# Patient Record
Sex: Female | Born: 1940
Health system: Southern US, Community
[De-identification: ages and names within clinical notes are randomized; demographics above are authoritative.]

## PROBLEM LIST (undated history)

## (undated) DIAGNOSIS — E559 Vitamin D deficiency, unspecified: Secondary | ICD-10-CM

## (undated) DIAGNOSIS — J302 Other seasonal allergic rhinitis: Secondary | ICD-10-CM

## (undated) DIAGNOSIS — G629 Polyneuropathy, unspecified: Secondary | ICD-10-CM

## (undated) DIAGNOSIS — M1990 Unspecified osteoarthritis, unspecified site: Secondary | ICD-10-CM

## (undated) DIAGNOSIS — E039 Hypothyroidism, unspecified: Secondary | ICD-10-CM

## (undated) DIAGNOSIS — H353190 Nonexudative age-related macular degeneration, unspecified eye, stage unspecified: Secondary | ICD-10-CM

## (undated) HISTORY — DX: Nonexudative age-related macular degeneration, unspecified eye, stage unspecified: H35.3190

## (undated) HISTORY — DX: Polyneuropathy, unspecified: G62.9

## (undated) HISTORY — DX: Other seasonal allergic rhinitis: J30.2

## (undated) HISTORY — PX: OTHER SURGICAL HISTORY: SHX169

## (undated) HISTORY — DX: Unspecified osteoarthritis, unspecified site: M19.90

## (undated) HISTORY — DX: Vitamin D deficiency, unspecified: E55.9

## (undated) HISTORY — DX: Hypothyroidism, unspecified: E03.9

---

## 2000-05-11 ENCOUNTER — Other Ambulatory Visit: Admission: RE | Admit: 2000-05-11 | Discharge: 2000-05-11 | Payer: Self-pay | Admitting: Family Medicine

## 2000-08-23 ENCOUNTER — Encounter: Payer: Self-pay | Admitting: Family Medicine

## 2000-08-23 ENCOUNTER — Ambulatory Visit (HOSPITAL_COMMUNITY): Admission: RE | Admit: 2000-08-23 | Discharge: 2000-08-23 | Payer: Self-pay | Admitting: Family Medicine

## 2001-06-20 ENCOUNTER — Ambulatory Visit (HOSPITAL_COMMUNITY): Admission: RE | Admit: 2001-06-20 | Discharge: 2001-06-20 | Payer: Self-pay | Admitting: Family Medicine

## 2001-06-20 ENCOUNTER — Encounter: Payer: Self-pay | Admitting: Family Medicine

## 2001-09-06 ENCOUNTER — Ambulatory Visit (HOSPITAL_COMMUNITY): Admission: RE | Admit: 2001-09-06 | Discharge: 2001-09-06 | Payer: Self-pay | Admitting: Family Medicine

## 2001-09-06 ENCOUNTER — Encounter: Payer: Self-pay | Admitting: Family Medicine

## 2002-07-09 ENCOUNTER — Encounter: Payer: Self-pay | Admitting: Internal Medicine

## 2002-07-09 ENCOUNTER — Ambulatory Visit (HOSPITAL_COMMUNITY): Admission: RE | Admit: 2002-07-09 | Discharge: 2002-07-09 | Payer: Self-pay | Admitting: Internal Medicine

## 2002-09-09 ENCOUNTER — Encounter: Payer: Self-pay | Admitting: Family Medicine

## 2002-09-09 ENCOUNTER — Ambulatory Visit (HOSPITAL_COMMUNITY): Admission: RE | Admit: 2002-09-09 | Discharge: 2002-09-09 | Payer: Self-pay | Admitting: Family Medicine

## 2003-07-16 ENCOUNTER — Ambulatory Visit (HOSPITAL_COMMUNITY): Admission: RE | Admit: 2003-07-16 | Discharge: 2003-07-16 | Payer: Self-pay | Admitting: Family Medicine

## 2003-09-30 ENCOUNTER — Ambulatory Visit (HOSPITAL_COMMUNITY): Admission: RE | Admit: 2003-09-30 | Discharge: 2003-09-30 | Payer: Self-pay | Admitting: Family Medicine

## 2004-08-25 ENCOUNTER — Ambulatory Visit (HOSPITAL_COMMUNITY): Admission: RE | Admit: 2004-08-25 | Discharge: 2004-08-25 | Payer: Self-pay | Admitting: Family Medicine

## 2004-10-25 ENCOUNTER — Ambulatory Visit (HOSPITAL_COMMUNITY): Admission: RE | Admit: 2004-10-25 | Discharge: 2004-10-25 | Payer: Self-pay | Admitting: Family Medicine

## 2005-09-27 ENCOUNTER — Ambulatory Visit (HOSPITAL_COMMUNITY): Admission: RE | Admit: 2005-09-27 | Discharge: 2005-09-27 | Payer: Self-pay | Admitting: General Surgery

## 2005-11-10 ENCOUNTER — Ambulatory Visit (HOSPITAL_COMMUNITY): Admission: RE | Admit: 2005-11-10 | Discharge: 2005-11-10 | Payer: Self-pay | Admitting: Family Medicine

## 2005-11-14 ENCOUNTER — Ambulatory Visit (HOSPITAL_COMMUNITY): Admission: RE | Admit: 2005-11-14 | Discharge: 2005-11-14 | Payer: Self-pay | Admitting: Urology

## 2006-05-08 ENCOUNTER — Ambulatory Visit: Payer: Self-pay | Admitting: Orthopedic Surgery

## 2006-12-04 ENCOUNTER — Ambulatory Visit (HOSPITAL_COMMUNITY): Admission: RE | Admit: 2006-12-04 | Discharge: 2006-12-04 | Payer: Self-pay | Admitting: Family Medicine

## 2007-12-05 ENCOUNTER — Ambulatory Visit (HOSPITAL_COMMUNITY): Admission: RE | Admit: 2007-12-05 | Discharge: 2007-12-05 | Payer: Self-pay | Admitting: Family Medicine

## 2009-01-09 ENCOUNTER — Ambulatory Visit (HOSPITAL_COMMUNITY): Admission: RE | Admit: 2009-01-09 | Discharge: 2009-01-09 | Payer: Self-pay | Admitting: Family Medicine

## 2010-02-04 ENCOUNTER — Ambulatory Visit (HOSPITAL_COMMUNITY)
Admission: RE | Admit: 2010-02-04 | Discharge: 2010-02-04 | Payer: Self-pay | Source: Home / Self Care | Attending: Family Medicine | Admitting: Family Medicine

## 2010-06-18 NOTE — H&P (Signed)
Nancy Williams, Nancy Williams                 ACCOUNT NO.:  [id number]   MEDICAL RECORD NO.:  [id number]          PATIENT TYPE:  AMB   LOCATION:                                FACILITY:  [facility]   PHYSICIAN:  Dalia Heading, M.D.  DATE OF BIRTH:  [birth year]   DATE OF ADMISSION:  DATE OF DISCHARGE:  LH                                HISTORY & PHYSICAL   CHIEF COMPLAINT:  Need for screening colonoscopy.   HISTORY OF PRESENT ILLNESS:  Patient is a 70 year old white female who is  referred for endoscopic evaluation.  She needs a colonoscopy for screening  purposes.  No abdominal pain, weight loss, nausea, vomiting, diarrhea,  constipation, melena, or hematochezia have been noted.  She has never had a  colonoscopy.  There is no family history of colon carcinoma.   Past medical history includes hypothyroidism.   PAST SURGICAL HISTORY:  Unremarkable.   CURRENT MEDICATIONS:  Synthroid, vitamin supplements, calcium supplements,  baby aspirin, Naprosyn p.r.n.   ALLERGIES:  No known drug allergies.   REVIEW OF SYSTEMS:  Noncontributory.   PHYSICAL EXAMINATION:  GENERAL:  Patient is a well-developed and well-  nourished white female in no acute distress.  LUNGS:  Clear to auscultation with equal breath sounds bilaterally.  HEART:  Regular rate and rhythm without S3, S4, or murmurs.  ABDOMEN:  Soft, nontender, nondistended.  No hepatosplenomegaly or masses  are noted.  RECTAL:  Deferred to the procedure.   IMPRESSION:  Need for screening colonoscopy.   PLAN:  Patient is scheduled for a colonoscopy on September 27, 2005.  The risks  and benefits of the procedure, including bleeding and perforation, were  fully explained to the patient, who gave informed consent.      Dalia Heading, M.D.  Electronically Signed     MAJ/MEDQ  D:  09/22/2005  T:  09/22/2005  Job:  [id number]   cc:   [facility]  Fax: [phone]   Kirk Ruths, M.D.  Fax: [phone]

## 2010-10-20 ENCOUNTER — Other Ambulatory Visit (HOSPITAL_COMMUNITY): Payer: Self-pay | Admitting: Family Medicine

## 2010-10-20 DIAGNOSIS — Z01419 Encounter for gynecological examination (general) (routine) without abnormal findings: Secondary | ICD-10-CM

## 2010-10-20 DIAGNOSIS — Z139 Encounter for screening, unspecified: Secondary | ICD-10-CM

## 2010-10-25 ENCOUNTER — Ambulatory Visit (HOSPITAL_COMMUNITY)
Admission: RE | Admit: 2010-10-25 | Discharge: 2010-10-25 | Disposition: A | Discharge status: Home / Self Care | Payer: Medicare Other | Source: Ambulatory Visit | Attending: Family Medicine | Admitting: Family Medicine

## 2010-10-25 DIAGNOSIS — Z780 Asymptomatic menopausal state: Secondary | ICD-10-CM | POA: Insufficient documentation

## 2010-10-25 DIAGNOSIS — Z13820 Encounter for screening for osteoporosis: Secondary | ICD-10-CM | POA: Insufficient documentation

## 2010-10-25 DIAGNOSIS — Z01419 Encounter for gynecological examination (general) (routine) without abnormal findings: Secondary | ICD-10-CM

## 2010-10-25 DIAGNOSIS — Z139 Encounter for screening, unspecified: Secondary | ICD-10-CM

## 2011-10-04 ENCOUNTER — Other Ambulatory Visit (HOSPITAL_COMMUNITY): Payer: Self-pay | Admitting: Internal Medicine

## 2011-10-04 DIAGNOSIS — R51 Headache: Secondary | ICD-10-CM

## 2011-10-05 ENCOUNTER — Encounter (HOSPITAL_COMMUNITY): Payer: Self-pay

## 2011-10-05 ENCOUNTER — Ambulatory Visit (HOSPITAL_COMMUNITY)
Admission: RE | Admit: 2011-10-05 | Discharge: 2011-10-05 | Disposition: A | Discharge status: Home / Self Care | Payer: Medicare Other | Source: Ambulatory Visit | Attending: Internal Medicine | Admitting: Internal Medicine

## 2011-10-05 DIAGNOSIS — R51 Headache: Secondary | ICD-10-CM | POA: Insufficient documentation

## 2011-10-05 DIAGNOSIS — S0993XA Unspecified injury of face, initial encounter: Secondary | ICD-10-CM | POA: Insufficient documentation

## 2011-10-05 DIAGNOSIS — W1809XA Striking against other object with subsequent fall, initial encounter: Secondary | ICD-10-CM | POA: Insufficient documentation

## 2011-10-06 ENCOUNTER — Other Ambulatory Visit (HOSPITAL_COMMUNITY): Payer: Medicare Other

## 2011-10-31 ENCOUNTER — Other Ambulatory Visit (HOSPITAL_COMMUNITY): Payer: Self-pay | Admitting: Family Medicine

## 2011-10-31 DIAGNOSIS — Z139 Encounter for screening, unspecified: Secondary | ICD-10-CM

## 2011-11-08 ENCOUNTER — Ambulatory Visit (HOSPITAL_COMMUNITY)
Admission: RE | Admit: 2011-11-08 | Discharge: 2011-11-08 | Disposition: A | Discharge status: Home / Self Care | Payer: Medicare Other | Source: Ambulatory Visit | Attending: Family Medicine | Admitting: Family Medicine

## 2011-11-08 DIAGNOSIS — Z139 Encounter for screening, unspecified: Secondary | ICD-10-CM

## 2011-11-08 DIAGNOSIS — Z1231 Encounter for screening mammogram for malignant neoplasm of breast: Secondary | ICD-10-CM | POA: Insufficient documentation

## 2012-02-16 ENCOUNTER — Ambulatory Visit (HOSPITAL_COMMUNITY)
Admission: RE | Admit: 2012-02-16 | Discharge: 2012-02-16 | Disposition: A | Discharge status: Home / Self Care | Payer: Medicare Other | Source: Ambulatory Visit | Attending: Orthopedic Surgery | Admitting: Orthopedic Surgery

## 2012-02-16 DIAGNOSIS — M6281 Muscle weakness (generalized): Secondary | ICD-10-CM | POA: Insufficient documentation

## 2012-02-16 DIAGNOSIS — M25519 Pain in unspecified shoulder: Secondary | ICD-10-CM | POA: Insufficient documentation

## 2012-02-16 DIAGNOSIS — IMO0001 Reserved for inherently not codable concepts without codable children: Secondary | ICD-10-CM | POA: Insufficient documentation

## 2012-02-16 NOTE — Evaluation (Signed)
Occupational Therapy Evaluation  Patient Details  Name: Nancy Williams MRN: [id number] Date of Birth: [birth year]  Today's Date: 02/16/2012 Time: 1018-1100 OT Time Calculation (min): 42 min OT eval 1018-1100 42'  Visit#: 1  of 18   Re-eval: 03/15/12  Assessment Diagnosis: Right shoulder pain, arthritis, bone spur  Authorization: [employer]  Authorization Time Period: before 10th visit  Authorization Visit#: 1  of 10    Past Medical History: No past medical history on file. Past Surgical History: No past surgical history on file.  Subjective Symptoms/Limitations Symptoms: S: I've been having pain in my shoulder for a few weeks now. Pertinent History: Nancy Williams began to experience right shoulder pain since the first of September. She has since then had difficulty and increase pain with daily tasks. Dr. Chaney Malling has referred her to  occupational therapy to  eval and treat. Limitations: reaching out to the side, increased pain, difficulty getting shirt on, reaching up in cupboards in bothersome. Special Tests: UEFI = 67/80 (84%) Patient Stated Goals: to decrease pain/discomfort in right shoulder Pain Assessment Currently in Pain?: Yes Pain Score:   4 Pain Location: Shoulder Pain Orientation: Right Pain Type: Acute pain Pain Frequency: Intermittent  Precautions/Restrictions  Precautions Precautions: None  Prior Functioning  Home Living Lives With: Alone Enjoys line dancing, knitting, and painting.  Assessment ADL/Vision/Perception ADL ADL Comments: Experiencing discomfort when getting dressed and reaching into cupboards   Additional Assessments RUE Assessment RUE Assessment:  (standing) RUE AROM (degrees) Right Shoulder Flexion: 110 Degrees Right Shoulder ABduction: 91 Degrees Right Shoulder Internal Rotation: 33 Degrees (adducted) Right Shoulder External Rotation: 46 Degrees (adducted) RUE Strength Right Shoulder Flexion: 4/5 Right Shoulder ABduction: 4/5 Right  Shoulder Internal Rotation: 4/5 Right Shoulder External Rotation: 4/5 Grip (lbs): 50 LUE Assessment LUE Assessment:  (standing) LUE AROM (degrees) Left Shoulder Flexion: 154 Degrees Left Shoulder ABduction: 145 Degrees Left Shoulder Internal Rotation: 33 Degrees (adducted) Left Shoulder External Rotation: 80 Degrees (adducted) LUE Strength Left Shoulder Flexion: 3/5 Left Shoulder ABduction: 3-/5 Left Shoulder Internal Rotation: 3+/5 Left Shoulder External Rotation: 3+/5 Grip (lbs): 50 Palpation Palpation: Min fascial restrictions in elbow and shoulder region        Occupational Therapy Assessment and Plan OT Assessment and Plan Clinical Impression Statement: Patient is a 72 year old female with decreased mobility and strength in her right shoulder and increased pain and fascial restrictions which are causing decreased functional use of LUE with daily activities. Pt will benefit from skilled therapeutic intervention in order to improve on the following deficits: Decreased activity tolerance;Decreased range of motion;Decreased strength;Increased fascial restricitons;Impaired UE functional use Rehab Potential: Excellent OT Frequency: Min 3X/week OT Duration: 6 weeks OT Treatment/Interventions: Self-care/ADL training;Therapeutic exercise;Therapeutic activities;Manual therapy;Modalities;Patient/family education OT Plan: Skilled OT intervention to decrease pain and restrictions and increase pain free mobility in right shoulder for increased I with all B/IADLs, work, and leisure activities.  Treatment Plan:  MFR and manual stretching to right shoulder region.  PROM and AAROM in supine, progress to AROM. serratus anterior punch and proximal shoulder strengthening  seated ext, ret, row, ball stretches, pulleys, scapular stability exercises, progress as tolerated.     Goals Short Term Goals Time to Complete Short Term Goals: 3 weeks Short Term Goal 1: Patient will be educated on HEP. Short  Term Goal 2: Patient will increase right shoulder AROM by 10 for increased ability to comb hair. Short Term Goal 3: Patient will increase right shoulder strength to 4-/5 for increased ability to lift bags of  groceries. Short Term Goal 4: Patient will decrease pain in her right shoulder region to 2/10 when attempting to donn shirt. Short Term Goal 5: Patient will decrease fascial restrictions to trace in her right shoulder region. Long Term Goals Time to Complete Long Term Goals: 6 weeks Long Term Goal 1: Patient will return to prior level of I with all B/IADL, work, and leisure activities. Long Term Goal 2: Patient will increase right shoulder AROM to WNL for increased ability to comb her hair. Long Term Goal 3: Patient will increase left shoulder strength to 5/5 for increased ability to lift bags of groceries. Long Term Goal 4: Patient will decrease pain in her right shoulder region to 1/10 when attempting to donn shirt. Long Term Goal 5: Patient will decrease fascial restrictions to zero in her right shoulder region.  Problem List Patient Active Problem List  Diagnosis  . Pain in joint, shoulder region    End of Session Activity Tolerance: Patient tolerated treatment well General Behavior During Session: Nancy Williams for tasks performed Cognition: Nancy Williams for tasks performed OT Plan of Care OT Home Exercise Plan: cervical and shoulder stretches OT Patient Instructions: handout Consulted and Agree with Plan of Care: Patient  GO Functional Assessment Tool Used: observation Functional Limitation: Self care Self Care Current Status (Z6109): At least 20 percent but less than 40 percent impaired, limited or restricted Self Care Goal Status (U0454): At least 1 percent but less than 20 percent impaired, limited or restricted  Limmie Patricia, OTR/L 02/16/2012, 3:34 PM  Physician Documentation Your signature is required to indicate approval of the treatment plan as stated above.  Please sign and  either send electronically or make a copy of this report for your files and return this physician signed original.  Please mark one 1.__approve of plan  2. ___approve of plan with the following conditions.   ______________________________                                                          _____________________ Physician Signature                                                                                                             Date

## 2012-02-21 ENCOUNTER — Ambulatory Visit (HOSPITAL_COMMUNITY)
Admission: RE | Admit: 2012-02-21 | Discharge: 2012-02-21 | Disposition: A | Discharge status: Home / Self Care | Payer: Medicare Other | Source: Ambulatory Visit | Attending: Family Medicine | Admitting: Family Medicine

## 2012-02-21 DIAGNOSIS — M6281 Muscle weakness (generalized): Secondary | ICD-10-CM | POA: Insufficient documentation

## 2012-02-21 NOTE — Progress Notes (Signed)
Occupational Therapy Treatment Patient Details  Name: BERTRICE LEDER MRN: [id number] Date of Birth: [birth year]  Today's Date: 02/21/2012 Time: 7829-5621 OT Time Calculation (min): 36 min Manual Therapy 1310-1326 16' Therapeutic Exercises [phone] 20'  Visit#: 2  of 18   Re-eval: 03/15/12    Authorization: [employer]   Authorization Time Period: before 10th visit   Authorization Visit#: 2  of 10   Subjective S:  I tried the exercises and they are going ok.  It feels about the same. Pain Assessment Currently in Pain?: Yes Pain Score:   5 Pain Location: Shoulder Pain Orientation: Right Pain Type: Acute pain  Precautions/Restrictions   progress as tolerated  Exercise/Treatments Supine Protraction: PROM;AAROM;10 reps Horizontal ABduction: PROM;AAROM;10 reps External Rotation: PROM;AAROM;10 reps Internal Rotation: PROM;AAROM;10 reps Flexion: PROM;AAROM;10 reps ABduction: PROM;AAROM;10 reps Other Supine Exercises: serratus anterior punch x 10 with vg to slow rate of exercise Therapy Ball Flexion: 20 reps ABduction: 20 reps Right/Left: 5 reps ROM / Strengthening / Isometric Strengthening Thumb Tacks: 1' Prot/Ret//Elev/Dep: 1'      Manual Therapy Manual Therapy: Myofascial release Myofascial Release: MFR and manual stretching to right upper arm, scapular, trapezius, SCM, and shoulder region to decrease pain and fascial restrictions and increase pain free mobility in her right arm.  [id number]  Occupational Therapy Assessment and Plan OT Assessment and Plan Clinical Impression Statement: A:  Patient limited to 60% flexion, abduction and 75% ext rotation PROM in supine.  Requries vg with exercises for slowing rate of completion. OT Plan: P:  Add AAROM in seated, active scapular exercises, such as ext, ret, row, ER/IR for increased positioning in scapula for less pain in shoulder.    Goals Short Term Goals Time to Complete Short Term Goals: 3 weeks Short Term Goal 1:  Patient will be educated on HEP. Short Term Goal 1 Progress: Progressing toward goal Short Term Goal 2: Patient will increase right shoulder AROM by 10 for increased ability to comb hair. Short Term Goal 2 Progress: Progressing toward goal Short Term Goal 3: Patient will increase right shoulder strength to 4-/5 for increased ability to lift bags of groceries. Short Term Goal 3 Progress: Progressing toward goal Short Term Goal 4: Patient will decrease pain in her right shoulder region to 2/10 when attempting to donn shirt. Short Term Goal 4 Progress: Progressing toward goal Short Term Goal 5: Patient will decrease fascial restrictions to moderate in her right shoulder region. Short Term Goal 5 Progress: Progressing toward goal Long Term Goals Time to Complete Long Term Goals: 6 weeks Long Term Goal 1: Patient will return to prior level of I with all B/IADL, work, and leisure activities. Long Term Goal 1 Progress: Progressing toward goal Long Term Goal 2: Patient will increase right shoulder AROM to WNL for increased ability to comb her hair. Long Term Goal 2 Progress: Progressing toward goal Long Term Goal 3: Patient will increase left shoulder strength to 5/5 for increased ability to lift bags of groceries. Long Term Goal 3 Progress: Progressing toward goal Long Term Goal 4: Patient will decrease pain in her right shoulder region to 1/10 when attempting to donn shirt. Long Term Goal 4 Progress: Progressing toward goal Long Term Goal 5: Patient will decrease fascial restrictions to zero in her right shoulder region. Long Term Goal 5 Progress: Progressing toward goal  Problem List Patient Active Problem List  Diagnosis  . Pain in joint, shoulder region  . Muscle weakness (generalized)    End of Session Activity Tolerance:  Patient tolerated treatment well General Behavior During Session: [facility] for tasks performed Cognition: [facility] for tasks performed  GO    Shirlean Mylar,  OTR/L  02/21/2012, 1:52 PM

## 2012-02-23 ENCOUNTER — Ambulatory Visit (HOSPITAL_COMMUNITY)
Admission: RE | Admit: 2012-02-23 | Discharge: 2012-02-23 | Disposition: A | Discharge status: Home / Self Care | Payer: Medicare Other | Source: Ambulatory Visit | Attending: Orthopedic Surgery | Admitting: Orthopedic Surgery

## 2012-02-23 DIAGNOSIS — M6281 Muscle weakness (generalized): Secondary | ICD-10-CM

## 2012-02-23 NOTE — Progress Notes (Signed)
Occupational Therapy Treatment Patient Details  Name: Nancy Williams MRN: [id number] Date of Birth: 05/06/1940  Today's Date: 02/23/2012 Time: 4098-1191 OT Time Calculation (min): 48 min MFR 4782-9562 10' Therex 1308-6578 38'  Visit#: 3  of 18   Re-eval: 03/15/12    Authorization: [employer]  Authorization Time Period: before 10th visit   Authorization Visit#: 3  of 10   Subjective Symptoms/Limitations Symptoms: S: My shoulder is feeling better. Pain Assessment Currently in Pain?: Yes Pain Score:   3 Pain Location: Shoulder Pain Orientation: Right Pain Type: Acute pain  Precautions/Restrictions  Precautions Precautions: None  Exercise/Treatments Supine Protraction: PROM;AAROM;10 reps Horizontal ABduction: PROM;AAROM;10 reps External Rotation: PROM;AAROM;10 reps Internal Rotation: PROM;AAROM;10 reps Flexion: PROM;AAROM;10 reps ABduction: PROM;AAROM;10 reps Other Supine Exercises: serratus anterior punch x 10 with vg to slow rate of exercise Seated Elevation: AROM;10 reps Extension: AROM;10 reps Row: AROM;10 reps Protraction: AAROM;10 reps Horizontal ABduction: AAROM;10 reps External Rotation: AAROM;10 reps Internal Rotation: AAROM;10 reps Flexion: AAROM;10 reps Abduction: AAROM;10 reps Therapy Ball Flexion: 20 reps ABduction: 20 reps    Manual Therapy Manual Therapy: Myofascial release Myofascial Release: MFR and manual stretching to right upper arm, scapular, trapezius, SCM, and shoulder region to decrease pain and fascial restrictions and increase pain free mobility in her right arm.   Occupational Therapy Assessment and Plan OT Assessment and Plan Clinical Impression Statement: A: AROM in supine has increased. Required min physical assist for technique during exercises. OT Plan: P: Add elev/pro/retra/dep at wall.   Goals Short Term Goals Time to Complete Short Term Goals: 3 weeks Short Term Goal 1: Patient will be educated on HEP. Short Term Goal  2: Patient will increase right shoulder AROM by 10 for increased ability to comb hair. Short Term Goal 3: Patient will increase right shoulder strength to 4-/5 for increased ability to lift bags of groceries. Short Term Goal 4: Patient will decrease pain in her right shoulder region to 2/10 when attempting to donn shirt. Short Term Goal 5: Patient will decrease fascial restrictions to moderate in her right shoulder region. Long Term Goals Time to Complete Long Term Goals: 6 weeks Long Term Goal 1: Patient will return to prior level of I with all B/IADL, work, and leisure activities. Long Term Goal 2: Patient will increase right shoulder AROM to WNL for increased ability to comb her hair. Long Term Goal 3: Patient will increase left shoulder strength to 5/5 for increased ability to lift bags of groceries. Long Term Goal 4: Patient will decrease pain in her right shoulder region to 1/10 when attempting to donn shirt. Long Term Goal 5: Patient will decrease fascial restrictions to zero in her right shoulder region.  Problem List Patient Active Problem List  Diagnosis  . Pain in joint, shoulder region  . Muscle weakness (generalized)    End of Session Activity Tolerance: Patient tolerated treatment well General Behavior During Session: [facility] for tasks performed Cognition: [facility] for tasks performed   Limmie Patricia, OTR/L 02/23/2012, 11:53 AM

## 2012-02-27 ENCOUNTER — Ambulatory Visit (HOSPITAL_COMMUNITY)
Admission: RE | Admit: 2012-02-27 | Discharge: 2012-02-27 | Disposition: A | Discharge status: Home / Self Care | Payer: Medicare Other | Source: Ambulatory Visit | Attending: Orthopedic Surgery | Admitting: Orthopedic Surgery

## 2012-02-27 NOTE — Progress Notes (Signed)
Occupational Therapy Treatment Patient Details  Name: Nancy Williams MRN: [id number] Date of Birth: 01-25-1941  Today's Date: 02/27/2012 Time: 1020-1110 OT Time Calculation (min): 50 min Manual Therapy 1020-1050 30' Therapeutic Exercises 1050-1110 20' Visit#: 4  of 18   Re-eval: 03/15/12    Authorization: [employer]   Authorization Time Period: before 10th visit   Authorization Visit#: 4  of 10   Subjective  S:  It feels ok, what can I do to work this at home.  Educated on dowel rod exercises.    Precautions/Restrictions   progress as tolerated  Exercise/Treatments Supine Protraction: PROM;10 reps;AAROM;15 reps Horizontal ABduction: PROM;10 reps;AAROM;15 reps External Rotation: PROM;10 reps;AAROM;15 reps Internal Rotation: PROM;10 reps;AAROM;15 reps Flexion: PROM;10 reps;AAROM;15 reps ABduction: PROM;10 reps;AAROM;15 reps Seated Protraction: AROM;10 reps Horizontal ABduction: AROM;10 reps External Rotation: AAROM;15 reps Internal Rotation: AAROM;15 reps Flexion: AAROM;15 reps Abduction: AAROM;15 reps Therapy Ball Flexion: 25 reps ABduction: 25 reps ROM / Strengthening / Isometric Strengthening Thumb Tacks: 1' Prot/Ret//Elev/Dep: 1'      Manual Therapy Manual Therapy: Myofascial release Myofascial Release: MFR and manual stretching to right upper arm, scapular, trapezius, SCM, and shoulder region to decrease pain and fascial restrictions and increase pain free mobility in her right arm  Occupational Therapy Assessment and Plan OT Assessment and Plan Clinical Impression Statement: A:  AAROM in supine is improving. Pain and tightness at endrange (80% and above) of all supine shoulder movements.  OT Plan: P: Add AROM of protraction, horizontal abduction, Improve to 90% PROM.   Goals Short Term Goals Time to Complete Short Term Goals: 3 weeks Short Term Goal 1: Patient will be educated on HEP. Short Term Goal 1 Progress: Progressing toward goal Short Term Goal  2: Patient will increase right shoulder AROM by 10 for increased ability to comb hair. Short Term Goal 2 Progress: Progressing toward goal Short Term Goal 3: Patient will increase right shoulder strength to 4-/5 for increased ability to lift bags of groceries. Short Term Goal 3 Progress: Progressing toward goal Short Term Goal 4: Patient will decrease pain in her right shoulder region to 2/10 when attempting to donn shirt. Short Term Goal 4 Progress: Progressing toward goal Short Term Goal 5: Patient will decrease fascial restrictions to moderate in her right shoulder region. Short Term Goal 5 Progress: Progressing toward goal Long Term Goals Time to Complete Long Term Goals: 6 weeks Long Term Goal 1: Patient will return to prior level of I with all B/IADL, work, and leisure activities. Long Term Goal 1 Progress: Progressing toward goal Long Term Goal 2: Patient will increase right shoulder AROM to WNL for increased ability to comb her hair. Long Term Goal 2 Progress: Progressing toward goal Long Term Goal 3: Patient will increase left shoulder strength to 5/5 for increased ability to lift bags of groceries. Long Term Goal 3 Progress: Progressing toward goal Long Term Goal 4: Patient will decrease pain in her right shoulder region to 1/10 when attempting to donn shirt. Long Term Goal 4 Progress: Progressing toward goal Long Term Goal 5: Patient will decrease fascial restrictions to zero in her right shoulder region. Long Term Goal 5 Progress: Progressing toward goal  Problem List Patient Active Problem List  Diagnosis  . Pain in joint, shoulder region  . Muscle weakness (generalized)    End of Session Activity Tolerance: Patient tolerated treatment well General Behavior During Session: [facility] for tasks performed Cognition: [facility] for tasks performed  Shirlean Mylar, OTR/L  02/27/2012, 11:36 AM

## 2012-02-29 ENCOUNTER — Ambulatory Visit (HOSPITAL_COMMUNITY): Payer: Medicare Other | Admitting: Specialist

## 2012-03-02 ENCOUNTER — Ambulatory Visit (HOSPITAL_COMMUNITY)
Admission: RE | Admit: 2012-03-02 | Discharge: 2012-03-02 | Disposition: A | Discharge status: Home / Self Care | Payer: Medicare Other | Source: Ambulatory Visit | Attending: Orthopedic Surgery | Admitting: Orthopedic Surgery

## 2012-03-02 DIAGNOSIS — M6281 Muscle weakness (generalized): Secondary | ICD-10-CM

## 2012-03-02 NOTE — Progress Notes (Signed)
Occupational Therapy Treatment Patient Details  Name: Nancy Williams MRN: [id number] Date of Birth: March 25, 1940  Today's Date: 03/02/2012 Time: 1021-1059 OT Time Calculation (min): 38 min Manual Therapy 1021-1040 19' Therapeutic Exercises 1040-1059 19' Visit#: 5  of 18   Re-eval: 03/15/12    Authorization: [employer]  Authorization Time Period: before 10th visit  Authorization Visit#: 5  of 10   Subjective S:  Im using a yard stick at home, that helps.  Pain Assessment Currently in Pain?: No/denies Pain Score: 0-No pain  Precautions/Restrictions   progress as tolerated  Exercise/Treatments Supine Protraction: PROM;AROM;10 reps Horizontal ABduction: PROM;AROM;10 reps External Rotation: PROM;AROM;10 reps Internal Rotation: PROM;AROM;10 reps Flexion: PROM;AROM;10 reps ABduction: PROM;AROM;10 reps Seated Extension: Theraband;15 reps Theraband Level (Shoulder Extension): Level 2 (Red) Retraction: Theraband;15 reps Theraband Level (Shoulder Retraction): Level 2 (Red) Row: Theraband;15 reps Theraband Level (Shoulder Row): Level 2 (Red) Protraction: AROM;10 reps Horizontal ABduction: AROM;10 reps;Limitations Horizontal ABduction Limitations: vg and tactile cuing to maintain depressed shoulder blade External Rotation: AROM;10 reps;Theraband;15 reps Theraband Level (Shoulder External Rotation): Level 2 (Red) Internal Rotation: AROM;10 reps;Theraband;15 reps Theraband Level (Shoulder Internal Rotation): Level 2 (Red) Flexion: AAROM;15 reps Abduction: AAROM;15 reps Therapy Ball ABduction: 15 reps Right/Left: 5 reps ROM / Strengthening / Isometric Strengthening Wall Wash: 2' Thumb Tacks: dc Prot/Ret//Elev/Dep: dc      Manual Therapy Manual Therapy: Myofascial release Myofascial Release: MFR and manual stretching to right upper arm, scapular, trapezius, SCM, and shoulder region to decrease pain and fascial restrictions and increase pain free mobility in her right arm    Occupational Therapy Assessment and Plan OT Assessment and Plan Clinical Impression Statement: A:  Added AROM in supine all ranges, required assistance with holding weight of arm through abduction.  Also able to complete AROM in seated for protraction, horizontal abduction and ER/IR.   OT Plan: P:  Add flexion and abduction AROM in seated as AAROM improved to [facility].     Goals Short Term Goals Time to Complete Short Term Goals: 3 weeks Short Term Goal 1: Patient will be educated on HEP. Short Term Goal 2: Patient will increase right shoulder AROM by 10 for increased ability to comb hair. Short Term Goal 3: Patient will increase right shoulder strength to 4-/5 for increased ability to lift bags of groceries. Short Term Goal 4: Patient will decrease pain in her right shoulder region to 2/10 when attempting to donn shirt. Short Term Goal 5: Patient will decrease fascial restrictions to moderate in her right shoulder region. Long Term Goals Time to Complete Long Term Goals: 6 weeks Long Term Goal 1: Patient will return to prior level of I with all B/IADL, work, and leisure activities. Long Term Goal 2: Patient will increase right shoulder AROM to WNL for increased ability to comb her hair. Long Term Goal 3: Patient will increase left shoulder strength to 5/5 for increased ability to lift bags of groceries. Long Term Goal 4: Patient will decrease pain in her right shoulder region to 1/10 when attempting to donn shirt. Long Term Goal 5: Patient will decrease fascial restrictions to zero in her right shoulder region.  Problem List Patient Active Problem List  Diagnosis  . Pain in joint, shoulder region  . Muscle weakness (generalized)    End of Session Activity Tolerance: Patient tolerated treatment well General Behavior During Session: [facility] for tasks performed Cognition: [facility] for tasks performed   Shirlean Mylar, OTR/L  03/02/2012, 11:41 AM

## 2012-03-05 ENCOUNTER — Ambulatory Visit (HOSPITAL_COMMUNITY)
Admission: RE | Admit: 2012-03-05 | Discharge: 2012-03-05 | Disposition: A | Discharge status: Home / Self Care | Payer: Medicare Other | Source: Ambulatory Visit | Attending: Orthopedic Surgery | Admitting: Orthopedic Surgery

## 2012-03-05 DIAGNOSIS — IMO0001 Reserved for inherently not codable concepts without codable children: Secondary | ICD-10-CM | POA: Insufficient documentation

## 2012-03-05 DIAGNOSIS — M25519 Pain in unspecified shoulder: Secondary | ICD-10-CM | POA: Insufficient documentation

## 2012-03-05 DIAGNOSIS — M6281 Muscle weakness (generalized): Secondary | ICD-10-CM

## 2012-03-05 NOTE — Progress Notes (Signed)
Occupational Therapy Treatment Patient Details  Name: Nancy Williams MRN: [id number] Date of Birth: 1940-04-13  Today's Date: 03/05/2012 Time: 4098-1191 OT Time Calculation (min): 48 min Manual Therapy 4782-9562 14' Therapeutic Exercises [phone] 21' Visit#: 6  of 18   Re-eval: 03/15/12    Authorization: [employer]   Authorization Time Period: before 10th visit   Authorization Visit#: 6  of 10   Subjective Symptoms/Limitations Symptoms: S:  How do you think I am progressing.  Pain Assessment Currently in Pain?: No/denies Pain Score: 0-No pain  Precautions/Restrictions     Exercise/Treatments Supine Protraction: PROM;10 reps;AROM;15 reps Horizontal ABduction: PROM;10 reps;AROM;15 reps External Rotation: PROM;10 reps;AROM;15 reps Internal Rotation: 10 reps;PROM;AROM;15 reps Flexion: PROM;10 reps;AROM;15 reps ABduction: PROM;10 reps;AROM;15 reps Seated Extension: Theraband;10 reps Theraband Level (Shoulder Extension): Level 3 (Green) Retraction: Theraband;10 reps Theraband Level (Shoulder Retraction): Level 3 (Green) Row: Theraband;10 reps Theraband Level (Shoulder Row): Level 3 (Green) Protraction: AROM;15 reps Horizontal ABduction: AROM;15 reps External Rotation: AROM;15 reps;Theraband;10 reps Theraband Level (Shoulder External Rotation): Level 3 (Green) Internal Rotation: Theraband;10 reps;AROM;15 reps Theraband Level (Shoulder Internal Rotation): Level 3 (Green) Flexion: AROM;15 reps Abduction: AROM;15 reps;Limitations ABduction Limitations: patient able to complete, but difficult Therapy Ball ABduction: 15 reps Right/Left: 5 reps ROM / Strengthening / Isometric Strengthening Wall Wash: 2' Thumb Tacks: 3'      Manual Therapy Manual Therapy: Myofascial release Myofascial Release: MFR and manual stretching to right upper arm, scapular, trapezius, SCM, and shoulder region to decrease pain and fascial restrictions and increase pain free mobility in her right  arm  Occupational Therapy Assessment and Plan OT Assessment and Plan Clinical Impression Statement: A:  Increased to green tband this date, as red tband was not challenging.  Able to complete AROM exercises in supine through full range, except for ER and abd. OT Plan: P:  Attempt strengthening in supine, add x t o v and w arms for scapular stabilization   Goals Short Term Goals Time to Complete Short Term Goals: 3 weeks Short Term Goal 1: Patient will be educated on HEP. Short Term Goal 1 Progress: Progressing toward goal Short Term Goal 2: Patient will increase right shoulder AROM by 10 for increased ability to comb hair. Short Term Goal 2 Progress: Progressing toward goal Short Term Goal 3: Patient will increase right shoulder strength to 4-/5 for increased ability to lift bags of groceries. Short Term Goal 3 Progress: Progressing toward goal Short Term Goal 4: Patient will decrease pain in her right shoulder region to 2/10 when attempting to donn shirt. Short Term Goal 4 Progress: Progressing toward goal Short Term Goal 5: Patient will decrease fascial restrictions to moderate in her right shoulder region. Short Term Goal 5 Progress: Progressing toward goal Long Term Goals Time to Complete Long Term Goals: 6 weeks Long Term Goal 1: Patient will return to prior level of I with all B/IADL, work, and leisure activities. Long Term Goal 1 Progress: Progressing toward goal Long Term Goal 2: Patient will increase right shoulder AROM to WNL for increased ability to comb her hair. Long Term Goal 2 Progress: Progressing toward goal Long Term Goal 3: Patient will increase left shoulder strength to 5/5 for increased ability to lift bags of groceries. Long Term Goal 3 Progress: Progressing toward goal Long Term Goal 4: Patient will decrease pain in her right shoulder region to 1/10 when attempting to donn shirt. Long Term Goal 4 Progress: Progressing toward goal Long Term Goal 5: Patient will  decrease fascial restrictions to  zero in her right shoulder region. Long Term Goal 5 Progress: Progressing toward goal  Problem List Patient Active Problem List  Diagnosis  . Pain in joint, shoulder region  . Muscle weakness (generalized)    End of Session Activity Tolerance: Patient tolerated treatment well General Behavior During Session: [facility] for tasks performed Cognition: [facility] for tasks performed  GO    Shirlean Mylar, OTR/L  03/05/2012, 11:25 AM

## 2012-03-07 ENCOUNTER — Ambulatory Visit (HOSPITAL_COMMUNITY): Payer: Medicare Other | Admitting: Specialist

## 2012-03-09 ENCOUNTER — Ambulatory Visit (HOSPITAL_COMMUNITY)
Admission: RE | Admit: 2012-03-09 | Discharge: 2012-03-09 | Disposition: A | Discharge status: Home / Self Care | Payer: Medicare Other | Source: Ambulatory Visit | Attending: Orthopedic Surgery | Admitting: Orthopedic Surgery

## 2012-03-09 DIAGNOSIS — M6281 Muscle weakness (generalized): Secondary | ICD-10-CM

## 2012-03-09 NOTE — Progress Notes (Signed)
Occupational Therapy Treatment Patient Details  Name: Nancy Williams MRN: [id number] Date of Birth: [birth year]  Today's Date: 03/09/2012 Time: 4098-1191 OT Time Calculation (min): 50 min Manual Therapy 4782-9562 25' Therapeutic Exercises 1308-6578 [phone] no charge secondary to supervised by OTT) 11' Visit#: 7  of 18   Re-eval: 03/15/12    Authorization: [employer]   Authorization Time Period: before 10th visit   Authorization Visit#: 7  of 10   Subjective S:  I really just have pain at night, it doesnt wake me up, just makes it difficult to get to sleep. (I recommended heating and positioning with a pillow prior to going to sleep) Pain Assessment Currently in Pain?: No/denies Pain Score: 0-No pain  Precautions/Restrictions   progress as tolerated  Exercise/Treatments Supine Protraction: PROM;10 reps Horizontal ABduction: PROM;10 reps External Rotation: PROM;10 reps Internal Rotation: PROM;10 reps Flexion: PROM;10 reps ABduction: PROM;10 reps Seated Extension: Theraband;10 reps Theraband Level (Shoulder Extension): Level 3 (Green) Retraction: Theraband;10 reps Theraband Level (Shoulder Retraction): Level 3 (Green) Row: Theraband;10 reps Theraband Level (Shoulder Row): Level 3 (Green) Protraction: AROM;15 reps Horizontal ABduction: AROM;15 reps External Rotation: AROM;15 reps;Theraband;10 reps Theraband Level (Shoulder External Rotation): Level 3 (Green) Internal Rotation: Theraband;10 reps;AROM;15 reps Theraband Level (Shoulder Internal Rotation): Level 3 (Green) Flexion: AROM;15 reps Abduction: AROM;15 reps;Limitations ABduction Limitations: patient able to complete, but difficult Therapy Ball ABduction: 15 reps Right/Left: 5 reps ROM / Strengthening / Isometric Strengthening UBE (Upper Arm Bike): 2' and 2' 1.0 Wall Wash: 2' Thumb Tacks: dc Prot/Ret//Elev/Dep: dc      Manual Therapy Manual Therapy: Myofascial release Myofascial Release: MFR and manual  stretching to right upper arm, scapular, trapezius, SCM, and shoulder region to decrease pain and fascial restrictions and increase pain free mobility in her right arm   Occupational Therapy Assessment and Plan OT Assessment and Plan Clinical Impression Statement: A:  Continues to require facilitation for full retraction in scapula with exercises.  Added UBE for increased mobility in her right shoulder region.  OT Plan: P:  Attempt strengthening in supine.  follow up on use of heat and positioning at night for pain relief.   Goals Short Term Goals Time to Complete Short Term Goals: 3 weeks Short Term Goal 1: Patient will be educated on HEP. Short Term Goal 2: Patient will increase right shoulder AROM by 10 for increased ability to comb hair. Short Term Goal 3: Patient will increase right shoulder strength to 4-/5 for increased ability to lift bags of groceries. Short Term Goal 4: Patient will decrease pain in her right shoulder region to 2/10 when attempting to donn shirt. Short Term Goal 5: Patient will decrease fascial restrictions to moderate in her right shoulder region. Long Term Goals Time to Complete Long Term Goals: 6 weeks Long Term Goal 1: Patient will return to prior level of I with all B/IADL, work, and leisure activities. Long Term Goal 2: Patient will increase right shoulder AROM to WNL for increased ability to comb her hair. Long Term Goal 3: Patient will increase left shoulder strength to 5/5 for increased ability to lift bags of groceries. Long Term Goal 4: Patient will decrease pain in her right shoulder region to 1/10 when attempting to donn shirt. Long Term Goal 5: Patient will decrease fascial restrictions to zero in her right shoulder region.  Problem List Patient Active Problem List  Diagnosis  . Pain in joint, shoulder region  . Muscle weakness (generalized)    End of Session Activity Tolerance: Patient tolerated  treatment well General Behavior During Session:  [facility] for tasks performed Cognition: [facility] for tasks performed  GO    Shirlean Mylar, OTR/L  03/09/2012, 12:01 PM

## 2012-03-12 ENCOUNTER — Ambulatory Visit (HOSPITAL_COMMUNITY)
Admission: RE | Admit: 2012-03-12 | Discharge: 2012-03-12 | Disposition: A | Discharge status: Home / Self Care | Payer: Medicare Other | Source: Ambulatory Visit | Attending: Orthopedic Surgery | Admitting: Orthopedic Surgery

## 2012-03-12 DIAGNOSIS — M6281 Muscle weakness (generalized): Secondary | ICD-10-CM

## 2012-03-12 NOTE — Progress Notes (Signed)
Occupational Therapy Treatment Patient Details  Name: Nancy Williams MRN: [id number] Date of Birth: [birth year]  Today's Date: 03/12/2012 Time: 1012-1100 OT Time Calculation (min): 48 min Manual Therapy 4098-1191 19' Therapeutic Exercises 1031-1100 29' Visit#: 8 of 18  Re-eval: 03/15/12    Authorization: [employer]   Authorization Time Period: before 10th visit   Authorization Visit#: 8 of 10  Subjective  S:  I painted this weekend, it didnt bother my shoulder too much.  I think the heating pad is working.   Precautions/Restrictions   progress as tolerated  Exercise/Treatments Supine Protraction: PROM;10 reps Horizontal ABduction: PROM;10 reps;AAROM;5 reps External Rotation: PROM;10 reps;AAROM;5 reps Internal Rotation: PROM;10 reps;AAROM;5 reps Flexion: PROM;10 reps;AAROM;5 reps ABduction: PROM;10 reps;AAROM;5 reps Therapy Ball Flexion: 20 reps ABduction: 20 reps Right/Left: 5 reps ROM / Strengthening / Isometric Strengthening UBE (Upper Arm Bike): 2' and 2' 1.0 Wall Wash: 2' focusing on stretching into end range of shoulder  "W" Arms: 10 with vg for technique X to V Arms: 10 with vg for techique      Manual Therapy Manual Therapy: Myofascial release Myofascial Release: MFR and manual stretching to right upper arm, shoulder region, scapular region, trapezius region to decrease pain and fascial restriction and increase pain free mobility in her right upper extremity.  [id number]  Occupational Therapy Assessment and Plan OT Assessment and Plan Clinical Impression Statement: A:  Patient with increased pain and stiffness in lateral upper arm this date, shfited focus to stretching, muscle lengthening rather than strengthening as we had been focusing on in attempts to regain lost mobility. OT Plan: P:  Reassess, begin strengthening in supine and shift focus back to scapular stability and strengthening if tightness resolves.   Goals Short Term Goals Time to Complete Short  Term Goals: 3 weeks Short Term Goal 1: Patient will be educated on HEP. Short Term Goal 1 Progress: Progressing toward goal Short Term Goal 2: Patient will increase right shoulder AROM by 10 for increased ability to comb hair. Short Term Goal 2 Progress: Progressing toward goal Short Term Goal 3: Patient will increase right shoulder strength to 4-/5 for increased ability to lift bags of groceries. Short Term Goal 3 Progress: Progressing toward goal Short Term Goal 4: Patient will decrease pain in her right shoulder region to 2/10 when attempting to donn shirt. Short Term Goal 4 Progress: Progressing toward goal Short Term Goal 5: Patient will decrease fascial restrictions to moderate in her right shoulder region. Long Term Goals Time to Complete Long Term Goals: 6 weeks Long Term Goal 1: Patient will return to prior level of I with all B/IADL, work, and leisure activities. Long Term Goal 1 Progress: Progressing toward goal Long Term Goal 2: Patient will increase right shoulder AROM to WNL for increased ability to comb her hair. Long Term Goal 2 Progress: Progressing toward goal Long Term Goal 3: Patient will increase left shoulder strength to 5/5 for increased ability to lift bags of groceries. Long Term Goal 3 Progress: Progressing toward goal Long Term Goal 4: Patient will decrease pain in her right shoulder region to 1/10 when attempting to donn shirt. Long Term Goal 4 Progress: Progressing toward goal Long Term Goal 5: Patient will decrease fascial restrictions to zero in her right shoulder region. Long Term Goal 5 Progress: Progressing toward goal  Problem List Patient Active Problem List  Diagnosis  . Pain in joint, shoulder region  . Muscle weakness (generalized)   Shirlean Mylar, OTR/L   03/12/2012, 11:01 AM

## 2012-03-14 ENCOUNTER — Ambulatory Visit (HOSPITAL_COMMUNITY): Payer: Medicare Other

## 2012-03-16 ENCOUNTER — Ambulatory Visit (HOSPITAL_COMMUNITY): Payer: Medicare Other

## 2012-03-19 ENCOUNTER — Ambulatory Visit (HOSPITAL_COMMUNITY)
Admission: RE | Admit: 2012-03-19 | Discharge: 2012-03-19 | Disposition: A | Discharge status: Home / Self Care | Payer: Medicare Other | Source: Ambulatory Visit | Attending: Orthopedic Surgery | Admitting: Orthopedic Surgery

## 2012-03-19 NOTE — Progress Notes (Addendum)
Occupational Therapy Treatment Patient Details  Name: Nancy Williams MRN: [id number] Date of Birth: [birth year]  Today's Date: 03/19/2012 Time: 8413-2440 OT Time Calculation (min): 33 min Manual Therapy [phone] 16' Reassessment [phone] Visit#:   of    Re-eval: 04/16/12    Authorization: [employer]   Authorization Time Period: before 19th visit  Authorization Visit#:   of    Subjective Symptoms/Limitations Symptoms: S:  I feel like I can move my arm better, but the pain is still there. Special Tests: UEFI was 84% and is currently 96%.   Pain Assessment Currently in Pain?: Yes Pain Score:   4 Pain Location: Shoulder Pain Orientation: Right Pain Type: Acute pain  Precautions/Restrictions   progress as tolerated  Exercise/Treatments    Manual Therapy Manual Therapy: Myofascial release Myofascial Release: MFR and manual stretching to right upper arm, shoulder region, trapezius region to decrease pain and fascial restrictions and increase pain fee mobility in her right upper extremity.   Occupational Therapy Assessment and Plan OT Assessment and Plan Clinical Impression Statement: please see progress note.  OT Plan: P:  Begin strengthening in supine and focus on scapular stability.    Update - after patient went to MD, he recommended DC to HEP.   Goals Short Term Goals Time to Complete Short Term Goals: 3 weeks Short Term Goal 1: Patient will be educated on HEP. Short Term Goal 2: Patient will increase right shoulder AROM by 10 for increased ability to comb hair. Short Term Goal 3: Patient will increase right shoulder strength to 4-/5 for increased ability to lift bags of groceries. Short Term Goal 4: Patient will decrease pain in her right shoulder region to 2/10 when attempting to donn shirt. Short Term Goal 5: Patient will decrease fascial restrictions to moderate in her right shoulder region. Long Term Goals Time to Complete Long Term Goals: 6 weeks Long Term Goal 1:  Patient will return to prior level of I with all B/IADL, work, and leisure activities. Long Term Goal 2: Patient will increase right shoulder AROM to WNL for increased ability to comb her hair. Long Term Goal 3: Patient will increase left shoulder strength to 5/5 for increased ability to lift bags of groceries. Long Term Goal 4: Patient will decrease pain in her right shoulder region to 1/10 when attempting to donn shirt. Long Term Goal 5: Patient will decrease fascial restrictions to zero in her right shoulder region.  Problem List Patient Active Problem List  Diagnosis  . Pain in joint, shoulder region  . Muscle weakness (generalized)       GO Functional Limitation: Self care Self Care Goal Status ([id number]): 0 percent impaired, limited or restricted Self Care Discharge Status [phone]): At least 1 percent but less than 20 percent impaired, limited or restricted  Shirlean Mylar, OTR/L  03/19/2012, 2:57 PM

## 2013-09-10 ENCOUNTER — Other Ambulatory Visit (HOSPITAL_COMMUNITY): Payer: Self-pay | Admitting: Family Medicine

## 2013-09-10 DIAGNOSIS — Z0000 Encounter for general adult medical examination without abnormal findings: Secondary | ICD-10-CM

## 2013-09-17 ENCOUNTER — Ambulatory Visit (HOSPITAL_COMMUNITY)
Admission: RE | Admit: 2013-09-17 | Discharge: 2013-09-17 | Disposition: A | Discharge status: Home / Self Care | Payer: Medicare (Managed Care) | Source: Ambulatory Visit | Attending: Family Medicine | Admitting: Family Medicine

## 2013-09-17 DIAGNOSIS — Z0000 Encounter for general adult medical examination without abnormal findings: Secondary | ICD-10-CM | POA: Diagnosis not present

## 2014-04-15 ENCOUNTER — Other Ambulatory Visit (HOSPITAL_COMMUNITY): Payer: Self-pay | Admitting: Family Medicine

## 2014-04-15 DIAGNOSIS — M5136 Other intervertebral disc degeneration, lumbar region: Secondary | ICD-10-CM

## 2014-04-15 DIAGNOSIS — M545 Low back pain: Secondary | ICD-10-CM

## 2014-04-15 DIAGNOSIS — Z1231 Encounter for screening mammogram for malignant neoplasm of breast: Secondary | ICD-10-CM | POA: Diagnosis not present

## 2014-04-15 DIAGNOSIS — M479 Spondylosis, unspecified: Secondary | ICD-10-CM | POA: Insufficient documentation

## 2014-04-15 DIAGNOSIS — Z139 Encounter for screening, unspecified: Secondary | ICD-10-CM

## 2014-04-16 ENCOUNTER — Ambulatory Visit (HOSPITAL_COMMUNITY)
Admission: RE | Admit: 2014-04-16 | Discharge: 2014-04-16 | Disposition: A | Discharge status: Home / Self Care | Payer: Medicare HMO | Source: Ambulatory Visit | Attending: Family Medicine | Admitting: Family Medicine

## 2014-04-16 DIAGNOSIS — M545 Low back pain: Secondary | ICD-10-CM

## 2014-04-16 DIAGNOSIS — M479 Spondylosis, unspecified: Secondary | ICD-10-CM | POA: Insufficient documentation

## 2014-04-16 DIAGNOSIS — Z1231 Encounter for screening mammogram for malignant neoplasm of breast: Secondary | ICD-10-CM | POA: Insufficient documentation

## 2014-04-16 DIAGNOSIS — M5136 Other intervertebral disc degeneration, lumbar region: Secondary | ICD-10-CM | POA: Insufficient documentation

## 2014-04-16 DIAGNOSIS — Z139 Encounter for screening, unspecified: Secondary | ICD-10-CM

## 2015-03-04 ENCOUNTER — Encounter (INDEPENDENT_AMBULATORY_CARE_PROVIDER_SITE_OTHER): Payer: Self-pay | Admitting: *Deleted

## 2015-03-24 DIAGNOSIS — R69 Illness, unspecified: Secondary | ICD-10-CM | POA: Diagnosis not present

## 2015-03-26 DIAGNOSIS — M79672 Pain in left foot: Secondary | ICD-10-CM | POA: Diagnosis not present

## 2015-03-26 DIAGNOSIS — B351 Tinea unguium: Secondary | ICD-10-CM | POA: Diagnosis not present

## 2015-03-26 DIAGNOSIS — M79671 Pain in right foot: Secondary | ICD-10-CM | POA: Diagnosis not present

## 2015-04-15 DIAGNOSIS — Z1389 Encounter for screening for other disorder: Secondary | ICD-10-CM | POA: Diagnosis not present

## 2015-04-15 DIAGNOSIS — J069 Acute upper respiratory infection, unspecified: Secondary | ICD-10-CM | POA: Diagnosis not present

## 2015-04-15 DIAGNOSIS — Z681 Body mass index (BMI) 19 or less, adult: Secondary | ICD-10-CM | POA: Diagnosis not present

## 2015-04-15 DIAGNOSIS — J209 Acute bronchitis, unspecified: Secondary | ICD-10-CM | POA: Diagnosis not present

## 2015-06-15 DIAGNOSIS — L570 Actinic keratosis: Secondary | ICD-10-CM | POA: Diagnosis not present

## 2015-06-15 DIAGNOSIS — Z85828 Personal history of other malignant neoplasm of skin: Secondary | ICD-10-CM | POA: Diagnosis not present

## 2015-07-17 DIAGNOSIS — J019 Acute sinusitis, unspecified: Secondary | ICD-10-CM | POA: Diagnosis not present

## 2015-07-17 DIAGNOSIS — J301 Allergic rhinitis due to pollen: Secondary | ICD-10-CM | POA: Diagnosis not present

## 2015-07-17 DIAGNOSIS — H6991 Unspecified Eustachian tube disorder, right ear: Secondary | ICD-10-CM | POA: Diagnosis not present

## 2015-07-17 DIAGNOSIS — Z681 Body mass index (BMI) 19 or less, adult: Secondary | ICD-10-CM | POA: Diagnosis not present

## 2015-08-13 IMAGING — CR DG LUMBAR SPINE 2-3V
3 series · 3 of 3 positions shown · non-contrast
Comparison: No priors.

CLINICAL DATA: 34-year-old female with intermittent low back pain
for the past several years, worsening over the past 3 months.

EXAM:
LUMBAR SPINE - 2-3 VIEW

[view not recorded (1 of 3)]
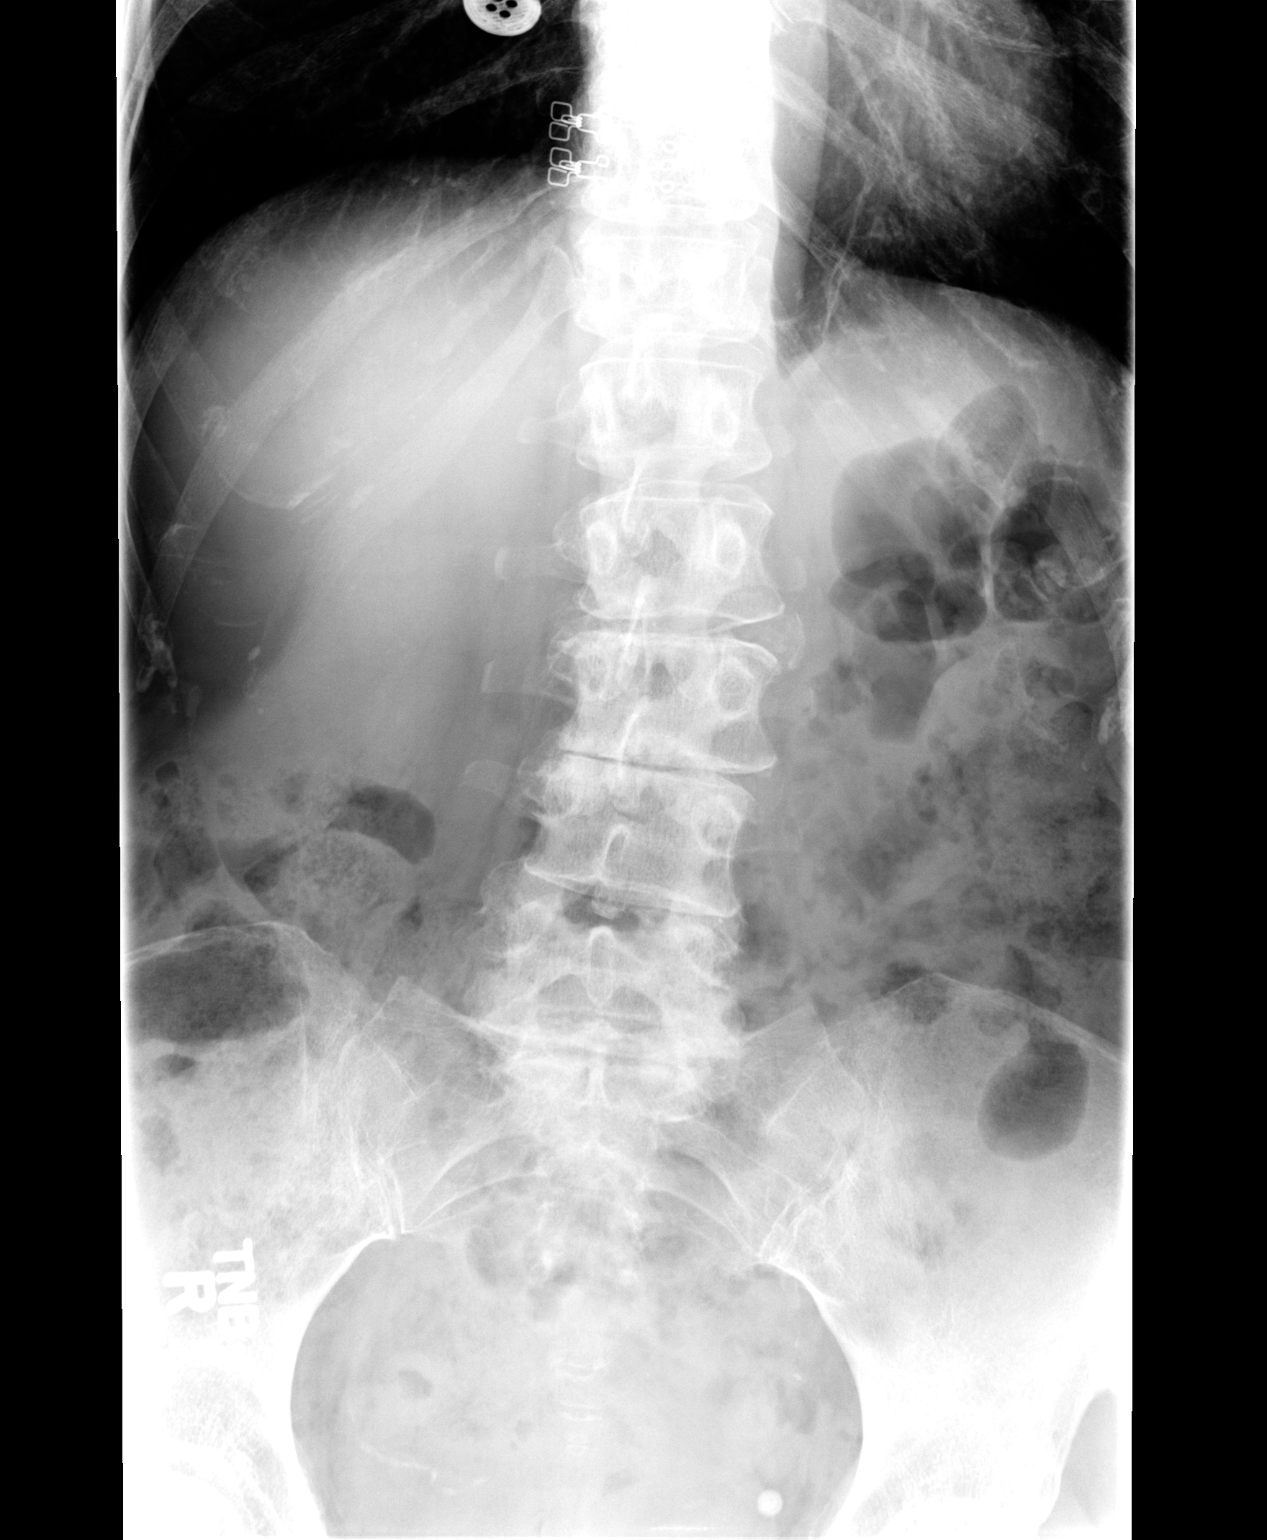

[view not recorded (2 of 3)]
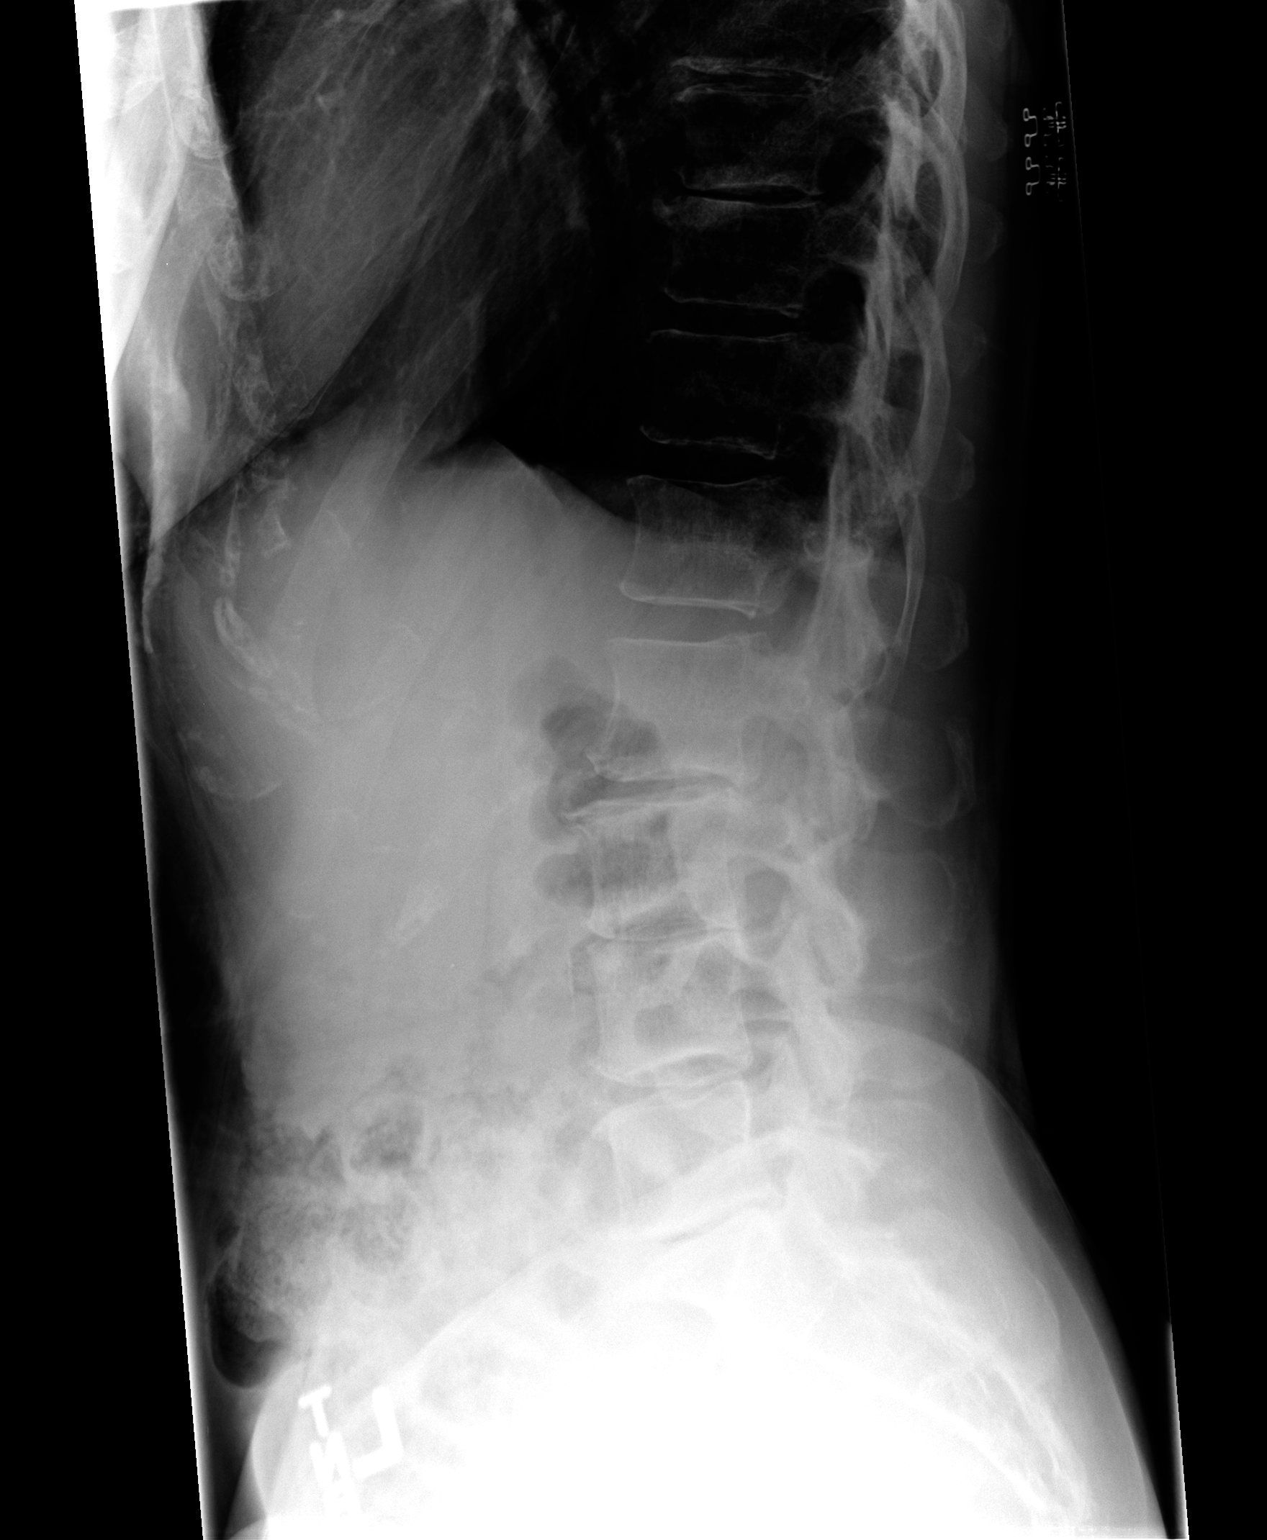

[view not recorded (3 of 3)]
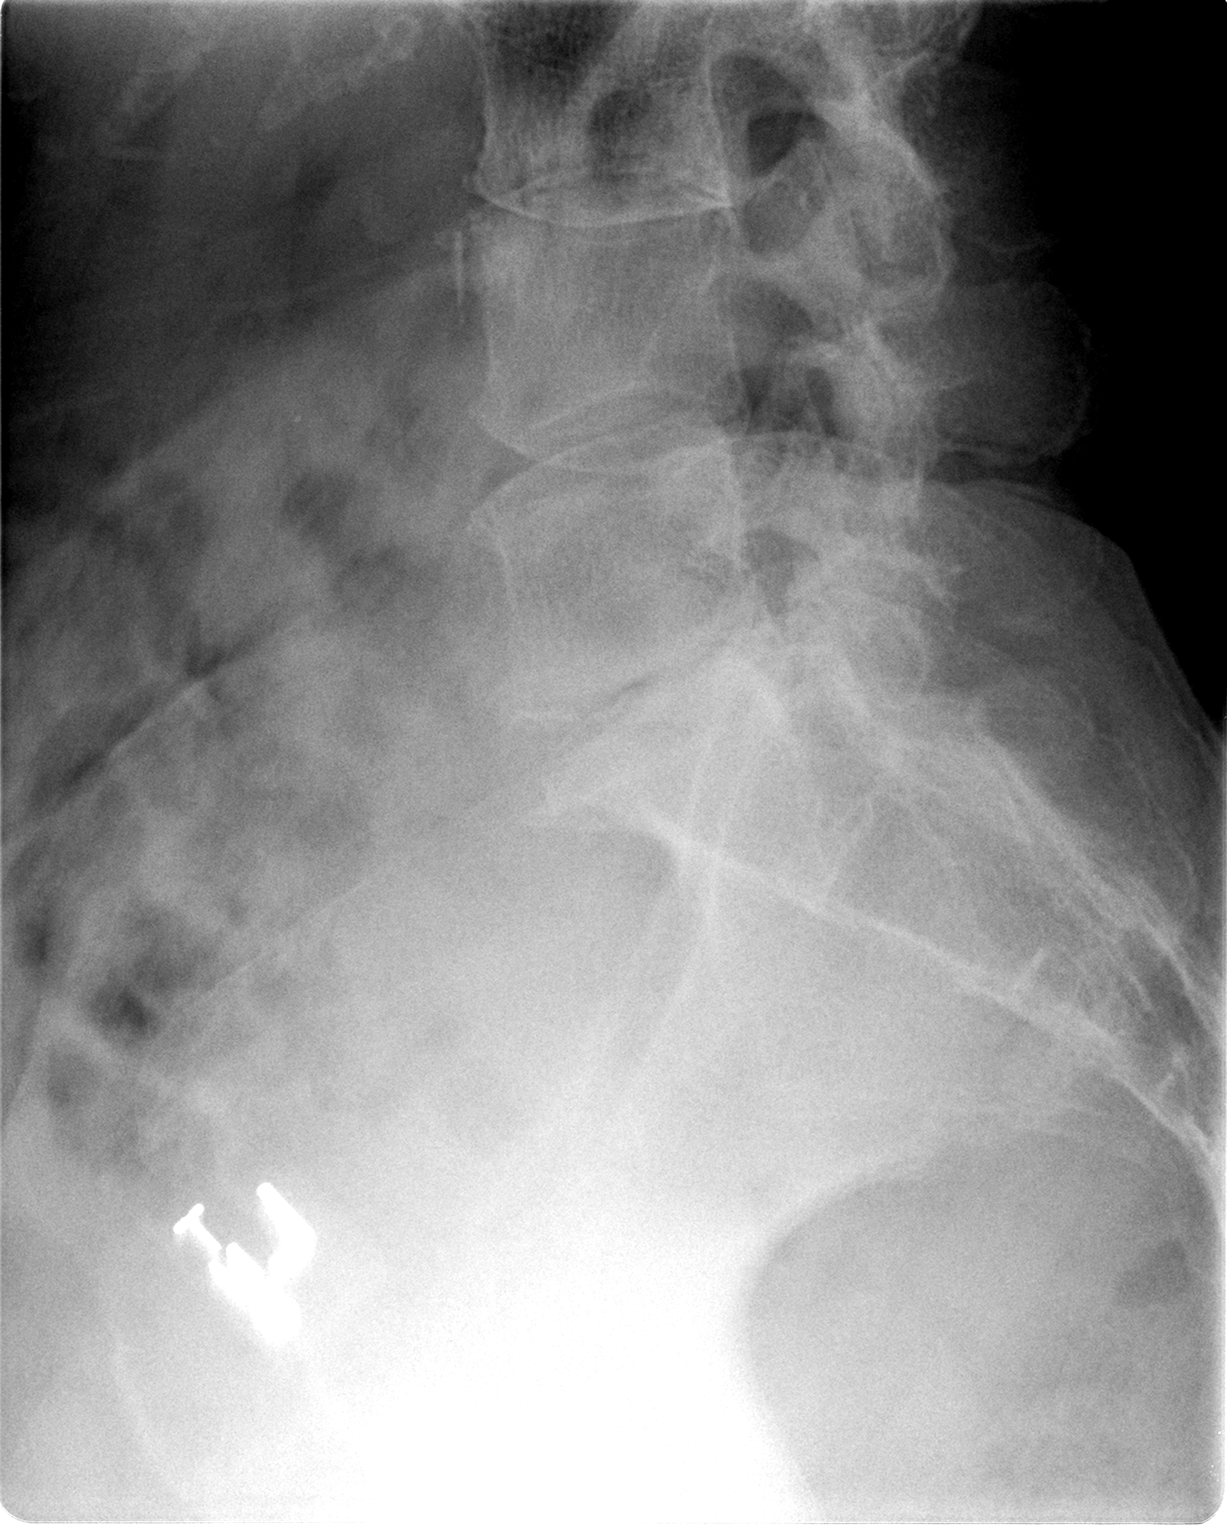

[3 of 3 positions shown; findings below may reference images not displayed]

FINDINGS: Three views of the lumbar spine demonstrate no acute displaced
fractures or compression type fractures. Mild straightening of
normal cervical lordosis, presumably chronic related to degenerative
disease. Alignment is otherwise anatomic. Multilevel degenerative
disc disease, most severe at L2-L3 and L3-L4. Multilevel facet
arthropathy, most severe at L5-S1.
IMPRESSION: 1. No acute radiographic abnormality of the lumbar spine.
2. Multilevel degenerative disc disease and lumbar spondylosis, as
above.

## 2015-10-22 DIAGNOSIS — H524 Presbyopia: Secondary | ICD-10-CM | POA: Diagnosis not present

## 2015-10-22 DIAGNOSIS — H5203 Hypermetropia, bilateral: Secondary | ICD-10-CM | POA: Diagnosis not present

## 2015-10-22 DIAGNOSIS — H3530 Unspecified macular degeneration: Secondary | ICD-10-CM | POA: Diagnosis not present

## 2015-10-22 DIAGNOSIS — H52223 Regular astigmatism, bilateral: Secondary | ICD-10-CM | POA: Diagnosis not present

## 2015-11-03 DIAGNOSIS — Z0000 Encounter for general adult medical examination without abnormal findings: Secondary | ICD-10-CM | POA: Diagnosis not present

## 2015-11-03 DIAGNOSIS — Z6820 Body mass index (BMI) 20.0-20.9, adult: Secondary | ICD-10-CM | POA: Diagnosis not present

## 2015-11-03 DIAGNOSIS — E039 Hypothyroidism, unspecified: Secondary | ICD-10-CM | POA: Diagnosis not present

## 2015-11-03 DIAGNOSIS — Z1389 Encounter for screening for other disorder: Secondary | ICD-10-CM | POA: Diagnosis not present

## 2015-11-03 DIAGNOSIS — M1991 Primary osteoarthritis, unspecified site: Secondary | ICD-10-CM | POA: Diagnosis not present

## 2015-11-06 DIAGNOSIS — Z0000 Encounter for general adult medical examination without abnormal findings: Secondary | ICD-10-CM | POA: Diagnosis not present

## 2015-11-06 DIAGNOSIS — Z1389 Encounter for screening for other disorder: Secondary | ICD-10-CM | POA: Diagnosis not present

## 2015-11-06 DIAGNOSIS — M1991 Primary osteoarthritis, unspecified site: Secondary | ICD-10-CM | POA: Diagnosis not present

## 2015-11-06 DIAGNOSIS — E039 Hypothyroidism, unspecified: Secondary | ICD-10-CM | POA: Diagnosis not present

## 2015-11-12 DIAGNOSIS — Z1211 Encounter for screening for malignant neoplasm of colon: Secondary | ICD-10-CM | POA: Diagnosis not present

## 2016-02-15 DIAGNOSIS — Z1389 Encounter for screening for other disorder: Secondary | ICD-10-CM | POA: Diagnosis not present

## 2016-02-15 DIAGNOSIS — Z6820 Body mass index (BMI) 20.0-20.9, adult: Secondary | ICD-10-CM | POA: Diagnosis not present

## 2016-02-15 DIAGNOSIS — M5410 Radiculopathy, site unspecified: Secondary | ICD-10-CM | POA: Diagnosis not present

## 2016-02-15 DIAGNOSIS — M5136 Other intervertebral disc degeneration, lumbar region: Secondary | ICD-10-CM | POA: Diagnosis not present

## 2016-02-19 ENCOUNTER — Other Ambulatory Visit (HOSPITAL_COMMUNITY): Payer: Self-pay | Admitting: Family Medicine

## 2016-02-22 ENCOUNTER — Other Ambulatory Visit (HOSPITAL_COMMUNITY): Payer: Self-pay | Admitting: Family Medicine

## 2016-02-22 DIAGNOSIS — M5136 Other intervertebral disc degeneration, lumbar region: Secondary | ICD-10-CM

## 2016-02-22 DIAGNOSIS — M5410 Radiculopathy, site unspecified: Secondary | ICD-10-CM

## 2016-02-26 DIAGNOSIS — M25511 Pain in right shoulder: Secondary | ICD-10-CM | POA: Diagnosis not present

## 2016-03-04 ENCOUNTER — Ambulatory Visit (HOSPITAL_COMMUNITY)
Admission: RE | Admit: 2016-03-04 | Discharge: 2016-03-04 | Disposition: A | Discharge status: Home / Self Care | Payer: Medicare HMO | Source: Ambulatory Visit | Attending: Family Medicine | Admitting: Family Medicine

## 2016-03-04 DIAGNOSIS — M5136 Other intervertebral disc degeneration, lumbar region: Secondary | ICD-10-CM | POA: Diagnosis not present

## 2016-03-04 DIAGNOSIS — M5116 Intervertebral disc disorders with radiculopathy, lumbar region: Secondary | ICD-10-CM | POA: Insufficient documentation

## 2016-03-04 DIAGNOSIS — M5410 Radiculopathy, site unspecified: Secondary | ICD-10-CM

## 2016-03-04 DIAGNOSIS — M5137 Other intervertebral disc degeneration, lumbosacral region: Secondary | ICD-10-CM | POA: Insufficient documentation

## 2016-03-04 DIAGNOSIS — M48061 Spinal stenosis, lumbar region without neurogenic claudication: Secondary | ICD-10-CM | POA: Insufficient documentation

## 2016-04-07 DIAGNOSIS — R030 Elevated blood-pressure reading, without diagnosis of hypertension: Secondary | ICD-10-CM | POA: Diagnosis not present

## 2016-04-07 DIAGNOSIS — G8929 Other chronic pain: Secondary | ICD-10-CM | POA: Diagnosis not present

## 2016-04-07 DIAGNOSIS — M545 Low back pain: Secondary | ICD-10-CM | POA: Diagnosis not present

## 2016-06-13 DIAGNOSIS — L570 Actinic keratosis: Secondary | ICD-10-CM | POA: Diagnosis not present

## 2016-06-13 DIAGNOSIS — Z85828 Personal history of other malignant neoplasm of skin: Secondary | ICD-10-CM | POA: Diagnosis not present

## 2016-06-13 DIAGNOSIS — L710 Perioral dermatitis: Secondary | ICD-10-CM | POA: Diagnosis not present

## 2016-06-14 DIAGNOSIS — Z0000 Encounter for general adult medical examination without abnormal findings: Secondary | ICD-10-CM | POA: Diagnosis not present

## 2016-06-14 DIAGNOSIS — Z791 Long term (current) use of non-steroidal anti-inflammatories (NSAID): Secondary | ICD-10-CM | POA: Diagnosis not present

## 2016-06-14 DIAGNOSIS — Z6820 Body mass index (BMI) 20.0-20.9, adult: Secondary | ICD-10-CM | POA: Diagnosis not present

## 2016-06-14 DIAGNOSIS — M13112 Monoarthritis, not elsewhere classified, left shoulder: Secondary | ICD-10-CM | POA: Diagnosis not present

## 2016-06-14 DIAGNOSIS — Z7982 Long term (current) use of aspirin: Secondary | ICD-10-CM | POA: Diagnosis not present

## 2016-06-14 DIAGNOSIS — M47819 Spondylosis without myelopathy or radiculopathy, site unspecified: Secondary | ICD-10-CM | POA: Diagnosis not present

## 2016-06-14 DIAGNOSIS — M13111 Monoarthritis, not elsewhere classified, right shoulder: Secondary | ICD-10-CM | POA: Diagnosis not present

## 2016-06-14 DIAGNOSIS — E039 Hypothyroidism, unspecified: Secondary | ICD-10-CM | POA: Diagnosis not present

## 2016-06-14 DIAGNOSIS — Z79899 Other long term (current) drug therapy: Secondary | ICD-10-CM | POA: Diagnosis not present

## 2016-07-20 DIAGNOSIS — R69 Illness, unspecified: Secondary | ICD-10-CM | POA: Diagnosis not present

## 2016-10-24 DIAGNOSIS — H35363 Drusen (degenerative) of macula, bilateral: Secondary | ICD-10-CM | POA: Diagnosis not present

## 2016-10-24 DIAGNOSIS — H5200 Hypermetropia, unspecified eye: Secondary | ICD-10-CM | POA: Diagnosis not present

## 2016-10-26 DIAGNOSIS — Z0100 Encounter for examination of eyes and vision without abnormal findings: Secondary | ICD-10-CM | POA: Diagnosis not present

## 2016-11-30 DIAGNOSIS — M25511 Pain in right shoulder: Secondary | ICD-10-CM | POA: Diagnosis not present

## 2016-12-14 DIAGNOSIS — Z23 Encounter for immunization: Secondary | ICD-10-CM | POA: Diagnosis not present

## 2016-12-14 DIAGNOSIS — E039 Hypothyroidism, unspecified: Secondary | ICD-10-CM | POA: Diagnosis not present

## 2016-12-14 DIAGNOSIS — M1991 Primary osteoarthritis, unspecified site: Secondary | ICD-10-CM | POA: Diagnosis not present

## 2016-12-14 DIAGNOSIS — Z0000 Encounter for general adult medical examination without abnormal findings: Secondary | ICD-10-CM | POA: Diagnosis not present

## 2016-12-14 DIAGNOSIS — E782 Mixed hyperlipidemia: Secondary | ICD-10-CM | POA: Diagnosis not present

## 2016-12-14 DIAGNOSIS — M1990 Unspecified osteoarthritis, unspecified site: Secondary | ICD-10-CM | POA: Diagnosis not present

## 2016-12-14 DIAGNOSIS — Z6820 Body mass index (BMI) 20.0-20.9, adult: Secondary | ICD-10-CM | POA: Diagnosis not present

## 2016-12-14 DIAGNOSIS — Z1389 Encounter for screening for other disorder: Secondary | ICD-10-CM | POA: Diagnosis not present

## 2016-12-15 LAB — LIPID PANEL
Cholesterol: 254 — AB (ref 0–200)
HDL: 104 — AB (ref 35–70)
LDL Cholesterol: 124
Triglycerides: 128 (ref 40–160)

## 2016-12-15 LAB — COMPREHENSIVE METABOLIC PANEL
Albumin: 4.6 (ref 3.5–5.0)
Calcium: 9.8 (ref 8.7–10.7)
GFR calc Af Amer: 95
GFR calc non Af Amer: 82
Globulin: 2.4

## 2016-12-15 LAB — CBC: RBC: 4.58 (ref 3.87–5.11)

## 2016-12-15 LAB — TSH: TSH: 0.96 (ref 0.41–5.90)

## 2016-12-15 LAB — BASIC METABOLIC PANEL
BUN: 12 (ref 4–21)
CO2: 25 — AB (ref 13–22)
Chloride: 103 (ref 99–108)
Creatinine: 0.7 (ref 0.5–1.1)
Glucose: 84
Potassium: 5.4 — AB (ref 3.4–5.3)
Sodium: 140 (ref 137–147)

## 2016-12-15 LAB — CBC AND DIFFERENTIAL
HCT: 40 (ref 36–46)
Hemoglobin: 13.5 (ref 12.0–16.0)
Platelets: 221 (ref 150–399)
WBC: 9.1

## 2016-12-15 LAB — HEPATIC FUNCTION PANEL
ALT: 14 (ref 7–35)
AST: 19 (ref 13–35)
Alkaline Phosphatase: 82 (ref 25–125)
Bilirubin, Total: 0.3

## 2016-12-15 LAB — VITAMIN D 25 HYDROXY (VIT D DEFICIENCY, FRACTURES): Vit D, 25-Hydroxy: 54.4

## 2016-12-19 ENCOUNTER — Other Ambulatory Visit (HOSPITAL_COMMUNITY): Payer: Self-pay | Admitting: Family Medicine

## 2016-12-19 DIAGNOSIS — Z1231 Encounter for screening mammogram for malignant neoplasm of breast: Secondary | ICD-10-CM

## 2016-12-26 ENCOUNTER — Encounter (HOSPITAL_COMMUNITY): Payer: Self-pay

## 2016-12-26 ENCOUNTER — Ambulatory Visit (HOSPITAL_COMMUNITY)
Admission: RE | Admit: 2016-12-26 | Discharge: 2016-12-26 | Disposition: A | Discharge status: Home / Self Care | Payer: Medicare HMO | Source: Ambulatory Visit | Attending: Family Medicine | Admitting: Family Medicine

## 2016-12-26 DIAGNOSIS — Z1231 Encounter for screening mammogram for malignant neoplasm of breast: Secondary | ICD-10-CM | POA: Diagnosis not present

## 2017-01-10 DIAGNOSIS — Z6821 Body mass index (BMI) 21.0-21.9, adult: Secondary | ICD-10-CM | POA: Diagnosis not present

## 2017-01-10 DIAGNOSIS — J301 Allergic rhinitis due to pollen: Secondary | ICD-10-CM | POA: Diagnosis not present

## 2017-01-10 DIAGNOSIS — J019 Acute sinusitis, unspecified: Secondary | ICD-10-CM | POA: Diagnosis not present

## 2017-01-14 DIAGNOSIS — Z1211 Encounter for screening for malignant neoplasm of colon: Secondary | ICD-10-CM | POA: Diagnosis not present

## 2017-02-12 LAB — FECAL OCCULT BLOOD, GUAIAC: Fecal Occult Blood: NEGATIVE

## 2017-02-27 DIAGNOSIS — Z1211 Encounter for screening for malignant neoplasm of colon: Secondary | ICD-10-CM | POA: Diagnosis not present

## 2017-05-02 DIAGNOSIS — H6593 Unspecified nonsuppurative otitis media, bilateral: Secondary | ICD-10-CM | POA: Diagnosis not present

## 2017-05-02 DIAGNOSIS — J302 Other seasonal allergic rhinitis: Secondary | ICD-10-CM | POA: Diagnosis not present

## 2017-05-02 DIAGNOSIS — H6122 Impacted cerumen, left ear: Secondary | ICD-10-CM | POA: Diagnosis not present

## 2017-05-02 DIAGNOSIS — Z1389 Encounter for screening for other disorder: Secondary | ICD-10-CM | POA: Diagnosis not present

## 2017-05-02 DIAGNOSIS — Z6820 Body mass index (BMI) 20.0-20.9, adult: Secondary | ICD-10-CM | POA: Diagnosis not present

## 2017-06-06 DIAGNOSIS — R69 Illness, unspecified: Secondary | ICD-10-CM | POA: Diagnosis not present

## 2017-06-12 DIAGNOSIS — L719 Rosacea, unspecified: Secondary | ICD-10-CM | POA: Diagnosis not present

## 2017-06-12 DIAGNOSIS — Z85828 Personal history of other malignant neoplasm of skin: Secondary | ICD-10-CM | POA: Diagnosis not present

## 2017-07-06 ENCOUNTER — Ambulatory Visit (INDEPENDENT_AMBULATORY_CARE_PROVIDER_SITE_OTHER): Payer: Medicare HMO | Admitting: Otolaryngology

## 2017-07-06 DIAGNOSIS — H6983 Other specified disorders of Eustachian tube, bilateral: Secondary | ICD-10-CM

## 2017-07-06 DIAGNOSIS — H903 Sensorineural hearing loss, bilateral: Secondary | ICD-10-CM | POA: Diagnosis not present

## 2017-07-06 DIAGNOSIS — H9201 Otalgia, right ear: Secondary | ICD-10-CM | POA: Diagnosis not present

## 2017-09-07 ENCOUNTER — Ambulatory Visit (INDEPENDENT_AMBULATORY_CARE_PROVIDER_SITE_OTHER): Payer: Medicare HMO | Admitting: Otolaryngology

## 2017-09-07 DIAGNOSIS — H9209 Otalgia, unspecified ear: Secondary | ICD-10-CM

## 2017-09-07 DIAGNOSIS — H6123 Impacted cerumen, bilateral: Secondary | ICD-10-CM | POA: Diagnosis not present

## 2017-12-06 DIAGNOSIS — R69 Illness, unspecified: Secondary | ICD-10-CM | POA: Diagnosis not present

## 2018-01-05 DIAGNOSIS — H53022 Refractive amblyopia, left eye: Secondary | ICD-10-CM | POA: Diagnosis not present

## 2018-01-05 DIAGNOSIS — H2513 Age-related nuclear cataract, bilateral: Secondary | ICD-10-CM | POA: Diagnosis not present

## 2018-01-05 DIAGNOSIS — H35363 Drusen (degenerative) of macula, bilateral: Secondary | ICD-10-CM | POA: Diagnosis not present

## 2018-01-05 DIAGNOSIS — H5203 Hypermetropia, bilateral: Secondary | ICD-10-CM | POA: Diagnosis not present

## 2018-01-12 DIAGNOSIS — E559 Vitamin D deficiency, unspecified: Secondary | ICD-10-CM | POA: Diagnosis not present

## 2018-01-12 DIAGNOSIS — M1991 Primary osteoarthritis, unspecified site: Secondary | ICD-10-CM | POA: Diagnosis not present

## 2018-01-12 DIAGNOSIS — Z1389 Encounter for screening for other disorder: Secondary | ICD-10-CM | POA: Diagnosis not present

## 2018-01-12 DIAGNOSIS — Z6820 Body mass index (BMI) 20.0-20.9, adult: Secondary | ICD-10-CM | POA: Diagnosis not present

## 2018-01-12 DIAGNOSIS — J302 Other seasonal allergic rhinitis: Secondary | ICD-10-CM | POA: Diagnosis not present

## 2018-01-12 DIAGNOSIS — E039 Hypothyroidism, unspecified: Secondary | ICD-10-CM | POA: Diagnosis not present

## 2018-01-12 DIAGNOSIS — E785 Hyperlipidemia, unspecified: Secondary | ICD-10-CM | POA: Diagnosis not present

## 2018-01-13 LAB — CBC AND DIFFERENTIAL
HCT: 37 (ref 36–46)
Hemoglobin: 12.7 (ref 12.0–16.0)
Platelets: 256 (ref 150–399)
WBC: 8.9

## 2018-01-13 LAB — HEPATIC FUNCTION PANEL
ALT: 13 (ref 7–35)
AST: 22 (ref 13–35)
Alkaline Phosphatase: 82 (ref 25–125)
Bilirubin, Total: 0.4

## 2018-01-13 LAB — BASIC METABOLIC PANEL
BUN: 13 (ref 4–21)
CO2: 26 — AB (ref 13–22)
Chloride: 103 (ref 99–108)
Creatinine: 0.8 (ref 0.5–1.1)
Glucose: 84
Potassium: 4.9 (ref 3.4–5.3)
Sodium: 141 (ref 137–147)

## 2018-01-13 LAB — LIPID PANEL
Cholesterol: 248 — AB (ref 0–200)
HDL: 95 — AB (ref 35–70)
LDL Cholesterol: 130
Triglycerides: 117 (ref 40–160)

## 2018-01-13 LAB — VITAMIN D 25 HYDROXY (VIT D DEFICIENCY, FRACTURES): Vit D, 25-Hydroxy: 70.6

## 2018-01-13 LAB — COMPREHENSIVE METABOLIC PANEL
Albumin: 4.4 (ref 3.5–5.0)
Calcium: 9.6 (ref 8.7–10.7)
GFR calc Af Amer: 80
GFR calc non Af Amer: 70
Globulin: 2.2

## 2018-01-13 LAB — TSH: TSH: 0.31 — AB (ref 0.41–5.90)

## 2018-01-13 LAB — CBC: RBC: 4.31 (ref 3.87–5.11)

## 2018-02-16 DIAGNOSIS — Z23 Encounter for immunization: Secondary | ICD-10-CM | POA: Diagnosis not present

## 2018-02-27 DIAGNOSIS — H353123 Nonexudative age-related macular degeneration, left eye, advanced atrophic without subfoveal involvement: Secondary | ICD-10-CM | POA: Diagnosis not present

## 2018-02-27 DIAGNOSIS — H35422 Microcystoid degeneration of retina, left eye: Secondary | ICD-10-CM | POA: Diagnosis not present

## 2018-02-27 DIAGNOSIS — H43813 Vitreous degeneration, bilateral: Secondary | ICD-10-CM | POA: Diagnosis not present

## 2018-02-27 DIAGNOSIS — H353112 Nonexudative age-related macular degeneration, right eye, intermediate dry stage: Secondary | ICD-10-CM | POA: Diagnosis not present

## 2018-06-13 DIAGNOSIS — L659 Nonscarring hair loss, unspecified: Secondary | ICD-10-CM | POA: Diagnosis not present

## 2018-06-13 DIAGNOSIS — L570 Actinic keratosis: Secondary | ICD-10-CM | POA: Diagnosis not present

## 2018-06-13 DIAGNOSIS — Z85828 Personal history of other malignant neoplasm of skin: Secondary | ICD-10-CM | POA: Diagnosis not present

## 2018-07-02 DIAGNOSIS — R69 Illness, unspecified: Secondary | ICD-10-CM | POA: Diagnosis not present

## 2018-08-30 ENCOUNTER — Other Ambulatory Visit: Payer: Self-pay

## 2018-08-30 ENCOUNTER — Other Ambulatory Visit (HOSPITAL_COMMUNITY): Payer: Self-pay | Admitting: Family Medicine

## 2018-08-30 ENCOUNTER — Ambulatory Visit (HOSPITAL_COMMUNITY)
Admission: RE | Admit: 2018-08-30 | Discharge: 2018-08-30 | Disposition: A | Discharge status: Home / Self Care | Payer: Medicare HMO | Source: Ambulatory Visit | Attending: Family Medicine | Admitting: Family Medicine

## 2018-08-30 DIAGNOSIS — Z0000 Encounter for general adult medical examination without abnormal findings: Secondary | ICD-10-CM | POA: Diagnosis not present

## 2018-08-30 DIAGNOSIS — K219 Gastro-esophageal reflux disease without esophagitis: Secondary | ICD-10-CM | POA: Diagnosis not present

## 2018-08-30 DIAGNOSIS — M542 Cervicalgia: Secondary | ICD-10-CM

## 2018-08-30 DIAGNOSIS — R413 Other amnesia: Secondary | ICD-10-CM | POA: Diagnosis not present

## 2018-08-30 DIAGNOSIS — E785 Hyperlipidemia, unspecified: Secondary | ICD-10-CM | POA: Diagnosis not present

## 2018-08-30 DIAGNOSIS — Z6820 Body mass index (BMI) 20.0-20.9, adult: Secondary | ICD-10-CM | POA: Diagnosis not present

## 2018-08-30 DIAGNOSIS — Z1389 Encounter for screening for other disorder: Secondary | ICD-10-CM | POA: Diagnosis not present

## 2018-08-30 LAB — BASIC METABOLIC PANEL
BUN: 15 (ref 4–21)
CO2: 25 — AB (ref 13–22)
Chloride: 100 (ref 99–108)
Creatinine: 0.8 (ref 0.5–1.1)
Glucose: 85
Potassium: 4.7 (ref 3.4–5.3)
Sodium: 139 (ref 137–147)

## 2018-08-30 LAB — COMPREHENSIVE METABOLIC PANEL
Albumin: 4.6 (ref 3.5–5.0)
Calcium: 9.9 (ref 8.7–10.7)
GFR calc Af Amer: 86
GFR calc non Af Amer: 75
Globulin: 2.7

## 2018-08-30 LAB — LIPID PANEL
Cholesterol: 233 — AB (ref 0–200)
Cholesterol: 233 — AB (ref 0–200)
HDL: 95 — AB (ref 35–70)
HDL: 95 — AB (ref 35–70)
LDL Cholesterol: 119
LDL Cholesterol: 19
Triglycerides: 93 (ref 40–160)
Triglycerides: 93 (ref 40–160)

## 2018-08-30 LAB — VITAMIN D 25 HYDROXY (VIT D DEFICIENCY, FRACTURES): Vit D, 25-Hydroxy: 74.8

## 2018-08-30 LAB — CBC AND DIFFERENTIAL
HCT: 41 (ref 36–46)
Hemoglobin: 13.8 (ref 12.0–16.0)
Platelets: 267 (ref 150–399)
WBC: 8.8

## 2018-08-30 LAB — HEPATIC FUNCTION PANEL
ALT: 16 (ref 7–35)
AST: 25 (ref 13–35)
Alkaline Phosphatase: 86 (ref 25–125)
Bilirubin, Total: 0.2

## 2018-08-30 LAB — VITAMIN B12: Vitamin B-12: 2000

## 2018-08-30 LAB — CBC: RBC: 4.68 (ref 3.87–5.11)

## 2018-08-30 LAB — TSH: TSH: 0.31 — AB (ref 0.41–5.90)

## 2018-10-01 ENCOUNTER — Ambulatory Visit (INDEPENDENT_AMBULATORY_CARE_PROVIDER_SITE_OTHER): Payer: Medicare HMO | Admitting: Otolaryngology

## 2018-10-01 DIAGNOSIS — H9209 Otalgia, unspecified ear: Secondary | ICD-10-CM | POA: Diagnosis not present

## 2018-10-01 DIAGNOSIS — H6123 Impacted cerumen, bilateral: Secondary | ICD-10-CM

## 2018-10-10 DIAGNOSIS — M545 Low back pain: Secondary | ICD-10-CM | POA: Diagnosis not present

## 2018-10-10 DIAGNOSIS — M542 Cervicalgia: Secondary | ICD-10-CM | POA: Diagnosis not present

## 2018-10-10 DIAGNOSIS — M9901 Segmental and somatic dysfunction of cervical region: Secondary | ICD-10-CM | POA: Diagnosis not present

## 2018-10-10 DIAGNOSIS — M546 Pain in thoracic spine: Secondary | ICD-10-CM | POA: Diagnosis not present

## 2018-10-10 DIAGNOSIS — M9903 Segmental and somatic dysfunction of lumbar region: Secondary | ICD-10-CM | POA: Diagnosis not present

## 2018-10-10 DIAGNOSIS — M9905 Segmental and somatic dysfunction of pelvic region: Secondary | ICD-10-CM | POA: Diagnosis not present

## 2018-10-10 DIAGNOSIS — M9902 Segmental and somatic dysfunction of thoracic region: Secondary | ICD-10-CM | POA: Diagnosis not present

## 2018-10-12 DIAGNOSIS — M542 Cervicalgia: Secondary | ICD-10-CM | POA: Diagnosis not present

## 2018-10-12 DIAGNOSIS — M9903 Segmental and somatic dysfunction of lumbar region: Secondary | ICD-10-CM | POA: Diagnosis not present

## 2018-10-12 DIAGNOSIS — M546 Pain in thoracic spine: Secondary | ICD-10-CM | POA: Diagnosis not present

## 2018-10-12 DIAGNOSIS — M545 Low back pain: Secondary | ICD-10-CM | POA: Diagnosis not present

## 2018-10-12 DIAGNOSIS — M9902 Segmental and somatic dysfunction of thoracic region: Secondary | ICD-10-CM | POA: Diagnosis not present

## 2018-10-12 DIAGNOSIS — M9901 Segmental and somatic dysfunction of cervical region: Secondary | ICD-10-CM | POA: Diagnosis not present

## 2018-10-12 DIAGNOSIS — M9905 Segmental and somatic dysfunction of pelvic region: Secondary | ICD-10-CM | POA: Diagnosis not present

## 2018-10-15 DIAGNOSIS — M545 Low back pain: Secondary | ICD-10-CM | POA: Diagnosis not present

## 2018-10-15 DIAGNOSIS — M9905 Segmental and somatic dysfunction of pelvic region: Secondary | ICD-10-CM | POA: Diagnosis not present

## 2018-10-15 DIAGNOSIS — M546 Pain in thoracic spine: Secondary | ICD-10-CM | POA: Diagnosis not present

## 2018-10-15 DIAGNOSIS — M542 Cervicalgia: Secondary | ICD-10-CM | POA: Diagnosis not present

## 2018-10-15 DIAGNOSIS — M9902 Segmental and somatic dysfunction of thoracic region: Secondary | ICD-10-CM | POA: Diagnosis not present

## 2018-10-15 DIAGNOSIS — M9903 Segmental and somatic dysfunction of lumbar region: Secondary | ICD-10-CM | POA: Diagnosis not present

## 2018-10-15 DIAGNOSIS — M9901 Segmental and somatic dysfunction of cervical region: Secondary | ICD-10-CM | POA: Diagnosis not present

## 2018-10-17 DIAGNOSIS — M545 Low back pain: Secondary | ICD-10-CM | POA: Diagnosis not present

## 2018-10-17 DIAGNOSIS — M9902 Segmental and somatic dysfunction of thoracic region: Secondary | ICD-10-CM | POA: Diagnosis not present

## 2018-10-17 DIAGNOSIS — M9901 Segmental and somatic dysfunction of cervical region: Secondary | ICD-10-CM | POA: Diagnosis not present

## 2018-10-17 DIAGNOSIS — M9903 Segmental and somatic dysfunction of lumbar region: Secondary | ICD-10-CM | POA: Diagnosis not present

## 2018-10-17 DIAGNOSIS — M546 Pain in thoracic spine: Secondary | ICD-10-CM | POA: Diagnosis not present

## 2018-10-17 DIAGNOSIS — M542 Cervicalgia: Secondary | ICD-10-CM | POA: Diagnosis not present

## 2018-10-17 DIAGNOSIS — M9905 Segmental and somatic dysfunction of pelvic region: Secondary | ICD-10-CM | POA: Diagnosis not present

## 2018-10-24 DIAGNOSIS — M9901 Segmental and somatic dysfunction of cervical region: Secondary | ICD-10-CM | POA: Diagnosis not present

## 2018-10-24 DIAGNOSIS — M545 Low back pain: Secondary | ICD-10-CM | POA: Diagnosis not present

## 2018-10-24 DIAGNOSIS — M9903 Segmental and somatic dysfunction of lumbar region: Secondary | ICD-10-CM | POA: Diagnosis not present

## 2018-10-24 DIAGNOSIS — M9902 Segmental and somatic dysfunction of thoracic region: Secondary | ICD-10-CM | POA: Diagnosis not present

## 2018-10-24 DIAGNOSIS — M546 Pain in thoracic spine: Secondary | ICD-10-CM | POA: Diagnosis not present

## 2018-10-24 DIAGNOSIS — M9905 Segmental and somatic dysfunction of pelvic region: Secondary | ICD-10-CM | POA: Diagnosis not present

## 2018-10-24 DIAGNOSIS — M542 Cervicalgia: Secondary | ICD-10-CM | POA: Diagnosis not present

## 2018-11-23 DIAGNOSIS — R42 Dizziness and giddiness: Secondary | ICD-10-CM | POA: Diagnosis not present

## 2018-11-23 DIAGNOSIS — Z23 Encounter for immunization: Secondary | ICD-10-CM | POA: Diagnosis not present

## 2018-11-23 DIAGNOSIS — E039 Hypothyroidism, unspecified: Secondary | ICD-10-CM | POA: Diagnosis not present

## 2018-11-23 DIAGNOSIS — Z6820 Body mass index (BMI) 20.0-20.9, adult: Secondary | ICD-10-CM | POA: Diagnosis not present

## 2018-11-24 LAB — TSH: TSH: 3.99 (ref 0.41–5.90)

## 2018-12-03 DIAGNOSIS — M542 Cervicalgia: Secondary | ICD-10-CM | POA: Diagnosis not present

## 2018-12-03 DIAGNOSIS — M9902 Segmental and somatic dysfunction of thoracic region: Secondary | ICD-10-CM | POA: Diagnosis not present

## 2018-12-03 DIAGNOSIS — M9901 Segmental and somatic dysfunction of cervical region: Secondary | ICD-10-CM | POA: Diagnosis not present

## 2018-12-03 DIAGNOSIS — M546 Pain in thoracic spine: Secondary | ICD-10-CM | POA: Diagnosis not present

## 2018-12-07 DIAGNOSIS — M542 Cervicalgia: Secondary | ICD-10-CM | POA: Diagnosis not present

## 2018-12-07 DIAGNOSIS — M9902 Segmental and somatic dysfunction of thoracic region: Secondary | ICD-10-CM | POA: Diagnosis not present

## 2018-12-07 DIAGNOSIS — M9901 Segmental and somatic dysfunction of cervical region: Secondary | ICD-10-CM | POA: Diagnosis not present

## 2018-12-07 DIAGNOSIS — M546 Pain in thoracic spine: Secondary | ICD-10-CM | POA: Diagnosis not present

## 2018-12-12 ENCOUNTER — Other Ambulatory Visit: Payer: Self-pay

## 2018-12-12 DIAGNOSIS — Z20822 Contact with and (suspected) exposure to covid-19: Secondary | ICD-10-CM

## 2018-12-14 LAB — NOVEL CORONAVIRUS, NAA: SARS-CoV-2, NAA: NOT DETECTED

## 2019-01-01 DIAGNOSIS — E039 Hypothyroidism, unspecified: Secondary | ICD-10-CM | POA: Diagnosis not present

## 2019-01-02 LAB — TSH: TSH: 6.16 — AB (ref 0.41–5.90)

## 2019-01-03 DIAGNOSIS — R69 Illness, unspecified: Secondary | ICD-10-CM | POA: Diagnosis not present

## 2019-02-04 DIAGNOSIS — E039 Hypothyroidism, unspecified: Secondary | ICD-10-CM | POA: Diagnosis not present

## 2019-02-05 LAB — TSH: TSH: 3.19 (ref 0.41–5.90)

## 2019-03-03 DIAGNOSIS — M1991 Primary osteoarthritis, unspecified site: Secondary | ICD-10-CM | POA: Diagnosis not present

## 2019-03-03 DIAGNOSIS — E7849 Other hyperlipidemia: Secondary | ICD-10-CM | POA: Diagnosis not present

## 2019-03-03 DIAGNOSIS — E039 Hypothyroidism, unspecified: Secondary | ICD-10-CM | POA: Diagnosis not present

## 2019-03-31 DIAGNOSIS — E7849 Other hyperlipidemia: Secondary | ICD-10-CM | POA: Diagnosis not present

## 2019-03-31 DIAGNOSIS — E039 Hypothyroidism, unspecified: Secondary | ICD-10-CM | POA: Diagnosis not present

## 2019-03-31 DIAGNOSIS — M1991 Primary osteoarthritis, unspecified site: Secondary | ICD-10-CM | POA: Diagnosis not present

## 2019-04-04 DIAGNOSIS — E039 Hypothyroidism, unspecified: Secondary | ICD-10-CM | POA: Diagnosis not present

## 2019-04-04 DIAGNOSIS — Z1389 Encounter for screening for other disorder: Secondary | ICD-10-CM | POA: Diagnosis not present

## 2019-04-04 DIAGNOSIS — R002 Palpitations: Secondary | ICD-10-CM | POA: Diagnosis not present

## 2019-04-04 DIAGNOSIS — Z6820 Body mass index (BMI) 20.0-20.9, adult: Secondary | ICD-10-CM | POA: Diagnosis not present

## 2019-04-05 LAB — TSH: TSH: 4.4 (ref 0.41–5.90)

## 2019-04-08 DIAGNOSIS — H353131 Nonexudative age-related macular degeneration, bilateral, early dry stage: Secondary | ICD-10-CM | POA: Diagnosis not present

## 2019-05-01 DIAGNOSIS — M1991 Primary osteoarthritis, unspecified site: Secondary | ICD-10-CM | POA: Diagnosis not present

## 2019-05-01 DIAGNOSIS — E039 Hypothyroidism, unspecified: Secondary | ICD-10-CM | POA: Diagnosis not present

## 2019-05-01 DIAGNOSIS — E7849 Other hyperlipidemia: Secondary | ICD-10-CM | POA: Diagnosis not present

## 2019-05-08 ENCOUNTER — Other Ambulatory Visit: Payer: Self-pay

## 2019-05-08 ENCOUNTER — Ambulatory Visit: Payer: Medicare HMO

## 2019-05-08 ENCOUNTER — Encounter: Payer: Self-pay | Admitting: Cardiology

## 2019-05-08 ENCOUNTER — Ambulatory Visit: Payer: Medicare HMO | Admitting: Cardiology

## 2019-05-08 VITALS — BP 122/78 | HR 72 | Temp 98.6°F | Wt 125.4 lb

## 2019-05-08 DIAGNOSIS — E039 Hypothyroidism, unspecified: Secondary | ICD-10-CM | POA: Diagnosis not present

## 2019-05-08 DIAGNOSIS — R002 Palpitations: Secondary | ICD-10-CM

## 2019-05-08 DIAGNOSIS — I493 Ventricular premature depolarization: Secondary | ICD-10-CM

## 2019-05-08 DIAGNOSIS — Z1211 Encounter for screening for malignant neoplasm of colon: Secondary | ICD-10-CM | POA: Diagnosis not present

## 2019-05-08 LAB — FECAL OCCULT BLOOD, GUAIAC: Fecal Occult Blood: NEGATIVE

## 2019-05-08 NOTE — Patient Instructions (Signed)
Medication Instructions:  Your physician recommends that you continue on your current medications as directed. Please refer to the Current Medication list given to you today.   Labwork: NONE  Testing/Procedures: Your physician has requested that you have an echocardiogram. Echocardiography is a painless test that uses sound waves to create images of your heart. It provides your doctor with information about the size and shape of your heart and how well your heart's chambers and valves are working. This procedure takes approximately one hour. There are no restrictions for this procedure.  Your physician has recommended that you wear a holter monitor. Holter monitors are medical devices that record the heart's electrical activity. Doctors most often use these monitors to diagnose arrhythmias. Arrhythmias are problems with the speed or rhythm of the heartbeat. The monitor is a small, portable device. You can wear one while you do your normal daily activities. This is usually used to diagnose what is causing palpitations/syncope (passing out).    Follow-Up: Your physician recommends that you schedule a follow-up appointment in: WE WILL CALL WITH TEST RESULTS    Any Other Special Instructions Will Be Listed Below (If Applicable).     If you need a refill on your cardiac medications before your next appointment, please call your pharmacy.

## 2019-05-08 NOTE — Progress Notes (Signed)
Cardiology Office Note  Date: 05/08/2019   ID: Nancy Williams, DOB 11/02/1940, MRN [id number]  PCP:  Sharilyn Sites, MD  Cardiologist:  Rozann Lesches, MD Electrophysiologist:  None   Chief Complaint  Patient presents with  . Palpitations    History of Present Illness: Nancy Williams is an 79 y.o. female referred for cardiology consultation by Dr. Hilma Favors for the evaluation of palpitations.  She is here today with her daughter.  She states that over the last few months she has been experiencing a feeling of jitteriness in her chest, typically in the mornings when she gets up, feels anxious, not necessarily a fast heartbeat.  As the day goes on, the symptoms get better.  She has had no associated chest pain or syncope.  She thought that it might have been related to a change in her thyroid medication, but she has been taking her typical dose recently and her most recent TSH in March was normal range.  She has otherwise been healthy over her lifetime, no major chronic health conditions or previous surgeries.  She has no known history of cardiac arrhythmia.  I personally reviewed the recent tracing from PCP done in March which shows normal sinus rhythm with PVCs, decreased R wave progression, borderline low voltage.   Past Medical History:  Diagnosis Date  . Hypothyroidism   . Seasonal allergies     Past Surgical History:  Procedure Laterality Date  . No previous surgery      Current Outpatient Medications  Medication Sig Dispense Refill  . levothyroxine (SYNTHROID) 75 MCG tablet Take 75 mcg by mouth daily before breakfast.     No current facility-administered medications for this visit.   Allergies:  Patient has no known allergies.   Social History: The patient  reports that she has never smoked. She has never used smokeless tobacco. She reports current alcohol use. She reports that she does not use drugs.   Family History: The patient's family history includes Heart disease  in her father and mother.   ROS:   No sudden unexplained syncope.  Physical Exam: VS:  BP 122/78   Pulse 72   Temp 98.6 F (37 C)   Wt 125 lb 6.4 oz (56.9 kg)   SpO2 97% , BMI There is no height or weight on file to calculate BMI.  Wt Readings from Last 3 Encounters:  05/08/19 125 lb 6.4 oz (56.9 kg)    General: Elderly woman, appears younger than stated age, comfortable at rest. HEENT: Conjunctiva and lids normal, wearing a mask. Neck: Supple, no elevated JVP or carotid bruits, no thyromegaly. Lungs: Clear to auscultation, nonlabored breathing at rest. Cardiac: Regular rate and rhythm with ectopy consistent with PVCs, no S3, soft systolic murmur. Abdomen: Soft, bowel sounds present. Extremities: No pitting edema, distal pulses 2+. Skin: Warm and dry. Musculoskeletal: No kyphosis. Neuropsychiatric: Alert and oriented x3, affect grossly appropriate.  ECG:  There are no old tracings for comparison today.  Recent Labwork:  Recent lab work for review today.  Other Studies Reviewed Today:  No previous cardiac testing for review today.  Assessment and Plan:  1.  Palpitations as discussed above.  No associated chest pain or syncope.  Feels anxious/nervous with the symptoms.  Not entirely clear that this is related to her thyroid status based on recent normal TSH and stable dose of levothyroxine.  Plan to obtain a 72-hour Zio patch for exclusion of arrhythmia.  She does have PVCs by both ECG  and on examination today.  Not having symptoms with these at present however, so unclear if related to her morning symptoms either.  We will get an echocardiogram to ensure structurally normal heart as well.  2.  Hypothyroidism, on Synthroid.  Recent TSH normal in March per PCP.  Medication Adjustments/Labs and Tests Ordered: Current medicines are reviewed at length with the patient today.  Concerns regarding medicines are outlined above.   Tests Ordered: Orders Placed This Encounter    Procedures  . Holter monitor - 72 hour  . ECHOCARDIOGRAM COMPLETE    Medication Changes: No orders of the defined types were placed in this encounter.   Disposition:  Follow up test results.  Signed, [name], MD, [facility] 05/08/2019 1:36 PM    [facility] [address] [place], [place] [address] Phone: [phone]; Fax: [phone]

## 2019-05-14 ENCOUNTER — Ambulatory Visit (HOSPITAL_COMMUNITY)
Admission: RE | Admit: 2019-05-14 | Discharge: 2019-05-14 | Disposition: A | Discharge status: Home / Self Care | Payer: Medicare HMO | Source: Ambulatory Visit | Attending: Cardiology | Admitting: Cardiology

## 2019-05-14 DIAGNOSIS — I083 Combined rheumatic disorders of mitral, aortic and tricuspid valves: Secondary | ICD-10-CM | POA: Insufficient documentation

## 2019-05-14 DIAGNOSIS — I493 Ventricular premature depolarization: Secondary | ICD-10-CM | POA: Insufficient documentation

## 2019-05-14 DIAGNOSIS — R002 Palpitations: Secondary | ICD-10-CM | POA: Diagnosis not present

## 2019-05-14 NOTE — Progress Notes (Signed)
*  PRELIMINARY RESULTS* Echocardiogram 2D Echocardiogram has been performed.  Samuel Germany 05/14/2019, 12:02 PM

## 2019-05-21 DIAGNOSIS — R002 Palpitations: Secondary | ICD-10-CM | POA: Diagnosis not present

## 2019-05-22 ENCOUNTER — Other Ambulatory Visit: Payer: Self-pay

## 2019-05-23 ENCOUNTER — Telehealth: Payer: Self-pay

## 2019-05-23 NOTE — Telephone Encounter (Signed)
Pt notified of and results explained.

## 2019-05-23 NOTE — Telephone Encounter (Signed)
Pt requesting explanation of monitor results.  Please call [phone]   Thanks renee

## 2019-05-24 DIAGNOSIS — K521 Toxic gastroenteritis and colitis: Secondary | ICD-10-CM | POA: Diagnosis not present

## 2019-05-24 DIAGNOSIS — R5383 Other fatigue: Secondary | ICD-10-CM | POA: Diagnosis not present

## 2019-05-24 DIAGNOSIS — E617 Deficiency of multiple nutrient elements: Secondary | ICD-10-CM | POA: Diagnosis not present

## 2019-05-24 DIAGNOSIS — E639 Nutritional deficiency, unspecified: Secondary | ICD-10-CM | POA: Diagnosis not present

## 2019-05-24 DIAGNOSIS — D519 Vitamin B12 deficiency anemia, unspecified: Secondary | ICD-10-CM | POA: Diagnosis not present

## 2019-05-24 DIAGNOSIS — E785 Hyperlipidemia, unspecified: Secondary | ICD-10-CM | POA: Diagnosis not present

## 2019-05-24 DIAGNOSIS — E039 Hypothyroidism, unspecified: Secondary | ICD-10-CM | POA: Diagnosis not present

## 2019-05-24 DIAGNOSIS — E782 Mixed hyperlipidemia: Secondary | ICD-10-CM | POA: Diagnosis not present

## 2019-05-24 DIAGNOSIS — E509 Vitamin A deficiency, unspecified: Secondary | ICD-10-CM | POA: Diagnosis not present

## 2019-05-24 DIAGNOSIS — E539 Vitamin B deficiency, unspecified: Secondary | ICD-10-CM | POA: Diagnosis not present

## 2019-05-24 DIAGNOSIS — E559 Vitamin D deficiency, unspecified: Secondary | ICD-10-CM | POA: Diagnosis not present

## 2019-05-24 DIAGNOSIS — R7309 Other abnormal glucose: Secondary | ICD-10-CM | POA: Diagnosis not present

## 2019-05-24 DIAGNOSIS — R799 Abnormal finding of blood chemistry, unspecified: Secondary | ICD-10-CM | POA: Diagnosis not present

## 2019-05-31 LAB — BASIC METABOLIC PANEL
BUN: 17 (ref 4–21)
CO2: 24 — AB (ref 13–22)
Chloride: 103 (ref 99–108)
Creatinine: 0.7 (ref 0.5–1.1)
Glucose: 77
Potassium: 5 (ref 3.4–5.3)
Sodium: 139 (ref 137–147)

## 2019-05-31 LAB — HEPATIC FUNCTION PANEL
ALT: 21 (ref 7–35)
AST: 26 (ref 13–35)
Alkaline Phosphatase: 82 (ref 25–125)
Bilirubin, Total: 0.4

## 2019-05-31 LAB — COMPREHENSIVE METABOLIC PANEL
Albumin: 4.4 (ref 3.5–5.0)
Calcium: 9.6 (ref 8.7–10.7)
Globulin: 2.7

## 2019-05-31 LAB — CBC AND DIFFERENTIAL
HCT: 41 (ref 36–46)
Hemoglobin: 13.4 (ref 12.0–16.0)
Platelets: 237 (ref 150–399)
WBC: 7.4

## 2019-05-31 LAB — IRON,TIBC AND FERRITIN PANEL: Ferritin: 95

## 2019-05-31 LAB — LIPID PANEL
Cholesterol: 230 — AB (ref 0–200)
HDL: 89 — AB (ref 35–70)
LDL Cholesterol: 122
Triglycerides: 110 (ref 40–160)

## 2019-05-31 LAB — CBC: RBC: 4.5 (ref 3.87–5.11)

## 2019-05-31 LAB — TSH: TSH: 1.65 (ref 0.41–5.90)

## 2019-05-31 LAB — VITAMIN D 25 HYDROXY (VIT D DEFICIENCY, FRACTURES): Vit D, 25-Hydroxy: 63.6

## 2019-05-31 LAB — HEMOGLOBIN A1C: Hemoglobin A1C: 5.4

## 2019-06-10 DIAGNOSIS — D1801 Hemangioma of skin and subcutaneous tissue: Secondary | ICD-10-CM | POA: Diagnosis not present

## 2019-06-10 DIAGNOSIS — L719 Rosacea, unspecified: Secondary | ICD-10-CM | POA: Diagnosis not present

## 2019-07-04 DIAGNOSIS — R69 Illness, unspecified: Secondary | ICD-10-CM | POA: Diagnosis not present

## 2019-07-31 DIAGNOSIS — M1991 Primary osteoarthritis, unspecified site: Secondary | ICD-10-CM | POA: Diagnosis not present

## 2019-07-31 DIAGNOSIS — E7849 Other hyperlipidemia: Secondary | ICD-10-CM | POA: Diagnosis not present

## 2019-07-31 DIAGNOSIS — E039 Hypothyroidism, unspecified: Secondary | ICD-10-CM | POA: Diagnosis not present

## 2019-08-19 ENCOUNTER — Ambulatory Visit (INDEPENDENT_AMBULATORY_CARE_PROVIDER_SITE_OTHER): Payer: Medicare HMO | Admitting: Nurse Practitioner

## 2019-08-19 ENCOUNTER — Encounter: Payer: Self-pay | Admitting: Nurse Practitioner

## 2019-08-19 VITALS — BP 112/70 | HR 75 | Temp 97.1°F | Ht 65.0 in | Wt 120.0 lb

## 2019-08-19 DIAGNOSIS — Z789 Other specified health status: Secondary | ICD-10-CM | POA: Diagnosis not present

## 2019-08-19 DIAGNOSIS — E039 Hypothyroidism, unspecified: Secondary | ICD-10-CM | POA: Insufficient documentation

## 2019-08-19 DIAGNOSIS — R2689 Other abnormalities of gait and mobility: Secondary | ICD-10-CM

## 2019-08-19 DIAGNOSIS — R636 Underweight: Secondary | ICD-10-CM

## 2019-08-19 DIAGNOSIS — E038 Other specified hypothyroidism: Secondary | ICD-10-CM | POA: Diagnosis not present

## 2019-08-19 DIAGNOSIS — E2839 Other primary ovarian failure: Secondary | ICD-10-CM | POA: Diagnosis not present

## 2019-08-19 DIAGNOSIS — E063 Autoimmune thyroiditis: Secondary | ICD-10-CM | POA: Diagnosis not present

## 2019-08-19 LAB — T3, FREE: T3, Free: 1.8

## 2019-08-19 LAB — T3 UPTAKE: T3 Uptake: 26

## 2019-08-19 LAB — T4 (THYROXINE), TOTAL (REFL): T4, Total: 6.9

## 2019-08-19 NOTE — Progress Notes (Signed)
Careteam: Patient Care Team: [name], NP as PCP - General (Geriatric Medicine) [name], MD as PCP - Cardiology (Cardiology) [name], MD as Referring Physician (Dermatology) [name], DO (Optometry) [name], MD as Consulting Physician (Otolaryngology)  PLACE OF SERVICE:  [place] Directive information Does Patient Have a Medical Advance Directive?: Yes, Type of Advance Directive: [place];Living will, Does patient want to make changes to medical advance directive?: No - Patient declined  No Known Allergies  Chief Complaint  Patient presents with   Establish Care    New patient establish care. Here with daughter Magda Paganini   Gait Problem    Balance issues x 2 years, progressively getting worse. Seen ENT in the past   [place] in the morning    Zoned Out    Patient appears zoned out at time, observed by daughter.      HPI: Patient is a 79 y.o. female to establish care.   Hypothyroid- hx of hashimoto's, on levothyroxine 75 mcg; last TSH at goal    She is vegan and takes b12 daily and omega-3 fatty acids   Went to a functional medicine Dr Modena Nunnery (consult) who told her she needed to go gluten free and advised her to take a lithium supplement, which is not a prescription. To take a 60 day supply and then she would be done.  The lithium works as a carrier for her b12.   Constipation- controlled on magnesium   Magnesium also helps her with sleep.   Using vit D 5000 supplement.   Neuropathy of bilateral feet  AWV due to August.   Reports she has issues with balance and when she walks she fells like she is leaning to the left and when she is walking she fells like her body is going over her feet. Has to hold on to the buggy at the grocery store.  Does not feel like her neuropathy- does not feel like it is feet related.  Thought possible heart related but had cardiology evaluation and was  normal.  Issues with low blood sugar if she does not eat like she should.  Zoned out a lot, "half asleep" Discussed protein intake- per daughter "eats like a bird" Got a nutritionist to help with her protein which was beneficial in the past but not following recommendations at this time (it was several years ago)     Review of Systems:  Review of Systems  Constitutional: Negative for chills, fever and weight loss.  Eyes: Negative for blurred vision and double vision.       Macular degeneration.   Respiratory: Negative for cough, sputum production and shortness of breath.   Cardiovascular: Negative for chest pain, palpitations and leg swelling.  Gastrointestinal: Negative for abdominal pain, constipation, diarrhea and heartburn.  Genitourinary: Negative for dysuria, frequency and urgency.  Musculoskeletal: Negative for back pain, falls, joint pain and myalgias.  Skin: Negative.   Neurological: Positive for tingling and sensory change. Negative for dizziness, weakness and headaches.  Endo/Heme/Allergies: Positive for environmental allergies.  Psychiatric/Behavioral: Negative for depression and memory loss. The patient does not have insomnia.     Past Medical History:  Diagnosis Date   Hypothyroidism    Hashimoto's per [facility] New Patient Packet    Neuropathy    Osteoarthritis    Per patient at New patient appointment    Seasonal allergies    Past Surgical History:  Procedure Laterality Date  No previous surgery     Social History:   reports that she has never smoked. She has never used smokeless tobacco. She reports previous alcohol use. She reports that she does not use drugs.  Family History  Problem Relation Age of Onset   Heart disease Mother    Heart failure Mother    Heart disease Father    Prostate cancer Father    Heart disease Daughter    Diabetes Daughter    Neurologic Disorder Daughter     Medications: Patient's Medications  New Prescriptions    No medications on file  Previous Medications   B COMPLEX VITAMINS TABLET    Take 1 tablet by mouth daily.   CHOLECALCIFEROL (VITAMIN D-3) 125 MCG (5000 UT) TABS    Take 1 tablet by mouth daily.   CYANOCOBALAMIN (B-12) 5000 MCG CAPS    Take 1 capsule by mouth daily.   LEVOTHYROXINE (SYNTHROID) 75 MCG TABLET    Take 75 mcg by mouth daily before breakfast.   LITHIUM PO    Take 1 capsule by mouth daily.   MAGNESIUM 250 MG TABS    Take 1 tablet by mouth daily.   MULTIPLE VITAMINS-MINERALS (ICAPS) TABS    Take 1 tablet by mouth daily.   OMEGA-3 FATTY ACIDS (OMEGA-3 PO)    Take 1 tablet by mouth daily.   UNABLE TO FIND    Take 1 tablet by mouth daily. Med Name: Trace Minerals   UNABLE TO FIND    Take 1 tablet by mouth daily. Med Name: CDP (memory) 300 mg  Modified Medications   No medications on file  Discontinued Medications   No medications on file    Physical Exam:  Vitals:   08/19/19 1325  BP: 112/70  Pulse: 75  Temp: (!) 97.1 F (36.2 C)  TempSrc: Temporal  SpO2: 97%  Weight: 120 lb (54.4 kg)  Height: 5\' 5"  (1.651 m)   Body mass index is 19.97 kg/m. Wt Readings from Last 3 Encounters:  08/19/19 120 lb (54.4 kg)  05/08/19 125 lb 6.4 oz (56.9 kg)    Physical Exam Constitutional:      General: She is not in acute distress.    Appearance: She is well-developed. She is not diaphoretic.  HENT:     Head: Normocephalic and atraumatic.     Mouth/Throat:     Pharynx: No oropharyngeal exudate.  Eyes:     Conjunctiva/sclera: Conjunctivae normal.     Pupils: Pupils are equal, round, and reactive to light.  Cardiovascular:     Rate and Rhythm: Normal rate and regular rhythm.     Heart sounds: Normal heart sounds.  Pulmonary:     Effort: Pulmonary effort is normal.     Breath sounds: Normal breath sounds.  Abdominal:     General: Bowel sounds are normal.     Palpations: Abdomen is soft.  Musculoskeletal:        General: No tenderness.     Cervical back: Normal range of  motion and neck supple.  Skin:    General: Skin is warm and dry.  Neurological:     General: No focal deficit present.     Mental Status: She is alert and oriented to person, place, and time.     Cranial Nerves: No cranial nerve deficit.     Motor: No weakness.     Coordination: Coordination normal.     Gait: Gait normal.  Psychiatric:        Mood and  Affect: Mood normal.      Labs reviewed: Basic Metabolic Panel: Recent Labs    04/05/19 0000 05/31/19 0000  NA  --  139  K  --  5.0  CL  --  103  CO2  --  24*  BUN  --  17  CREATININE  --  0.7  CALCIUM  --  9.6  TSH 4.40 1.65   Liver Function Tests: Recent Labs    05/31/19 0000  AST 26  ALT 21  ALKPHOS 82  ALBUMIN 4.4   No results for input(s): LIPASE, AMYLASE in the last 8760 hours. No results for input(s): AMMONIA in the last 8760 hours. CBC: Recent Labs    05/31/19 0000  WBC 7.4  HGB 13.4  HCT 41  PLT 237   Lipid Panel: Recent Labs    05/31/19 0000  CHOL 230*  HDL 89*  LDLCALC 122  TRIG 110   TSH: Recent Labs    04/05/19 0000 05/31/19 0000  TSH 4.40 1.65   A1C: Lab Results  Component Value Date   HGBA1C 5.4 05/31/2019     Assessment/Plan 1. Estrogen deficiency - DG Bone Density; Future  2. Imbalance -ongoing, reports she has had ENT and cardiology evaluation that have not found reason for balance issues, gait normal in office today and neuro exam WNL. - Ambulatory referral to Physical Therapy for evaluation and treatment at this time -also encouraged proper nutrition and hydration -if PT without benefit or worsening of symptoms will get neuro referral.   3. Vegan -has limited diet, feels like it is hard to get adequate protein in. She does not like the idea of eating meat therefore she is is unable to.  - Amb ref to Medical Nutrition Therapy-MNT   4. Underweight Has had significant weight loss over the past few years per daughter, appears to have had 5 lbs since April per epic.  Needing to increase protein and calories in diet. Discussed this with pt and daughter today. They would like a referral to nutritionist for a plan.  - Amb ref to Medical Nutrition Therapy-MNT  5. Hypothyroidism due to Hashimoto's thyroiditis Stable on synthroid 75 mcg  Next appt: 6 weeks for AWV, 3 months for follow up [place]. [place], [facility] [phone]

## 2019-08-19 NOTE — Patient Instructions (Signed)
To schedule AWV (telephone) in mid-late august  Follow up in 3 months for routine follow up.

## 2019-08-21 ENCOUNTER — Encounter: Payer: Self-pay | Admitting: Nurse Practitioner

## 2019-08-27 ENCOUNTER — Encounter: Payer: Self-pay | Admitting: Nutrition

## 2019-08-27 ENCOUNTER — Encounter: Payer: Medicare HMO | Attending: Nurse Practitioner | Admitting: Nutrition

## 2019-08-27 ENCOUNTER — Other Ambulatory Visit: Payer: Self-pay

## 2019-08-27 VITALS — Ht 65.0 in | Wt 121.6 lb

## 2019-08-27 DIAGNOSIS — Z681 Body mass index (BMI) 19 or less, adult: Secondary | ICD-10-CM | POA: Insufficient documentation

## 2019-08-27 DIAGNOSIS — E032 Hypothyroidism due to medicaments and other exogenous substances: Secondary | ICD-10-CM | POA: Insufficient documentation

## 2019-08-27 DIAGNOSIS — M6281 Muscle weakness (generalized): Secondary | ICD-10-CM | POA: Insufficient documentation

## 2019-08-27 NOTE — Progress Notes (Signed)
Medical Nutrition Therapy:  Appt start time: 1300 end time:  1400. Vegetarian.  Assessment:  Primary concerns today:  Nutritional status and weight gain. PMH Hypothyroidism. Takes B12 and Vit D 3 for Vit D deficiency. Takes lithium also. Is taking prednisone right now for 5 days.   LIves by herslf.  Vegetarian [place] OVO. Here is with her daughter.  She complains of weakness, dizziness and losing her balance at times. Wants to know if her diet is possibly causing her problems. Sometimes she feels her blood sugar is low and she gets a fast heart rate in the morning and shaky at times. No known diagnosis of DM. A1C 5.4%. Cholesterol is elevated. Use [employer] Wt is stable. Food choices are healthy options, but may lack in overall calories for weight gain and meet her nutritional needs. Appetite is fair. She doesn't eat much at one time but eats small, frequent meals. Weight fluctuates about 120-122 lbs. IBW would be 125-130 lbs. She use to weigh 140 lbs before her husband died 5+ years ago. Doesn't like to cook for just herself. Eats alone and doesn't get hungry at times to eat.  Denies depression. Doesn't want to go around other people often due to virus. Has some lady friends she goes out to eat weekly with.  CMP Latest Ref Rng & Units 05/31/2019 08/30/2018 01/13/2018  BUN 4 - 21 17 15 13   Creatinine 0.5 - 1.1 0.7 0.8 0.8  Sodium 137 - 147 139 139 141  Potassium 3.4 - 5.3 5.0 4.7 4.9  Chloride 99 - 108 103 100 103  CO2 13 - 22 24(A) 25(A) 26(A)  Calcium 8.7 - 10.7 9.6 9.9 9.6  Alkaline Phos 25 - 125 82 86 82  AST 13 - 35 26 25 22   ALT 7 - 35 21 16 13    Lipid Panel     Component Value Date/Time   CHOL 230 (A) 05/31/2019 0000   TRIG 110 05/31/2019 0000   HDL 89 (A) 05/31/2019 0000   LDLCALC 122 05/31/2019 0000     Preferred Learning Style   No preference indicated   Learning Readiness:   Ready  Change in progress   MEDICATIONS: See chart   DIETARY  INTAKE:   24-hr recall:  B (  7 AM): 3/4 cPlain greek yogurt and few teaspoons with granola, OJ and 1 slice of toast, black coffee Premeir protein shake, herbal tea, water,  1/2 PB and pickles,  Or egg and tomato sandwich,  Snacks: D) potaoes, green beans and carrots, water Premeir shake Usual physical activity:    Estimated energy needs: 1500-1600  kcals  calories 170 g carbohydrates 90/112 g protein 42 g fat  Progress Towards Goal(s):  In progress.   Nutritional Diagnosis:  NI-1.4 Inadequate energy intake As related to undereating.  As evidenced by BMI 19..    Intervention:  Heart Healthy meal plan using MY Plate. Good quality vegan protein sources, importance of balanced meals, hydration and weight gain. Portion sizes, healthy snacks, supplements to drink at night..   Goals  Follow MY Plate Eat 5-6 small meals per day-see handout given. Keep drinking water Add high calorie high protein snacks between meals or at night before bed. May drink a protein shake before  Bed. Don't skip meals. Goal weight should be 135-130 lbs.  Teaching Method Utilized:  Visual Auditory Hands on  Handouts given during visit include:  Vegetarian Handout  How to increase fiber and protein.   My Plate, Meal Plan  Card   Barriers to learning/adherence to lifestyle change: none  Demonstrated degree of understanding via:  Teach Back   Monitoring/Evaluation:  Dietary intake, exercise, , and body weight in  PRN  Recommend PT therapy and neurology work up to rule out mini/mild strokes.

## 2019-08-28 ENCOUNTER — Ambulatory Visit
Admission: EM | Admit: 2019-08-28 | Discharge: 2019-08-28 | Disposition: A | Discharge status: Home / Self Care | Payer: Medicare HMO | Attending: Emergency Medicine | Admitting: Emergency Medicine

## 2019-08-28 ENCOUNTER — Encounter: Payer: Self-pay | Admitting: Emergency Medicine

## 2019-08-28 DIAGNOSIS — J029 Acute pharyngitis, unspecified: Secondary | ICD-10-CM

## 2019-08-28 DIAGNOSIS — H9201 Otalgia, right ear: Secondary | ICD-10-CM

## 2019-08-28 MED ORDER — PREDNISONE 20 MG PO TABS: 20.0000 mg | ORAL_TABLET | Freq: Every day | ORAL | 0 refills | Status: AC

## 2019-08-28 NOTE — Discharge Instructions (Signed)
Declines COVID Get plenty of rest and push fluids Try incorporating zyrtec, allegra, or claritin daily for relief of sore throat Prednisone prescribed.  Take as directed Drink warm or cool liquids, use throat lozenges, or popsicles to help alleviate symptoms Take OTC ibuprofen or tylenol as needed for pain Follow up with PCP if symptoms persists Return or go to ER if patient has any new or worsening symptoms such as fever, chills, nausea, vomiting, worsening sore throat, cough, abdominal pain, chest pain, changes in bowel or bladder habits, etc..Marland Kitchen

## 2019-08-28 NOTE — ED Provider Notes (Signed)
[place]   [id number] 08/28/19 Arrival Time: 5681  EX:NTZG THROAT  SUBJECTIVE: History from: patient.  Nancy Williams is a 79 y.o. female who presents with sore throat and LT ear pain x 1 day.  Admits to sick exposure to sister-in-law with a cold.  Has tried gargling and ibuprofen with relief.  Symptoms are made worse with swallowing, but tolerating liquids and own secretions without difficulty.  Reports previous symptoms in the past.  Denies fever, chills, fatigue, sinus pain, rhinorrhea, nasal congestion, cough, SOB, wheezing, chest pain, nausea, rash, changes in bowel or bladder habits.    ROS: As per HPI.  All other pertinent ROS negative.     Past Medical History:  Diagnosis Date  . Hypothyroidism    Hashimoto's per [facility] New Patient Packet   . Neuropathy   . Osteoarthritis    Per patient at New patient appointment   . Seasonal allergies    Past Surgical History:  Procedure Laterality Date  . No previous surgery     No Known Allergies No current facility-administered medications on file prior to encounter.   Current Outpatient Medications on File Prior to Encounter  Medication Sig Dispense Refill  . b complex vitamins tablet Take 1 tablet by mouth daily.    . Cholecalciferol (VITAMIN D-3) 125 MCG (5000 UT) TABS Take 1 tablet by mouth daily.    . Cyanocobalamin (B-12) 5000 MCG CAPS Take 1 capsule by mouth daily.    Marland Kitchen levothyroxine (SYNTHROID) 75 MCG tablet Take 75 mcg by mouth daily before breakfast.    . LITHIUM PO Take 1 capsule by mouth daily.    . Magnesium 250 MG TABS Take 1 tablet by mouth daily.    . Multiple Vitamins-Minerals (ICAPS) TABS Take 1 tablet by mouth daily.    . Omega-3 Fatty Acids (OMEGA-3 PO) Take 1 tablet by mouth daily.    Marland Kitchen UNABLE TO FIND Take 1 tablet by mouth daily. Med Name: Trace Minerals    . UNABLE TO FIND Take 1 tablet by mouth daily. Med Name: CDP (memory) 300 mg     Social History   Socioeconomic History  . Marital status:  Widowed    Spouse name: Not on file  . Number of children: Not on file  . Years of education: Not on file  . Highest education level: Not on file  Occupational History  . Not on file  Tobacco Use  . Smoking status: Never Smoker  . Smokeless tobacco: Never Used  Vaping Use  . Vaping Use: Never used  Substance and Sexual Activity  . Alcohol use: Not Currently    Comment: Occasional  . Drug use: Never  . Sexual activity: Not on file  Other Topics Concern  . Not on file  Social History Narrative   Per [facility] New Patient Packet Abstracted on 08/19/2019:      Diet: Vegetarian, gluten free       Caffeine: Coffee & Tea      Married, if yes what year: Widow, 1959      Do you live in a house, apartment, assisted living, condo, trailer, ect: House      Is it one or more stories: One stories, one person       Pets: 4 cats       Current/Past profession: Left Blank      Highest level or education completed: 12 th grade       Exercise:        Try  Type and how often: Need to clarify, unable to read what patient wrote          Living Will: Yes   DNR: Yes   POA/HPOA: Yes      Functional Status:   Do you have difficulty bathing or dressing yourself? No   Do you have difficulty preparing food or eating?No   Do you have difficulty managing your medications? No   Do you have difficulty managing your finances? No   Do you have difficulty affording your medications? No   Social Determinants of Health   Financial Resource Strain:   . Difficulty of Paying Living Expenses:   Food Insecurity:   . Worried About Charity fundraiser in the Last Year:   . Arboriculturist in the Last Year:   Transportation Needs:   . Film/video editor (Medical):   Marland Kitchen Lack of Transportation (Non-Medical):   Physical Activity:   . Days of Exercise per Week:   . Minutes of Exercise per Session:   Stress:   . Feeling of Stress :   Social Connections:   . Frequency of Communication with Friends  and Family:   . Frequency of Social Gatherings with Friends and Family:   . Attends Religious Services:   . Active Member of Clubs or Organizations:   . Attends Archivist Meetings:   Marland Kitchen Marital Status:   Intimate Partner Violence:   . Fear of Current or Ex-Partner:   . Emotionally Abused:   Marland Kitchen Physically Abused:   . Sexually Abused:    Family History  Problem Relation Age of Onset  . Heart disease Mother   . Heart failure Mother   . Heart disease Father   . Prostate cancer Father   . Heart disease Daughter   . Diabetes Daughter   . Neurologic Disorder Daughter     OBJECTIVE:  Vitals:   08/28/19 1021 08/28/19 1022  BP:  (!) 113/59  Resp:  17  Temp:  98.9 F (37.2 C)  TempSrc:  Oral  SpO2:  96%  Weight: 120 lb (54.4 kg)   Height: 5\' 6"  (1.676 m)      General appearance: alert; well-appearing, nontoxic, speaking in full sentences and managing own secretions HEENT: NCAT; Ears: EACs clear, TMs pearly gray with visible cone of light, without erythema; Eyes: PERRL, EOMI grossly; Nose: no obvious rhinorrhea; Throat: oropharynx clear, tonsils 1+ and mildly erythematous without white tonsillar exudates, uvula midline Neck: supple without LAD Lungs: CTA bilaterally without adventitious breath sounds; cough absent Heart: regular rate and rhythm.   Skin: warm and dry Psychological: alert and cooperative; normal mood and affect  ASSESSMENT & PLAN:  1. Sore throat   2. Right ear pain     Meds ordered this encounter  Medications  . predniSONE (DELTASONE) 20 MG tablet    Sig: Take 1 tablet (20 mg total) by mouth daily with breakfast for 5 days.    Dispense:  5 tablet    Refill:  0    Order Specific Question:   Supervising Provider    Answer:   Raylene Everts [[id number]]   Declines COVID Get plenty of rest and push fluids Try incorporating zyrtec, allegra, or claritin daily for relief of sore throat Prednisone prescribed.  Take as directed Drink warm or cool  liquids, use throat lozenges, or popsicles to help alleviate symptoms Take OTC ibuprofen or tylenol as needed for pain Follow up with PCP if symptoms persists Return or  go to ER if patient has any new or worsening symptoms such as fever, chills, nausea, vomiting, worsening sore throat, cough, abdominal pain, chest pain, changes in bowel or bladder habits, etc...  Reviewed expectations re: course of current medical issues. Questions answered. Outlined signs and symptoms indicating need for more acute intervention. Patient verbalized understanding. After Visit Summary given.        Lestine Box, PA-C 08/28/19 1040

## 2019-08-28 NOTE — ED Triage Notes (Signed)
RT ear pain and sore throat that started yesterday

## 2019-08-29 ENCOUNTER — Encounter: Payer: Self-pay | Admitting: Nutrition

## 2019-08-29 NOTE — Patient Instructions (Addendum)
Goals  Follow MY Plate Eat 5-6 small meals per day-see handout given. Keep drinking water Add high calorie high protein snacks between meals or at night before bed. May drink a protein shake before  Bed. Don't skip meals. Goal weight should be 135-130 lbs.

## 2019-08-30 ENCOUNTER — Ambulatory Visit (HOSPITAL_COMMUNITY)
Admission: RE | Admit: 2019-08-30 | Discharge: 2019-08-30 | Disposition: A | Discharge status: Home / Self Care | Payer: Medicare HMO | Source: Ambulatory Visit | Attending: Nurse Practitioner | Admitting: Nurse Practitioner

## 2019-08-30 ENCOUNTER — Other Ambulatory Visit: Payer: Self-pay

## 2019-08-30 DIAGNOSIS — R29890 Loss of height: Secondary | ICD-10-CM | POA: Diagnosis not present

## 2019-08-30 DIAGNOSIS — E2839 Other primary ovarian failure: Secondary | ICD-10-CM | POA: Insufficient documentation

## 2019-08-30 DIAGNOSIS — Z780 Asymptomatic menopausal state: Secondary | ICD-10-CM | POA: Diagnosis not present

## 2019-08-30 DIAGNOSIS — M8589 Other specified disorders of bone density and structure, multiple sites: Secondary | ICD-10-CM | POA: Diagnosis not present

## 2019-09-03 ENCOUNTER — Other Ambulatory Visit: Payer: Self-pay

## 2019-09-03 ENCOUNTER — Ambulatory Visit (HOSPITAL_COMMUNITY): Payer: Medicare HMO | Attending: Nurse Practitioner | Admitting: Physical Therapy

## 2019-09-03 ENCOUNTER — Encounter (HOSPITAL_COMMUNITY): Payer: Self-pay | Admitting: Physical Therapy

## 2019-09-03 DIAGNOSIS — M6281 Muscle weakness (generalized): Secondary | ICD-10-CM | POA: Insufficient documentation

## 2019-09-03 DIAGNOSIS — R2681 Unsteadiness on feet: Secondary | ICD-10-CM | POA: Insufficient documentation

## 2019-09-03 NOTE — Therapy (Signed)
[facility] [address] [place], [place], [address] Phone: [phone]   Fax:  [phone]  Physical Therapy Evaluation  Patient Details  Name: Nancy Williams MRN: [id number] Date of Birth: 02/09/1940 Referring Provider (PT): Sherrie Mustache, NP   Encounter Date: 09/03/2019   PT End of Session - 09/03/19 1524    Visit Number 1    Number of Visits 4    Date for PT Re-Evaluation 10/01/19    Authorization Type [employer] Medicare (No auth, visits based on medical necessity)    Progress Note Due on Visit 4    PT Start Time 1435    PT Stop Time 1511    PT Time Calculation (min) 36 min    Activity Tolerance Patient tolerated treatment well    Behavior During Therapy [facility] for tasks assessed/performed           Past Medical History:  Diagnosis Date  . Hypothyroidism    Hashimoto's per [facility] New Patient Packet   . Neuropathy   . Osteoarthritis    Per patient at New patient appointment   . Seasonal allergies     Past Surgical History:  Procedure Laterality Date  . No previous surgery      There were no vitals filed for this visit.    Subjective Assessment - 09/03/19 1438    Subjective Patient reported that when she walks she thinks that she is walking straight, but she starts to veer to the side. She stated that this feeling started about 2 years ago and that it was gradual. She stated that it only bothers her when she is walking.  She said that she went to ENT and they thought she might have vertigo, but that they did the maneuver and it didn't help. She reported that she has seen several other doctors regarding this. She reported that she had her thyroid checked and that her thyroid seems to be ok. She stated that some days are not that bad. She reported that she gets a jittery feeling in her chest in the morning, and she went to her cardiologist, but that they said her heart seems ok. She denied any falls in the last couple years.     Pertinent History Insidious onset of veering while ambulating    Diagnostic tests Cervical x-ray: Negative    Patient Stated Goals To get rid of this feeling whatever it is    Currently in Pain? No/denies              [facility] PT Assessment - 09/03/19 0001      Assessment   Medical Diagnosis Imbalance    Referring Provider (PT) Sherrie Mustache, NP    Onset Date/Surgical Date --   A couple years   Next MD Visit 09/18/19    Prior Therapy Yes, for Shoulder      Precautions   Precautions None      Restrictions   Weight Bearing Restrictions No      Balance Screen   Has the patient fallen in the past 6 months No      [place] residence    Type of [place] to enter    Entrance Stairs-Number of Steps 5    Entrance Stairs-Rails Right      Prior Function   Level of Independence Independent    Vocation Retired    Leisure Tai chi and line dancing and Qigong  Cognition   Overall Cognitive Status Within Functional Limits for tasks assessed      Observation/Other Assessments   Focus on Therapeutic Outcomes (FOTO)  N/A      ROM / Strength   AROM / PROM / Strength Strength      Strength   Strength Assessment Site Hip;Knee;Ankle    Right/Left Hip Right;Left    Right Hip Flexion 4+/5    Right Hip Extension 4+/5    Right Hip ABduction 5/5    Left Hip Flexion 4+/5    Left Hip Extension 4+/5    Left Hip ABduction 5/5    Right/Left Knee Right;Left    Right Knee Flexion 5/5    Right Knee Extension 5/5    Left Knee Flexion 5/5    Left Knee Extension 5/5    Right/Left Ankle Right;Left    Right Ankle Dorsiflexion 5/5    Left Ankle Dorsiflexion 5/5      Ambulation/Gait   Ambulation/Gait Yes    Ambulation/Gait Assistance 5: Supervision    Ambulation Distance (Feet) 350 Feet   2MWT   Assistive device None    Gait Comments Patient appears somewhat hesitant and occassionally reaching for the wall       Balance    Balance Assessed Yes      Static Standing Balance   Static Standing - Balance Support No upper extremity supported    Static Standing Balance -  Activities  Single Leg Stance - Right Leg;Single Leg Stance - Left Leg    Static Standing - Comment/# of Minutes LT 7 seconds; RT 5 seconds                      Objective measurements completed on examination: See above findings.               PT Education - 09/03/19 1524    Education Details Discussed examination findings and POC.    Person(s) Educated Patient    Methods Explanation    Comprehension Verbalized understanding            PT Short Term Goals - 09/03/19 1528      PT SHORT TERM GOAL #1   Title Patient will report understanding with HEP and report regular compliance to improve strength, balance and overall functional mobility.    Time 2    Period Weeks    Status New    Target Date 09/17/19             PT Long Term Goals - 09/03/19 1530      PT LONG TERM GOAL #1   Title Patient will demonstrate ability to maintain SLS on bilateral LEs for at least 10 seconds indicating improved stability and balance.    Time 4    Period Weeks    Status New    Target Date 10/01/19      PT LONG TERM GOAL #2   Title Patient will report an improvement in overall subjective complaint of at least 50% for improved QoL.    Time 4    Period Weeks    Status New    Target Date 10/01/19      PT LONG TERM GOAL #3   Title Patient will demonstrate ability to ambulate on 2MWT without reports of veering or having to reach for surrounding walls.    Time 4    Period Weeks    Status New    Target Date 10/01/19  Plan - 09/03/19 1537    Clinical Impression Statement Patient is an 79 year old female who presents to outpatient physical therapy with primary complaint of veering while ambulating. She presents with decreased balance and evidence of some veering while ambulating as well as some  hesitancy with ambulation. Patient demonstrates some minimal weakness in bilateral lower extremities. Clonus was tested and found to be negative bilaterally. Discussed with the patient a plan to continue with therapy once a week to continue to assess and trial working on balance and strengthening exercises. Educated on the vestibular system and plan to have patient work with a therapist in the clinic who is more experienced with vestibular disorders next visit to assess patient as needed. Patient would benefit from continued skilled physical therapy in order to address the abovementioned deficits and help patient return to her PLOF.    Personal Factors and Comorbidities Age;Time since onset of injury/illness/exacerbation;Comorbidity 1    Comorbidities Hypothyroidism    Examination-Activity Limitations Locomotion Level    Examination-Participation Restrictions Community Activity;Yard Work    Merchant navy officer Stable/Uncomplicated    Designer, jewellery Low    Rehab Potential Fair    PT Frequency 1x / week    PT Duration 4 weeks    PT Treatment/Interventions ADLs/Self Care Home Management;Aquatic Therapy;Canalith Repostioning;Cryotherapy;Electrical Stimulation;Biofeedback;Moist Heat;Traction;Ultrasound;DME Instruction;Gait training;Stair training;Functional mobility training;Therapeutic activities;Therapeutic exercise;Balance training;Neuromuscular re-education;Patient/family education;Manual techniques;Passive range of motion;Dry needling;Energy conservation;Taping    PT Next Visit Plan Continue assessment as needed checking vestibular system as needed. Focus on balance exercises particularly dynamic balance exercises and functional strengthening exercises. Provide HEP including: SLS at counter, STS, and supine SLR.    PT Home Exercise Plan Initiate next session    Consulted and Agree with Plan of Care Patient           Patient will benefit from skilled therapeutic  intervention in order to improve the following deficits and impairments:  Abnormal gait, Decreased activity tolerance, Decreased endurance, Decreased strength, Decreased balance, Difficulty walking  Visit Diagnosis: Unsteadiness on feet  Muscle weakness (generalized)     Problem List Patient Active Problem List   Diagnosis Date Noted  . Hypothyroidism   . Muscle weakness (generalized) 02/21/2012  . Pain in joint, shoulder region 02/16/2012   [name] PT, DPT 3:49 PM, 09/03/19 [place], [place], [address] Phone: [phone]   Fax:  [phone]  Name: Nancy Williams MRN: [id number] Date of Birth: February 20, 1940

## 2019-09-05 ENCOUNTER — Encounter: Payer: Self-pay | Admitting: Nurse Practitioner

## 2019-09-05 DIAGNOSIS — E785 Hyperlipidemia, unspecified: Secondary | ICD-10-CM | POA: Insufficient documentation

## 2019-09-10 ENCOUNTER — Encounter (HOSPITAL_COMMUNITY): Payer: Self-pay | Admitting: Physical Therapy

## 2019-09-10 ENCOUNTER — Other Ambulatory Visit: Payer: Self-pay

## 2019-09-10 ENCOUNTER — Ambulatory Visit (HOSPITAL_COMMUNITY): Payer: Medicare HMO | Admitting: Physical Therapy

## 2019-09-10 DIAGNOSIS — M6281 Muscle weakness (generalized): Secondary | ICD-10-CM

## 2019-09-10 DIAGNOSIS — R2681 Unsteadiness on feet: Secondary | ICD-10-CM

## 2019-09-10 NOTE — Therapy (Signed)
[facility] [place], [place], [address] Phone: [phone]   Fax:  [phone]  Physical Therapy Treatment  Patient Details  Name: Nancy Williams MRN: [id number] Date of Birth: 02-Feb-1940 Referring Provider (PT): Sherrie Mustache, NP   Encounter Date: 09/10/2019   PT End of Session - 09/10/19 1431    Visit Number 2    Number of Visits 4    Date for PT Re-Evaluation 10/01/19    Authorization Type [employer]na Medicare (No auth, visits based on medical necessity)    Progress Note Due on Visit 4    PT Start Time 1345    PT Stop Time 1430    PT Time Calculation (min) 45 min    Activity Tolerance Patient tolerated treatment well    Behavior During Therapy [facility] for tasks assessed/performed           Past Medical History:  Diagnosis Date  . Hypothyroidism    Hashimoto's per [facility] New Patient Packet   . Neuropathy   . Osteoarthritis    Per patient at New patient appointment   . Seasonal allergies     Past Surgical History:  Procedure Laterality Date  . No previous surgery      There were no vitals filed for this visit.   Subjective Assessment - 09/10/19 1357    Subjective Patient reports ongoing comlpaint of gait deviation when walking. Reports no vertigo or diziness otherwise. Says this has not been as bad the past week but can tell it is still there.    Pertinent History Insidious onset of veering while ambulating    Diagnostic tests Cervical x-ray: Negative    Patient Stated Goals To get rid of this feeling whatever it is                   Vestibular Assessment - 09/10/19 0001      Oculomotor Exam   Ocular ROM WFL     Smooth Pursuits Intact    Saccades Intact      Vestibulo-Ocular Reflex   VOR 1 Head Only (x 1 viewing) Normal                          Balance Exercises - 09/10/19 0001      Balance Exercises: Standing   Standing Eyes Opened Narrow base of support (BOS);1 rep;20 secs     Standing Eyes Closed Narrow base of support (BOS);1 rep;20 secs    Tandem Stance Eyes open;2 reps;20 secs    SLS Eyes open;2 reps;15 secs             PT Education - 09/10/19 1456    Education Details on further vestibular and balance assessments, differential diagnosis, exercise technique and HEP handout    Person(s) Educated Patient    Methods Explanation;Handout    Comprehension Verbalized understanding            PT Short Term Goals - 09/03/19 1528      PT SHORT TERM GOAL #1   Title Patient will report understanding with HEP and report regular compliance to improve strength, balance and overall functional mobility.    Time 2    Period Weeks    Status New    Target Date 09/17/19             PT Long Term Goals - 09/03/19 1530      PT LONG TERM GOAL #1   Title  Patient will demonstrate ability to maintain SLS on bilateral LEs for at least 10 seconds indicating improved stability and balance.    Time 4    Period Weeks    Status New    Target Date 10/01/19      PT LONG TERM GOAL #2   Title Patient will report an improvement in overall subjective complaint of at least 50% for improved QoL.    Time 4    Period Weeks    Status New    Target Date 10/01/19      PT LONG TERM GOAL #3   Title Patient will demonstrate ability to ambulate on 2MWT without reports of veering or having to reach for surrounding walls.    Time 4    Period Weeks    Status New    Target Date 10/01/19                 Plan - 09/10/19 1457    Clinical Impression Statement Completed further assessment of vestibular and balance systems today. Assessed eye movement seated and standing, and perform vestibular nerve testing in both positions. Patient with no reported or demonstrated balance disturbance during this testing. Patient reports having [place] performed previously by her Son in [place] which she says had no effect and did not elicit a response. Tested static standing balance with eyes  open and eyes closed. Patient with some decreased stability noted in SLS on LT, but did well in tandem. Patient also with good stability standing with eyes closed in NBOS. Tested gross UE and LE MMT in seated, all were [facility] except RT shoulder flexion which was not tested due to prior shoulder injury. Discussed assessment findings today, and possible differential diagnosis. Also discussed POC to address current presenting deficits. Patient educated on and issued HEP handout.    Personal Factors and Comorbidities Age;Time since onset of injury/illness/exacerbation;Comorbidity 1    Comorbidities Hypothyroidism    Examination-Activity Limitations Locomotion Level    Examination-Participation Restrictions Community Activity;Yard Work    Stability/Clinical Decision Making Stable/Uncomplicated    Rehab Potential Fair    PT Frequency 1x / week    PT Duration 4 weeks    PT Treatment/Interventions ADLs/Self Care Home Management;Aquatic Therapy;Canalith Repostioning;Cryotherapy;Electrical Stimulation;Biofeedback;Moist Heat;Traction;Ultrasound;DME Instruction;Gait training;Stair training;Functional mobility training;Therapeutic activities;Therapeutic exercise;Balance training;Neuromuscular re-education;Patient/family education;Manual techniques;Passive range of motion;Dry needling;Energy conservation;Taping    PT Next Visit Plan Progress to dynamic balance exercises and functional strengthening exercises. Add tandem gait, gait with head turns, sidestepping, retrowalking next session if able    PT Home Exercise Plan 09/10/19: SLS at counter, tandem stance at counter    Consulted and Agree with Plan of Care Patient           Patient will benefit from skilled therapeutic intervention in order to improve the following deficits and impairments:  Abnormal gait, Decreased activity tolerance, Decreased endurance, Decreased strength, Decreased balance, Difficulty walking  Visit Diagnosis: Unsteadiness on  feet  Muscle weakness (generalized)     Problem List Patient Active Problem List   Diagnosis Date Noted  . Hyperlipemia 09/05/2019  . Hypothyroidism   . Muscle weakness (generalized) 02/21/2012  . Pain in joint, shoulder region 02/16/2012    3:06 PM, 09/10/19 Josue Hector PT DPT  Physical Therapist with [facility]  [phone] [place], [place], [address] Phone: [phone]   Fax:  [phone]  Name: Nancy Williams MRN: [id number] Date of  Birth: 03-09-1940

## 2019-09-10 NOTE — Patient Instructions (Signed)
Access Code: [email or web address] URL: [email or web address] Date: 09/10/2019 Prepared by: Josue Hector  Exercises Single Leg Stance with Support - 3 x daily - 7 x weekly - 1 sets - 3 reps - 20 hold Tandem Stance with Support - 3 x daily - 7 x weekly - 1 sets - 3 reps - 20 hold

## 2019-09-18 ENCOUNTER — Encounter: Payer: Self-pay | Admitting: Nurse Practitioner

## 2019-09-18 ENCOUNTER — Telehealth: Payer: Self-pay | Admitting: Cardiology

## 2019-09-18 ENCOUNTER — Ambulatory Visit (INDEPENDENT_AMBULATORY_CARE_PROVIDER_SITE_OTHER): Payer: Medicare HMO | Admitting: Nurse Practitioner

## 2019-09-18 ENCOUNTER — Other Ambulatory Visit: Payer: Self-pay

## 2019-09-18 ENCOUNTER — Telehealth: Payer: Self-pay

## 2019-09-18 DIAGNOSIS — Z0000 Encounter for general adult medical examination without abnormal findings: Secondary | ICD-10-CM | POA: Diagnosis not present

## 2019-09-18 MED ORDER — METOPROLOL SUCCINATE ER 25 MG PO TB24
12.5000 mg | ORAL_TABLET | Freq: Every day | ORAL | 1 refills | Status: DC
Start: 2019-09-18 — End: 2020-02-10

## 2019-09-18 NOTE — Telephone Encounter (Signed)
Patient called back.She will pick up Toprol today.

## 2019-09-18 NOTE — Telephone Encounter (Signed)
[name], kam are scheduled for a virtual visit with your provider today.    Just as we do with appointments in the office, we must obtain your consent to participate.  Your consent will be active for this visit and any virtual visit you may have with one of our providers in the next 365 days.    If you have a MyChart account, I can also send a copy of this consent to you electronically.  All virtual visits are billed to your insurance company just like a traditional visit in the office.  As this is a virtual visit, video technology does not allow for your provider to perform a traditional examination.  This may limit your provider's ability to fully assess your condition.  If your provider identifies any concerns that need to be evaluated in person or the need to arrange testing such as labs, EKG, etc, we will make arrangements to do so.    Although advances in technology are sophisticated, we cannot ensure that it will always work on either your end or our end.  If the connection with a video visit is poor, we may have to switch to a telephone visit.  With either a video or telephone visit, we are not always able to ensure that we have a secure connection.   I need to obtain your verbal consent now.   Are you willing to proceed with your visit today?   Nancy Williams has provided verbal consent on 09/18/2019 for a virtual visit (video or telephone).   [name], [place] 09/18/2019  8:46 AM

## 2019-09-18 NOTE — Telephone Encounter (Signed)
Attempted to reach, lmtcb   E-scribed Toprol XL 12.5 mg qd to [facility]

## 2019-09-18 NOTE — Telephone Encounter (Signed)
I will defer to Dr.McDowell

## 2019-09-18 NOTE — Progress Notes (Signed)
Subjective:   Nancy Williams is a 79 y.o. female who presents for Medicare Annual (Subsequent) preventive examination.  Review of Systems     Cardiac Risk Factors include: advanced age (>58men, >36 women);family history of premature cardiovascular disease     Objective:    There were no vitals filed for this visit. There is no height or weight on file to calculate BMI.  Advanced Directives 09/18/2019 09/03/2019 08/19/2019  Does Patient Have a Medical Advance Directive? - Yes Yes  Type of Paramedic of [place];Living will [place];Living will [place];Living will  Does patient want to make changes to medical advance directive? No - Patient declined No - Patient declined No - Patient declined  Copy of [place] in Chart? No - copy requested Yes - validated most recent copy scanned in chart (See row information) Yes - validated most recent copy scanned in chart (See row information)    Current Medications (verified) Outpatient Encounter Medications as of 09/18/2019  Medication Sig  . b complex vitamins tablet Take 1 tablet by mouth daily.  . Calcium Carb-Cholecalciferol (CALCIUM 500+D3 PO) Take 1 capsule by mouth daily. 200 units of vit D  . Cholecalciferol (VITAMIN D3) 25 MCG (1000 UT) CAPS Take 1 capsule by mouth daily.  . Cyanocobalamin (B-12) 5000 MCG CAPS Take 1 capsule by mouth daily.  Marland Kitchen levothyroxine (SYNTHROID) 75 MCG tablet Take 75 mcg by mouth daily before breakfast.  . Magnesium 300 MG CAPS Take 1 capsule by mouth daily.  . Multiple Vitamins-Minerals (ICAPS) TABS Take 1 tablet by mouth daily.  . Omega-3 Fatty Acids (OMEGA-3 PO) Take 1 tablet by mouth daily.  Marland Kitchen UNABLE TO FIND Take 1 tablet by mouth daily. Med Name: Trace Minerals  . UNABLE TO FIND Take 1 tablet by mouth daily. Med Name: CDP (memory) 300 mg  . [DISCONTINUED] Cholecalciferol (VITAMIN D-3) 125 MCG (5000 UT) TABS Take 1 tablet by  mouth daily. (Patient not taking: Reported on 09/18/2019)  . [DISCONTINUED] LITHIUM PO Take 1 capsule by mouth daily.  . [DISCONTINUED] Magnesium 250 MG TABS Take 1 tablet by mouth daily.   No facility-administered encounter medications on file as of 09/18/2019.    Allergies (verified) Patient has no known allergies.   History: Past Medical History:  Diagnosis Date  . Hypothyroidism    Hashimoto's per [facility] New Patient Packet   . Neuropathy   . Osteoarthritis    Per patient at New patient appointment   . Seasonal allergies   . Vitamin D deficiency    Past Surgical History:  Procedure Laterality Date  . No previous surgery     Family History  Problem Relation Age of Onset  . Heart disease Mother   . Heart failure Mother   . Heart disease Father   . Prostate cancer Father   . Heart disease Daughter   . Diabetes Daughter   . Neurologic Disorder Daughter    Social History   Socioeconomic History  . Marital status: Widowed    Spouse name: Not on file  . Number of children: Not on file  . Years of education: Not on file  . Highest education level: Not on file  Occupational History  . Not on file  Tobacco Use  . Smoking status: Never Smoker  . Smokeless tobacco: Never Used  Vaping Use  . Vaping Use: Never used  Substance and Sexual Activity  . Alcohol use: Yes  Comment: Occasional, last drink 1-2 years ago as of 2021   . Drug use: Never  . Sexual activity: Not on file  Other Topics Concern  . Not on file  Social History Narrative   Per [facility] New Patient Packet Abstracted on 08/19/2019:      Diet: Vegetarian, gluten free       Caffeine: Coffee & Tea      Married, if yes what year: Widow, 1959      Do you live in a house, apartment, assisted living, condo, trailer, ect: House      Is it one or more stories: One stories, one person       Pets: 4 cats       Current/Past profession: Left Blank      Highest level or education completed: 12 th grade        Exercise:        Try          Type and how often: Need to clarify, unable to read what patient wrote          Living Will: Yes   DNR: Yes   POA/HPOA: Yes      Functional Status:   Do you have difficulty bathing or dressing yourself? No   Do you have difficulty preparing food or eating?No   Do you have difficulty managing your medications? No   Do you have difficulty managing your finances? No   Do you have difficulty affording your medications? No   Social Determinants of Health   Financial Resource Strain:   . Difficulty of Paying Living Expenses:   Food Insecurity:   . Worried About Charity fundraiser in the Last Year:   . Arboriculturist in the Last Year:   Transportation Needs:   . Film/video editor (Medical):   Marland Kitchen Lack of Transportation (Non-Medical):   Physical Activity:   . Days of Exercise per Week:   . Minutes of Exercise per Session:   Stress:   . Feeling of Stress :   Social Connections:   . Frequency of Communication with Friends and Family:   . Frequency of Social Gatherings with Friends and Family:   . Attends Religious Services:   . Active Member of Clubs or Organizations:   . Attends Archivist Meetings:   Marland Kitchen Marital Status:     Tobacco Counseling Counseling given: Not Answered   Clinical Intake:  Pre-visit preparation completed: Yes  Pain : No/denies pain     BMI - recorded: 19 Nutritional Status: BMI of 19-24  Normal Diabetes: No  How often do you need to have someone help you when you read instructions, pamphlets, or other written materials from your doctor or pharmacy?: 1 - Never  Diabetic?yes         Activities of Daily Living In your present state of health, do you have any difficulty performing the following activities: 09/18/2019  Hearing? N  Vision? N  Difficulty concentrating or making decisions? N  Walking or climbing stairs? Y  Dressing or bathing? N  Doing errands, shopping? N  Preparing Food and eating ?  N  Using the Toilet? N  In the past six months, have you accidently leaked urine? Y  Do you have problems with loss of bowel control? N  Managing your Medications? N  Managing your Finances? N  Housekeeping or managing your Housekeeping? N  Some recent data might be hidden    Patient Care Team:  [name], NP as PCP - General (Geriatric Medicine) [name], MD as PCP - Cardiology (Cardiology) [name], MD as Referring Physician (Dermatology) [name], DO (Optometry) [name], MD as Consulting Physician (Otolaryngology)  Indicate any recent Medical Services you may have received from other than [facility] providers in the past year (date may be approximate).     Assessment:   This is a routine wellness examination for [place].  Hearing/Vision screen  Hearing Screening   125Hz  250Hz  500Hz  1000Hz  2000Hz  3000Hz  4000Hz  6000Hz  8000Hz   Right ear:           Left ear:           Comments: Excellent hearing, no hearing issues  Vision Screening Comments: Last eye exam summer 2021   Dietary issues and exercise activities discussed: Current Exercise Habits: Home exercise routine, Type of exercise: calisthenics, Time (Minutes): 20, Frequency (Times/Week): 5, Weekly Exercise (Minutes/Week): 100  Goals    . Patient Stated     Trying to eat more and get adequate protein.       Depression Screen PHQ 2/9 Scores 09/18/2019 08/29/2019 08/19/2019  PHQ - 2 Score 0 0 0    Fall Risk Fall Risk  09/18/2019 08/29/2019 08/27/2019 08/19/2019  Falls in the past year? 1 0 0 0  Number falls in past yr: 0 0 0 0  Injury with Fall? 0 0 0 0  Risk for fall due to : - - Impaired balance/gait -  Follow up - - Falls prevention discussed -    Any stairs in or around the home? Yes  If so, are there any without handrails? No  Home free of loose throw rugs in walkways, pet beds, electrical cords, etc? Yes  Adequate lighting in your home to reduce risk of falls? Yes   ASSISTIVE DEVICES  UTILIZED TO PREVENT FALLS:  Life alert? No  Use of a cane, walker or w/c? No  Grab bars in the bathroom? No  Shower chair or bench in shower? No  Elevated toilet seat or a handicapped toilet? Yes   TIMED UP AND GO: na  Cognitive Function:     6CIT Screen 09/18/2019  What Year? 0 points  What month? 0 points  What time? 0 points  Count back from 20 0 points  Months in reverse 0 points  Repeat phrase 0 points  Total Score 0    Immunizations Immunization History  Administered Date(s) Administered  . Influenza-Unspecified 02/16/2018  . Moderna SARS-COVID-2 Vaccination 02/12/2019, 03/15/2019  . Pneumococcal-Unspecified 12/14/2016    TDAP status: Due, Education has been provided regarding the importance of this vaccine. Advised may receive this vaccine at local pharmacy or Health Dept. Aware to provide a copy of the vaccination record if obtained from local pharmacy or Health Dept. Verbalized acceptance and understanding. Flu Vaccine status: Up to date Pneumonia vaccines up to date Covid-19 vaccine status: Completed vaccines  Qualifies for Shingles Vaccine? no  Zostavax completed No     Screening Tests Health Maintenance  Topic Date Due  . TETANUS/TDAP  Never done  . PNA vac Low Risk Adult (2 of 2 - PCV13) 12/14/2017  . INFLUENZA VACCINE  09/01/2019  . DEXA SCAN  Completed  . COVID-19 Vaccine  Completed    Health Maintenance  Health Maintenance Due  Topic Date Due  . TETANUS/TDAP  Never done  . PNA vac Low Risk Adult (2 of 2 - PCV13) 12/14/2017  . INFLUENZA VACCINE  09/01/2019    Colorectal  cancer screening: No longer required.  Mammogram status: No longer required.  dexa scan up to date  Lung Cancer Screening: (Low Dose CT Chest recommended if Age 63-80 years, 30 pack-year currently smoking OR have quit w/in 15years.) does not qualify.   Lung Cancer Screening Referral: na  Additional Screening:  Hepatitis C Screening: does not qualify; Completed  na  Vision Screening: Recommended annual ophthalmology exams for early detection of glaucoma and other disorders of the eye. Is the patient up to date with their annual eye exam?  Yes  Who is the provider or what is the name of the office in which the patient attends annual eye exams? Dr Christell Constant If pt is not established with a provider, would they like to be referred to a provider to establish care? No .   Dental Screening: Recommended annual dental exams for proper oral hygiene  Community Resource Referral / Chronic Care Management: CRR required this visit?  No   CCM required this visit?  No      Plan:     I have personally reviewed and noted the following in the patient's chart:   . Medical and social history . Use of alcohol, tobacco or illicit drugs  . Current medications and supplements . Functional ability and status . Nutritional status . Physical activity . Advanced directives . List of other physicians . Hospitalizations, surgeries, and ER visits in previous 12 months . Vitals . Screenings to include cognitive, depression, and falls . Referrals and appointments  In addition, I have reviewed and discussed with patient certain preventive protocols, quality metrics, and best practice recommendations. A written personalized care plan for preventive services as well as general preventive health recommendations were provided to patient.     [name], NP   09/18/2019

## 2019-09-18 NOTE — Telephone Encounter (Signed)
New message     Patient would like to try a medication that will help with the fluttering in her chest. She seen Dr Domenic Polite on 05/2019 and he told her he could get her something that would hellp

## 2019-09-18 NOTE — Telephone Encounter (Signed)
Please see my comments on cardiac monitor from April at which point low-dose beta-blocker was suggested depending on her symptoms.  She had occasional PVCs and also brief episodes of SVT.  We can start Toprol-XL at 12.5 mg daily and uptitrate as needed.

## 2019-09-18 NOTE — Patient Instructions (Signed)
Nancy Williams , Thank you for taking time to come for your Medicare Wellness Visit. I appreciate your ongoing commitment to your health goals. Please review the following plan we discussed and let me know if I can assist you in the future.   Screening recommendations/referrals: Colonoscopy aged out Mammogram aged out Bone Density up to date Recommended yearly ophthalmology/optometry visit for glaucoma screening and checkup Recommended yearly dental visit for hygiene and checkup  Vaccinations: Influenza vaccine DUE at this time, can get in office or at your local pharmacy Pneumococcal vaccine we will check the dates of this Tdap vaccine RECOMMENDED_ to get at your local pharmacy  Shingles vaccine na    Advanced directives: to bring copy into office.  Conditions/risks identified: falls due to balance issues  Next appointment: 1 year   Preventive Care 80 Years and Older, Female Preventive care refers to lifestyle choices and visits with your health care provider that can promote health and wellness. What does preventive care include?  A yearly physical exam. This is also called an annual well check.  Dental exams once or twice a year.  Routine eye exams. Ask your health care provider how often you should have your eyes checked.  Personal lifestyle choices, including:  Daily care of your teeth and gums.  Regular physical activity.  Eating a healthy diet.  Avoiding tobacco and drug use.  Limiting alcohol use.  Practicing safe sex.  Taking low-dose aspirin every day.  Taking vitamin and mineral supplements as recommended by your health care provider. What happens during an annual well check? The services and screenings done by your health care provider during your annual well check will depend on your age, overall health, lifestyle risk factors, and family history of disease. Counseling  Your health care provider may ask you questions about your:  Alcohol use.  Tobacco  use.  Drug use.  Emotional well-being.  Home and relationship well-being.  Sexual activity.  Eating habits.  History of falls.  Memory and ability to understand (cognition).  Work and work Statistician.  Reproductive health. Screening  You may have the following tests or measurements:  Height, weight, and BMI.  Blood pressure.  Lipid and cholesterol levels. These may be checked every 5 years, or more frequently if you are over 80 years old.  Skin check.  Lung cancer screening. You may have this screening every year starting at age 80 if you have a 30-pack-year history of smoking and currently smoke or have quit within the past 15 years.  Fecal occult blood test (FOBT) of the stool. You may have this test every year starting at age 80.  Flexible sigmoidoscopy or colonoscopy. You may have a sigmoidoscopy every 5 years or a colonoscopy every 10 years starting at age 80.  Hepatitis C blood test.  Hepatitis B blood test.  Sexually transmitted disease (STD) testing.  Diabetes screening. This is done by checking your blood sugar (glucose) after you have not eaten for a while (fasting). You may have this done every 1-3 years.  Bone density scan. This is done to screen for osteoporosis. You may have this done starting at age 80.  Mammogram. This may be done every 1-2 years. Talk to your health care provider about how often you should have regular mammograms. Talk with your health care provider about your test results, treatment options, and if necessary, the need for more tests. Vaccines  Your health care provider may recommend certain vaccines, such as:  Influenza vaccine. This is  recommended every year.  Tetanus, diphtheria, and acellular pertussis (Tdap, Td) vaccine. You may need a Td booster every 10 years.  Zoster vaccine. You may need this after age 80.  Pneumococcal 13-valent conjugate (PCV13) vaccine. One dose is recommended after age 80.  Pneumococcal  polysaccharide (PPSV23) vaccine. One dose is recommended after age 80. Talk to your health care provider about which screenings and vaccines you need and how often you need them. This information is not intended to replace advice given to you by your health care provider. Make sure you discuss any questions you have with your health care provider. Document Released: 02/13/2015 Document Revised: 10/07/2015 Document Reviewed: 11/18/2014 Elsevier Interactive Patient Education  2017 [employer] Prevention in the Home Falls can cause injuries. They can happen to people of all ages. There are many things you can do to make your home safe and to help prevent falls. What can I do on the outside of my home?  Regularly fix the edges of walkways and driveways and fix any cracks.  Remove anything that might make you trip as you walk through a door, such as a raised step or threshold.  Trim any bushes or trees on the path to your home.  Use bright outdoor lighting.  Clear any walking paths of anything that might make someone trip, such as rocks or tools.  Regularly check to see if handrails are loose or broken. Make sure that both sides of any steps have handrails.  Any raised decks and porches should have guardrails on the edges.  Have any leaves, snow, or ice cleared regularly.  Use sand or salt on walking paths during winter.  Clean up any spills in your garage right away. This includes oil or grease spills. What can I do in the bathroom?  Use night lights.  Install grab bars by the toilet and in the tub and shower. Do not use towel bars as grab bars.  Use non-skid mats or decals in the tub or shower.  If you need to sit down in the shower, use a plastic, non-slip stool.  Keep the floor dry. Clean up any water that spills on the floor as soon as it happens.  Remove soap buildup in the tub or shower regularly.  Attach bath mats securely with double-sided non-slip rug  tape.  Do not have throw rugs and other things on the floor that can make you trip. What can I do in the bedroom?  Use night lights.  Make sure that you have a light by your bed that is easy to reach.  Do not use any sheets or blankets that are too big for your bed. They should not hang down onto the floor.  Have a firm chair that has side arms. You can use this for support while you get dressed.  Do not have throw rugs and other things on the floor that can make you trip. What can I do in the kitchen?  Clean up any spills right away.  Avoid walking on wet floors.  Keep items that you use a lot in easy-to-reach places.  If you need to reach something above you, use a strong step stool that has a grab bar.  Keep electrical cords out of the way.  Do not use floor polish or wax that makes floors slippery. If you must use wax, use non-skid floor wax.  Do not have throw rugs and other things on the floor that can make you trip.  What can I do with my stairs?  Do not leave any items on the stairs.  Make sure that there are handrails on both sides of the stairs and use them. Fix handrails that are broken or loose. Make sure that handrails are as long as the stairways.  Check any carpeting to make sure that it is firmly attached to the stairs. Fix any carpet that is loose or worn.  Avoid having throw rugs at the top or bottom of the stairs. If you do have throw rugs, attach them to the floor with carpet tape.  Make sure that you have a light switch at the top of the stairs and the bottom of the stairs. If you do not have them, ask someone to add them for you. What else can I do to help prevent falls?  Wear shoes that:  Do not have high heels.  Have rubber bottoms.  Are comfortable and fit you well.  Are closed at the toe. Do not wear sandals.  If you use a stepladder:  Make sure that it is fully opened. Do not climb a closed stepladder.  Make sure that both sides of the  stepladder are locked into place.  Ask someone to hold it for you, if possible.  Clearly mark and make sure that you can see:  Any grab bars or handrails.  First and last steps.  Where the edge of each step is.  Use tools that help you move around (mobility aids) if they are needed. These include:  Canes.  Walkers.  Scooters.  Crutches.  Turn on the lights when you go into a dark area. Replace any light bulbs as soon as they burn out.  Set up your furniture so you have a clear path. Avoid moving your furniture around.  If any of your floors are uneven, fix them.  If there are any pets around you, be aware of where they are.  Review your medicines with your doctor. Some medicines can make you feel dizzy. This can increase your chance of falling. Ask your doctor what other things that you can do to help prevent falls. This information is not intended to replace advice given to you by your health care provider. Make sure you discuss any questions you have with your health care provider. Document Released: 11/13/2008 Document Revised: 06/25/2015 Document Reviewed: 02/21/2014 Elsevier Interactive Patient Education  2017 [employer].

## 2019-09-18 NOTE — Progress Notes (Signed)
   This service is provided via telemedicine  No vital signs collected/recorded due to the encounter was a telemedicine visit.   Location of patient (ex: home, work):  Home  Patient consents to a telephone visit: Yes, see telephone encounter dated 09/18/2019 with annual consent   Location of the provider (ex: office, home):  [facility], Office   Name of any referring provider:  N/A  Names of all persons participating in the telemedicine service and their role in the encounter:  S.Chrae B/CMA, Sherrie Mustache, NP, and Patient   Time spent on call:  11 min with medical assistant

## 2019-09-20 ENCOUNTER — Other Ambulatory Visit: Payer: Self-pay

## 2019-09-20 ENCOUNTER — Encounter (HOSPITAL_COMMUNITY): Payer: Self-pay | Admitting: Physical Therapy

## 2019-09-20 ENCOUNTER — Ambulatory Visit (HOSPITAL_COMMUNITY): Payer: Medicare HMO | Admitting: Physical Therapy

## 2019-09-20 DIAGNOSIS — M6281 Muscle weakness (generalized): Secondary | ICD-10-CM | POA: Diagnosis not present

## 2019-09-20 DIAGNOSIS — R2681 Unsteadiness on feet: Secondary | ICD-10-CM

## 2019-09-20 NOTE — Therapy (Signed)
[place] [address] [place], [place], [address] Phone: [phone]   Fax:  [phone]  Physical Therapy Treatment  Patient Details  Name: Nancy Williams MRN: [id number] Date of Birth: 1940/08/24 Referring Provider (PT): Sherrie Mustache, NP   Encounter Date: 09/20/2019   PT End of Session - 09/20/19 0816    Visit Number 3    Number of Visits 4    Date for PT Re-Evaluation 10/01/19    Authorization Type [employer] Medicare (No auth, visits based on medical necessity)    Progress Note Due on Visit 4    PT Start Time 0815    PT Stop Time 0853    PT Time Calculation (min) 38 min    Activity Tolerance Patient tolerated treatment well    Behavior During Therapy [facility] for tasks assessed/performed           Past Medical History:  Diagnosis Date  . Hypothyroidism    Hashimoto's per [facility] New Patient Packet   . Neuropathy   . Osteoarthritis    Per patient at New patient appointment   . Seasonal allergies   . Vitamin D deficiency     Past Surgical History:  Procedure Laterality Date  . No previous surgery      There were no vitals filed for this visit.   Subjective Assessment - 09/20/19 0816    Subjective Patient reported that she has been feeling better.    Pertinent History Insidious onset of veering while ambulating    Diagnostic tests Cervical x-ray: Negative    Patient Stated Goals To get rid of this feeling whatever it is    Currently in Pain? No/denies                                  Balance Exercises - 09/20/19 0001      Balance Exercises: Standing   Standing Eyes Closed Narrow base of support (BOS);1 rep;20 secs    Tandem Stance Eyes open;2 reps;20 secs    Other Standing Exercises Comments Vector stance 5x5'' each. Tandem ambulation 15' x 3 RT. Retro tandem walking x 3 RT 15'. Walking with head turns up/down and LT/RT 50' x 2 RT. Sidestepping 15' x1 RT. Sidestepping with (4) 6-inch hurdles x 3  RT. Tandem with 1# bar bilateral UE flexion x 20 each LE forward.                PT Short Term Goals - 09/03/19 1528      PT SHORT TERM GOAL #1   Title Patient will report understanding with HEP and report regular compliance to improve strength, balance and overall functional mobility.    Time 2    Period Weeks    Status New    Target Date 09/17/19             PT Long Term Goals - 09/03/19 1530      PT LONG TERM GOAL #1   Title Patient will demonstrate ability to maintain SLS on bilateral LEs for at least 10 seconds indicating improved stability and balance.    Time 4    Period Weeks    Status New    Target Date 10/01/19      PT LONG TERM GOAL #2   Title Patient will report an improvement in overall subjective complaint of at least 50% for improved QoL.    Time 4  Period Weeks    Status New    Target Date 10/01/19      PT LONG TERM GOAL #3   Title Patient will demonstrate ability to ambulate on 2MWT without reports of veering or having to reach for surrounding walls.    Time 4    Period Weeks    Status New    Target Date 10/01/19                 Plan - 09/20/19 0901    Clinical Impression Statement Progressed with dynamic balance challenges this session. Patient demonstrated good balance with tandem walking with minimal stepping outside the line. Patient demonstrated some unsteadiness with SLS with vectors and added this to patient's HEP at the counter top. Patient also demonstrated some unsteadiness with ambulation with head turns up and down, however patient was able to maintain balance, just slightly unsteady. Plan to progress patient with compliant surfaces for increased balance challenge next session.    Personal Factors and Comorbidities Age;Time since onset of injury/illness/exacerbation;Comorbidity 1    Comorbidities Hypothyroidism    Examination-Activity Limitations Locomotion Level    Examination-Participation Restrictions Community  Activity;Yard Work    Stability/Clinical Decision Making Stable/Uncomplicated    Rehab Potential Fair    PT Frequency 1x / week    PT Duration 4 weeks    PT Treatment/Interventions ADLs/Self Care Home Management;Aquatic Therapy;Canalith Repostioning;Cryotherapy;Electrical Stimulation;Biofeedback;Moist Heat;Traction;Ultrasound;DME Instruction;Gait training;Stair training;Functional mobility training;Therapeutic activities;Therapeutic exercise;Balance training;Neuromuscular re-education;Patient/family education;Manual techniques;Passive range of motion;Dry needling;Energy conservation;Taping    PT Next Visit Plan Progress exercises to compliant surfaces PRN. Add functional strengthening exercises.    PT Home Exercise Plan 09/10/19: SLS at counter, tandem stance at counter; 09/20/19: Vector stance at countertop 5x5'' each LE    Consulted and Agree with Plan of Care Patient           Patient will benefit from skilled therapeutic intervention in order to improve the following deficits and impairments:  Abnormal gait, Decreased activity tolerance, Decreased endurance, Decreased strength, Decreased balance, Difficulty walking  Visit Diagnosis: Unsteadiness on feet  Muscle weakness (generalized)     Problem List Patient Active Problem List   Diagnosis Date Noted  . Hyperlipemia 09/05/2019  . Hypothyroidism   . Muscle weakness (generalized) 02/21/2012  . Pain in joint, shoulder region 02/16/2012   [name] PT, DPT 9:03 AM, 09/20/19 [place], [place], [address] Phone: [phone]   Fax:  [phone]  Name: Nancy Williams MRN: [id number] Date of Birth: 15-Oct-1940

## 2019-09-25 ENCOUNTER — Ambulatory Visit (HOSPITAL_COMMUNITY): Payer: Medicare HMO | Admitting: Physical Therapy

## 2019-09-25 ENCOUNTER — Other Ambulatory Visit: Payer: Self-pay

## 2019-09-25 ENCOUNTER — Encounter (HOSPITAL_COMMUNITY): Payer: Self-pay | Admitting: Physical Therapy

## 2019-09-25 DIAGNOSIS — R2681 Unsteadiness on feet: Secondary | ICD-10-CM | POA: Diagnosis not present

## 2019-09-25 DIAGNOSIS — M6281 Muscle weakness (generalized): Secondary | ICD-10-CM | POA: Diagnosis not present

## 2019-09-25 NOTE — Therapy (Signed)
[place] [address] [place], [place], [address] Phone: [phone]   Fax:  [phone]  Physical Therapy Treatment / Discharge Summary  Patient Details  Name: Nancy Williams MRN: [id number] Date of Birth: Apr 10, 1940 Referring Provider (PT): Sherrie Mustache, NP   Encounter Date: 09/25/2019   PHYSICAL THERAPY DISCHARGE SUMMARY  Visits from Start of Care: 4  Current functional level related to goals / functional outcomes: See below   Remaining deficits: See below   Education / Equipment: Updated HEP and following up with MD as needed Plan: Patient agrees to discharge.  Patient goals were met. Patient is being discharged due to meeting the stated rehab goals.  ?????        PT End of Session - 09/25/19 0852    Visit Number 4    Number of Visits 4    Date for PT Re-Evaluation 10/01/19    Authorization Type [employer] Medicare (No auth, visits based on medical necessity)    Progress Note Due on Visit 4    PT Start Time 0818    PT Stop Time 0847    PT Time Calculation (min) 29 min    Activity Tolerance Patient tolerated treatment well    Behavior During Therapy [facility] for tasks assessed/performed           Past Medical History:  Diagnosis Date  . Hypothyroidism    Hashimoto's per [facility] New Patient Packet   . Neuropathy   . Osteoarthritis    Per patient at New patient appointment   . Seasonal allergies   . Vitamin D deficiency     Past Surgical History:  Procedure Laterality Date  . No previous surgery      There were no vitals filed for this visit.   Subjective Assessment - 09/25/19 0820    Subjective Patient reports 75% improvement in overall symptoms since beginning therapy. She reported that she is ready to be done with therapy.    Pertinent History Insidious onset of veering while ambulating    Diagnostic tests Cervical x-ray: Negative    Patient Stated Goals To get rid of this feeling whatever it is    Currently  in Pain? No/denies              [facility] PT Assessment - 09/25/19 0001      Assessment   Medical Diagnosis Imbalance    Referring Provider (PT) Sherrie Mustache, NP      Strength   Right Hip Flexion 4+/5    Right Hip Extension 4+/5    Right Hip ABduction 5/5    Left Hip Flexion 4+/5    Left Hip Extension 4+/5    Left Hip ABduction 5/5    Right Knee Flexion 5/5    Right Knee Extension 5/5    Left Knee Flexion 5/5    Left Knee Extension 5/5    Right Ankle Dorsiflexion 5/5    Left Ankle Dorsiflexion 5/5      Ambulation/Gait   Ambulation/Gait Yes    Ambulation Distance (Feet) 396 Feet   2MWT   Assistive device None    Gait Comments Patient without reported veering and no reaching for the wall      Static Standing Balance   Static Standing - Balance Support No upper extremity supported    Static Standing Balance -  Activities  Single Leg Stance - Right Leg;Single Leg Stance - Left Leg    Static Standing - Comment/# of Minutes LT 19  seconds; RT 15 seconds                              Balance Exercises - 09/25/19 0001      Balance Exercises: Standing   Tandem Stance Eyes open;2 reps;20 secs;Foam/compliant surface    Other Standing Exercises Comments Vector stance 2x5'' each on foam             PT Education - 09/25/19 0850    Education Details Discussed re-assessment findings, and updates to HEP.    Person(s) Educated Patient    Methods Explanation    Comprehension Verbalized understanding            PT Short Term Goals - 09/25/19 2122      PT SHORT TERM GOAL #1   Title Patient will report understanding with HEP and report regular compliance to improve strength, balance and overall functional mobility.    Time 2    Period Weeks    Status Achieved    Target Date 09/17/19             PT Long Term Goals - 09/25/19 4825      PT LONG TERM GOAL #1   Title Patient will demonstrate ability to maintain SLS on bilateral LEs for at least 10  seconds indicating improved stability and balance.    Time 4    Period Weeks    Status Achieved      PT LONG TERM GOAL #2   Title Patient will report an improvement in overall subjective complaint of at least 50% for improved QoL.    Baseline 09/25/19: patient reported 75% improvement    Time 4    Period Weeks    Status Achieved      PT LONG TERM GOAL #3   Title Patient will demonstrate ability to ambulate on 2MWT without reports of veering or having to reach for surrounding walls.    Baseline 09/25/19: Patient reported feeling good without reported complaints in veering    Time 4    Period Weeks    Status Achieved                 Plan - 09/25/19 0855    Clinical Impression Statement Performed a re-assessment of patient's progress towards goals. Patient achieved 1 out of 1 short term goals and 3 out of 3 long term goals. Patient has reported a significant improvement since beginning therapy of 75%. She states that she is ready to be discharged and continue at home with HEP. Educated patient on increasing the challenge of balance activities with compliant surfaces such as standing on a pillow or checking the local gym for foam to continue working on static SLS or tandem balance exercises. At this time plan to discharge patient and explained to patient that she should follow-up with her MD should her symptoms worsen.    Personal Factors and Comorbidities Age;Time since onset of injury/illness/exacerbation;Comorbidity 1    Comorbidities Hypothyroidism    Examination-Activity Limitations Locomotion Level    Examination-Participation Restrictions Community Activity;Yard Work    Stability/Clinical Decision Making Stable/Uncomplicated    Rehab Potential Fair    PT Frequency 1x / week    PT Duration 4 weeks    PT Treatment/Interventions ADLs/Self Care Home Management;Aquatic Therapy;Canalith Repostioning;Cryotherapy;Electrical Stimulation;Biofeedback;Moist Heat;Traction;Ultrasound;DME  Instruction;Gait training;Stair training;Functional mobility training;Therapeutic activities;Therapeutic exercise;Balance training;Neuromuscular re-education;Patient/family education;Manual techniques;Passive range of motion;Dry needling;Energy conservation;Taping    PT Next Visit Plan Discharged  PT Home Exercise Plan 09/10/19: SLS at counter, tandem stance at counter; 09/20/19: Vector stance at countertop 5x5'' each LE; 09/25/19: Add compliant surface to static balance exercises    Consulted and Agree with Plan of Care Patient           Patient will benefit from skilled therapeutic intervention in order to improve the following deficits and impairments:  Abnormal gait, Decreased activity tolerance, Decreased endurance, Decreased strength, Decreased balance, Difficulty walking  Visit Diagnosis: Unsteadiness on feet  Muscle weakness (generalized)     Problem List Patient Active Problem List   Diagnosis Date Noted  . Hyperlipemia 09/05/2019  . Hypothyroidism   . Muscle weakness (generalized) 02/21/2012  . Pain in joint, shoulder region 02/16/2012   [name] PT, DPT 8:59 AM, 09/25/19 [place], [place], [address] Phone: [phone]   Fax:  [phone]  Name: KINYA MEINE MRN: [id number] Date of Birth: [birth year]

## 2019-09-30 ENCOUNTER — Ambulatory Visit (HOSPITAL_COMMUNITY): Payer: Medicare HMO | Admitting: Physical Therapy

## 2019-10-02 ENCOUNTER — Encounter (HOSPITAL_COMMUNITY): Payer: Self-pay | Admitting: Physical Therapy

## 2019-10-24 DIAGNOSIS — H6123 Impacted cerumen, bilateral: Secondary | ICD-10-CM | POA: Diagnosis not present

## 2019-11-20 ENCOUNTER — Ambulatory Visit (INDEPENDENT_AMBULATORY_CARE_PROVIDER_SITE_OTHER): Payer: Medicare HMO | Admitting: Nurse Practitioner

## 2019-11-20 ENCOUNTER — Encounter: Payer: Self-pay | Admitting: Nurse Practitioner

## 2019-11-20 ENCOUNTER — Other Ambulatory Visit: Payer: Self-pay

## 2019-11-20 VITALS — BP 118/70 | HR 69 | Temp 97.3°F | Ht 65.0 in | Wt 122.0 lb

## 2019-11-20 DIAGNOSIS — Z23 Encounter for immunization: Secondary | ICD-10-CM | POA: Diagnosis not present

## 2019-11-20 DIAGNOSIS — M8580 Other specified disorders of bone density and structure, unspecified site: Secondary | ICD-10-CM | POA: Diagnosis not present

## 2019-11-20 DIAGNOSIS — E063 Autoimmune thyroiditis: Secondary | ICD-10-CM

## 2019-11-20 DIAGNOSIS — R2689 Other abnormalities of gait and mobility: Secondary | ICD-10-CM | POA: Diagnosis not present

## 2019-11-20 DIAGNOSIS — R002 Palpitations: Secondary | ICD-10-CM

## 2019-11-20 DIAGNOSIS — E038 Other specified hypothyroidism: Secondary | ICD-10-CM | POA: Diagnosis not present

## 2019-11-20 DIAGNOSIS — R636 Underweight: Secondary | ICD-10-CM | POA: Diagnosis not present

## 2019-11-20 DIAGNOSIS — Z789 Other specified health status: Secondary | ICD-10-CM

## 2019-11-20 NOTE — Progress Notes (Signed)
Careteam: Patient Care Team: [name], NP as PCP - General (Geriatric Medicine) [name], MD as PCP - Cardiology (Cardiology) [name], MD as Referring Physician (Dermatology) [name], DO (Optometry) [name], MD as Consulting Physician (Otolaryngology)  PLACE OF SERVICE:  [place] Directive information Does Patient Have a Medical Advance Directive?: Yes, Type of Advance Directive: [place];Living will, Does patient want to make changes to medical advance directive?: No - Patient declined  No Known Allergies  Chief Complaint  Patient presents with  . Medical Management of Chronic Issues    3 month follow-up. Flu vaccine today. Discuss need for PNA and TD. Here with daughter Magda Paganini.     HPI: Patient is a 79 y.o. female for routine follow up Pt established care in July.   She is fairly certain she had both (2) pneumonia vaccine.   Imbalance issues- went to PT and had 2 sessions and 2 sessions and feels like this has significantly improved, overall better.   Underweight- has gained 2 lbs, went to see a nutritionist and felt like that was beneficial.   Hypothyroid- continues on synthroid 75 mcg. She was taking off amour thyroid   Palpitations- stable on metoprolol   Review of Systems:  Review of Systems  Constitutional: Negative for chills, fever and weight loss.  HENT: Negative for tinnitus.   Respiratory: Negative for cough, sputum production and shortness of breath.   Cardiovascular: Negative for chest pain, palpitations and leg swelling.  Gastrointestinal: Negative for abdominal pain, constipation, diarrhea and heartburn.  Genitourinary: Negative for dysuria, frequency and urgency.  Musculoskeletal: Negative for back pain, falls, joint pain and myalgias.  Skin: Negative.   Neurological: Negative for dizziness and headaches.  Psychiatric/Behavioral: Negative for depression and memory loss. The patient  does not have insomnia.     Past Medical History:  Diagnosis Date  . Hypothyroidism    Hashimoto's per [facility] New Patient Packet   . Neuropathy   . Osteoarthritis    Per patient at New patient appointment   . Seasonal allergies   . Vitamin D deficiency    Past Surgical History:  Procedure Laterality Date  . No previous surgery     Social History:   reports that she has never smoked. She has never used smokeless tobacco. She reports current alcohol use. She reports that she does not use drugs.  Family History  Problem Relation Age of Onset  . Heart disease Mother   . Heart failure Mother   . Heart disease Father   . Prostate cancer Father   . Heart disease Daughter   . Diabetes Daughter   . Neurologic Disorder Daughter     Medications: Patient's Medications  New Prescriptions   No medications on file  Previous Medications   B COMPLEX VITAMINS TABLET    Take 1 tablet by mouth daily.   CALCIUM CARB-CHOLECALCIFEROL (CALCIUM 500+D3 PO)    Take 1 capsule by mouth daily. 200 units of vit D   CHOLECALCIFEROL (VITAMIN D3) 25 MCG (1000 UT) CAPS    Take 1 capsule by mouth daily.   CHOLECALCIFEROL (VITAMIN D3) 50 MCG (2000 UT) TABS    Take 1 tablet by mouth daily.   CYANOCOBALAMIN (B-12) 5000 MCG CAPS    Take 1 capsule by mouth daily.   FLUTICASONE (FLONASE) 50 MCG/ACT NASAL SPRAY    Place 1 spray into both nostrils as needed for allergies or rhinitis.   LEVOTHYROXINE (SYNTHROID) 75  MCG TABLET    Take 75 mcg by mouth daily before breakfast.   MAGNESIUM 300 MG CAPS    Take 1 capsule by mouth daily.   METOPROLOL SUCCINATE (TOPROL XL) 25 MG 24 HR TABLET    Take 0.5 tablets (12.5 mg total) by mouth daily.   MULTIPLE VITAMINS-MINERALS (ICAPS) TABS    Take 1 tablet by mouth daily.   OMEGA-3 FATTY ACIDS (OMEGA-3 PO)    Take 1 tablet by mouth daily.   UNABLE TO FIND    Take 1 tablet by mouth daily. Med Name: CDP (memory) 300 mg  Modified Medications   No medications on file  Discontinued  Medications   UNABLE TO FIND    Take 1 tablet by mouth daily. Med Name: Trace Minerals    Physical Exam:  Vitals:   11/20/19 1310  BP: 118/70  Pulse: 69  Temp: (!) 97.3 F (36.3 C)  TempSrc: Temporal  SpO2: 97%  Weight: 122 lb (55.3 kg)  Height: 5\' 5"  (1.651 m)   Body mass index is 20.3 kg/m. Wt Readings from Last 3 Encounters:  11/20/19 122 lb (55.3 kg)  08/28/19 120 lb (54.4 kg)  08/27/19 121 lb 9.6 oz (55.2 kg)    Physical Exam Constitutional:      General: She is not in acute distress.    Appearance: She is well-developed. She is not diaphoretic.  HENT:     Head: Normocephalic and atraumatic.     Mouth/Throat:     Pharynx: No oropharyngeal exudate.  Eyes:     Conjunctiva/sclera: Conjunctivae normal.     Pupils: Pupils are equal, round, and reactive to light.  Cardiovascular:     Rate and Rhythm: Normal rate and regular rhythm.     Heart sounds: Normal heart sounds.  Pulmonary:     Effort: Pulmonary effort is normal.     Breath sounds: Normal breath sounds.  Abdominal:     General: Bowel sounds are normal.     Palpations: Abdomen is soft.  Musculoskeletal:        General: No tenderness.     Cervical back: Normal range of motion and neck supple.  Skin:    General: Skin is warm and dry.  Neurological:     Mental Status: She is alert and oriented to person, place, and time.    Labs reviewed: Basic Metabolic Panel: Recent Labs    02/05/19 0000 04/05/19 0000 05/31/19 0000  NA  --   --  139  K  --   --  5.0  CL  --   --  103  CO2  --   --  24*  BUN  --   --  17  CREATININE  --   --  0.7  CALCIUM  --   --  9.6  TSH 3.19 4.40 1.65   Liver Function Tests: Recent Labs    05/31/19 0000  AST 26  ALT 21  ALKPHOS 82  ALBUMIN 4.4   No results for input(s): LIPASE, AMYLASE in the last 8760 hours. No results for input(s): AMMONIA in the last 8760 hours. CBC: Recent Labs    05/31/19 0000  WBC 7.4  HGB 13.4  HCT 41  PLT 237   Lipid  Panel: Recent Labs    05/31/19 0000  CHOL 230*  HDL 89*  LDLCALC 122  TRIG 110   TSH: Recent Labs    02/05/19 0000 04/05/19 0000 05/31/19 0000  TSH 3.19 4.40 1.65   A1C: Lab  Results  Component Value Date   HGBA1C 5.4 05/31/2019     Assessment/Plan 1. Need for influenza vaccination - Flu Vaccine QUAD High Dose(Fluad)  2. Hypothyroidism due to Hashimoto's thyroiditis Continues on synthroid 75 mcg  3.  Underweight Stable, she is vegan and limited with food choices, using supplements and eating more frequently which has been benefitical. Has gained weight.   5. Imbalance Has improved with PT.  6. Palpitations Stable on metoprolol.   7. Osteopenia, unspecified location Continue cal and vit d, weight bearing exercises recommended.   Next appt: 6 months. Labs at appt.  Carlos American. [place], [facility] [phone]

## 2019-12-10 ENCOUNTER — Other Ambulatory Visit: Payer: Self-pay | Admitting: *Deleted

## 2019-12-10 MED ORDER — LEVOTHYROXINE SODIUM 75 MCG PO TABS
ORAL_TABLET | ORAL | 1 refills | Status: DC
Start: 2019-12-10 — End: 2020-06-25

## 2019-12-10 NOTE — Telephone Encounter (Signed)
[facility]

## 2019-12-16 DIAGNOSIS — H524 Presbyopia: Secondary | ICD-10-CM | POA: Diagnosis not present

## 2019-12-16 DIAGNOSIS — H52223 Regular astigmatism, bilateral: Secondary | ICD-10-CM | POA: Diagnosis not present

## 2019-12-16 DIAGNOSIS — H5203 Hypermetropia, bilateral: Secondary | ICD-10-CM | POA: Diagnosis not present

## 2019-12-17 DIAGNOSIS — Z0100 Encounter for examination of eyes and vision without abnormal findings: Secondary | ICD-10-CM | POA: Diagnosis not present

## 2020-01-07 DIAGNOSIS — R69 Illness, unspecified: Secondary | ICD-10-CM | POA: Diagnosis not present

## 2020-02-10 ENCOUNTER — Other Ambulatory Visit: Payer: Self-pay | Admitting: Cardiology

## 2020-05-20 ENCOUNTER — Other Ambulatory Visit: Payer: Self-pay

## 2020-05-20 ENCOUNTER — Encounter: Payer: Self-pay | Admitting: Nurse Practitioner

## 2020-05-20 ENCOUNTER — Ambulatory Visit (INDEPENDENT_AMBULATORY_CARE_PROVIDER_SITE_OTHER): Payer: Medicare HMO | Admitting: Nurse Practitioner

## 2020-05-20 ENCOUNTER — Telehealth: Payer: Self-pay

## 2020-05-20 ENCOUNTER — Other Ambulatory Visit: Payer: Medicare HMO

## 2020-05-20 VITALS — BP 128/80 | HR 68 | Temp 97.3°F | Ht 65.0 in | Wt 128.0 lb

## 2020-05-20 DIAGNOSIS — R002 Palpitations: Secondary | ICD-10-CM

## 2020-05-20 DIAGNOSIS — R636 Underweight: Secondary | ICD-10-CM

## 2020-05-20 DIAGNOSIS — M8580 Other specified disorders of bone density and structure, unspecified site: Secondary | ICD-10-CM

## 2020-05-20 DIAGNOSIS — G629 Polyneuropathy, unspecified: Secondary | ICD-10-CM

## 2020-05-20 DIAGNOSIS — Z789 Other specified health status: Secondary | ICD-10-CM

## 2020-05-20 DIAGNOSIS — E038 Other specified hypothyroidism: Secondary | ICD-10-CM

## 2020-05-20 DIAGNOSIS — E782 Mixed hyperlipidemia: Secondary | ICD-10-CM

## 2020-05-20 DIAGNOSIS — E063 Autoimmune thyroiditis: Secondary | ICD-10-CM | POA: Diagnosis not present

## 2020-05-20 MED ORDER — GABAPENTIN 100 MG PO CAPS: 100.0000 mg | ORAL_CAPSULE | Freq: Two times a day (BID) | ORAL | 1 refills | Status: DC | PRN

## 2020-05-20 NOTE — Progress Notes (Signed)
Careteam: Patient Care Team: [name], NP as PCP - General (Geriatric Medicine) [name], MD as PCP - Cardiology (Cardiology) [name], MD as Referring Physician (Dermatology) [name], DO (Optometry) [name], MD as Consulting Physician (Otolaryngology)  PLACE OF SERVICE:  [place] Directive information    No Known Allergies  Chief Complaint  Patient presents with  . Medical Management of Chronic Issues    6 month follow-up and discuss need for TD/tdap and PNA. Pateint would like to disucss cologuard and neuropathe      HPI: Patient is a 80 y.o. female for routine follow up  Has gained weight, now normal BMI!  Reports hands are tingling and numb in the morning sometimes last all day and sometimes goes away.  Also has neuropathy in feet- but they do not bother her Reports hx of neck pain due to OA had xray and showed mild to moderate multilevel degenerative changes.  Reports tingling is worse in hands the last 2-3 months.  No worsening neck pain.  Left hand worse than right.  Right shoulder pain.    Review of Systems:  Review of Systems  Constitutional: Negative for chills, fever and weight loss.  HENT: Negative for tinnitus.   Respiratory: Negative for cough, sputum production and shortness of breath.   Cardiovascular: Negative for chest pain, palpitations and leg swelling.  Gastrointestinal: Negative for abdominal pain, constipation, diarrhea and heartburn.  Genitourinary: Negative for dysuria, frequency and urgency.  Musculoskeletal: Positive for back pain and neck pain. Negative for falls, joint pain and myalgias.  Skin: Negative.   Neurological: Positive for tingling. Negative for dizziness and headaches.  Psychiatric/Behavioral: Negative for depression and memory loss. The patient does not have insomnia.     Past Medical History:  Diagnosis Date  . Hypothyroidism    Hashimoto's per [facility] New Patient Packet   .  Neuropathy   . Osteoarthritis    Per patient at New patient appointment   . Seasonal allergies   . Vitamin D deficiency    Past Surgical History:  Procedure Laterality Date  . No previous surgery     Social History:   reports that she has never smoked. She has never used smokeless tobacco. She reports current alcohol use. She reports that she does not use drugs.  Family History  Problem Relation Age of Onset  . Heart disease Mother   . Heart failure Mother   . Heart disease Father   . Prostate cancer Father   . Heart disease Daughter   . Diabetes Daughter   . Neurologic Disorder Daughter     Medications: Patient's Medications  New Prescriptions   No medications on file  Previous Medications   B COMPLEX VITAMINS TABLET    Take 1 tablet by mouth daily.   CALCIUM CARB-CHOLECALCIFEROL (CALCIUM 500+D3 PO)    Take 1 capsule by mouth daily. 200 units of vit D   CHOLECALCIFEROL (VITAMIN D3) 25 MCG (1000 UT) CAPS    Take 1 capsule by mouth daily.   CYANOCOBALAMIN (B-12) 5000 MCG CAPS    Take 1 capsule by mouth daily.   FLUTICASONE (FLONASE) 50 MCG/ACT NASAL SPRAY    Place 1 spray into both nostrils as needed for allergies or rhinitis.   LEVOTHYROXINE (SYNTHROID) 75 MCG TABLET    Take one tablet by mouth once daily 30 minutes before breakfast on empty stomach.   MAGNESIUM 300 MG CAPS    Take 1 capsule by mouth  daily.   METOPROLOL SUCCINATE (TOPROL-XL) 25 MG 24 HR TABLET    Take 1/2 (one-half) tablet by mouth once daily   MULTIPLE VITAMINS-MINERALS (ICAPS) TABS    Take 1 tablet by mouth daily.   OMEGA-3 FATTY ACIDS (OMEGA-3 PO)    Take 1 tablet by mouth daily.   UNABLE TO FIND    Take 1 tablet by mouth daily. Med Name: CDP (memory) 300 mg  Modified Medications   No medications on file  Discontinued Medications   CHOLECALCIFEROL (VITAMIN D3) 50 MCG (2000 UT) TABS    Take 1 tablet by mouth daily.    Physical Exam:  Vitals:   05/20/20 0901  BP: 128/80  Pulse: 68  Temp: (!) 97.3  F (36.3 C)  TempSrc: Temporal  SpO2: 98%  Weight: 128 lb (58.1 kg)  Height: 5\' 5"  (1.651 m)   Body mass index is 21.3 kg/m. Wt Readings from Last 3 Encounters:  05/20/20 128 lb (58.1 kg)  11/20/19 122 lb (55.3 kg)  08/28/19 120 lb (54.4 kg)    Physical Exam Constitutional:      General: She is not in acute distress.    Appearance: She is well-developed. She is not diaphoretic.  HENT:     Head: Normocephalic and atraumatic.     Mouth/Throat:     Pharynx: No oropharyngeal exudate.  Eyes:     Conjunctiva/sclera: Conjunctivae normal.     Pupils: Pupils are equal, round, and reactive to light.  Cardiovascular:     Rate and Rhythm: Normal rate and regular rhythm.     Heart sounds: Normal heart sounds.  Pulmonary:     Effort: Pulmonary effort is normal.     Breath sounds: Normal breath sounds.  Abdominal:     General: Bowel sounds are normal.     Palpations: Abdomen is soft.  Musculoskeletal:        General: No tenderness.     Cervical back: Normal range of motion and neck supple.  Skin:    General: Skin is warm and dry.  Neurological:     Mental Status: She is alert and oriented to person, place, and time.     Sensory: No sensory deficit.     Motor: No weakness.     Coordination: Coordination normal.     Gait: Gait normal.     Deep Tendon Reflexes: Reflexes normal.  Psychiatric:        Mood and Affect: Mood normal.        Behavior: Behavior normal.     Labs reviewed: Basic Metabolic Panel: Recent Labs    05/31/19 0000  NA 139  K 5.0  CL 103  CO2 24*  BUN 17  CREATININE 0.7  CALCIUM 9.6  TSH 1.65   Liver Function Tests: Recent Labs    05/31/19 0000  AST 26  ALT 21  ALKPHOS 82  ALBUMIN 4.4   No results for input(s): LIPASE, AMYLASE in the last 8760 hours. No results for input(s): AMMONIA in the last 8760 hours. CBC: Recent Labs    05/31/19 0000  WBC 7.4  HGB 13.4  HCT 41  PLT 237   Lipid Panel: Recent Labs    05/31/19 0000  CHOL 230*   HDL 89*  LDLCALC 122  TRIG 110   TSH: Recent Labs    05/31/19 0000  TSH 1.65   A1C: Lab Results  Component Value Date   HGBA1C 5.4 05/31/2019     Assessment/Plan 1. Underweight -positive weight gain.  -  TSH - CBC with Differential/Platelet - COMPLETE METABOLIC PANEL WITH GFR  2. Hypothyroidism due to Hashimoto's thyroiditis -continues on synthroid 75 mcg - TSH  3. Palpitations -stable, continues on metoprolol succinate which controls symptoms - CBC with Differential/Platelet - COMPLETE METABOLIC PANEL WITH GFR  4. Osteopenia, unspecified location -continue with cal and vit d with weight bearing exercises.  - CBC with Differential/Platelet - COMPLETE METABOLIC PANEL WITH GFR  5. Vegan -continues on supplements and ways to get nutrition - CBC with Differential/Platelet  6. Mixed hyperlipidemia -continues on dietary modifications.  - Lipid panel - COMPLETE METABOLIC PANEL WITH GFR  7. Neuropathy to upper extremities  -possibly coming from OA in neck, ?carpel tunnel.  No worsening neck pain.  -will start gabapentin 100 mg BID PRN pain - Nerve conduction test; Future  Next appt: 6 months, sooner if neeed Bela Nyborg K. [place], [facility] [phone]

## 2020-05-20 NOTE — Telephone Encounter (Signed)
Patient asked the front desk registrar if we can send rx for Gabapentin to local pharmacy versus mail order as she will be able to get it quicker versus waiting to get it  from the mail order pharmacy.   I called [facility] and spoke with Haskell Flirt, RX had not come through on their end, yet she noted the account to void rx once received.

## 2020-05-20 NOTE — Patient Instructions (Signed)
Order for nerve conduction study has been placed if you chose to have test  You can start gabapentin twice daily as needed for nerve pain.

## 2020-05-21 ENCOUNTER — Encounter: Payer: Self-pay | Admitting: Nurse Practitioner

## 2020-05-21 LAB — COMPLETE METABOLIC PANEL WITH GFR
AG Ratio: 1.6 (calc) (ref 1.0–2.5)
ALT: 14 U/L (ref 6–29)
AST: 23 U/L (ref 10–35)
Albumin: 4.4 g/dL (ref 3.6–5.1)
Alkaline phosphatase (APISO): 65 U/L (ref 37–153)
BUN: 17 mg/dL (ref 7–25)
CO2: 27 mmol/L (ref 20–32)
Calcium: 9.7 mg/dL (ref 8.6–10.4)
Chloride: 103 mmol/L (ref 98–110)
Creat: 0.8 mg/dL (ref 0.60–0.88)
GFR, Est African American: 80 mL/min/{1.73_m2} (ref >=60)
GFR, Est Non African American: 69 mL/min/{1.73_m2} (ref >=60)
Globulin: 2.7 g/dL (calc) (ref 1.9–3.7)
Glucose, Bld: 84 mg/dL (ref 65–99)
Potassium: 4.5 mmol/L (ref 3.5–5.3)
Sodium: 139 mmol/L (ref 135–146)
Total Bilirubin: 0.4 mg/dL (ref 0.2–1.2)
Total Protein: 7.1 g/dL (ref 6.1–8.1)

## 2020-05-21 LAB — LIPID PANEL
Cholesterol: 235 mg/dL — ABNORMAL HIGH (ref <200)
HDL: 96 mg/dL (ref >=50)
LDL Cholesterol (Calc): 120 mg/dL (calc) — ABNORMAL HIGH
Non-HDL Cholesterol (Calc): 139 mg/dL (calc) — ABNORMAL HIGH (ref <130)
Total CHOL/HDL Ratio: 2.4 (calc) (ref <5.0)
Triglycerides: 86 mg/dL (ref <150)

## 2020-05-21 LAB — CBC WITH DIFFERENTIAL/PLATELET
Absolute Monocytes: 957 cells/uL — ABNORMAL HIGH (ref 200–950)
Basophils Absolute: 44 cells/uL (ref 0–200)
Basophils Relative: 0.5 %
Eosinophils Absolute: 183 cells/uL (ref 15–500)
Eosinophils Relative: 2.1 %
HCT: 43.1 % (ref 35.0–45.0)
Hemoglobin: 14.2 g/dL (ref 11.7–15.5)
Lymphs Abs: 2410 cells/uL (ref 850–3900)
MCH: 30 pg (ref 27.0–33.0)
MCHC: 32.9 g/dL (ref 32.0–36.0)
MCV: 90.9 fL (ref 80.0–100.0)
MPV: 11 fL (ref 7.5–12.5)
Monocytes Relative: 11 %
Neutro Abs: 5107 cells/uL (ref 1500–7800)
Neutrophils Relative %: 58.7 %
Platelets: 269 10*3/uL (ref 140–400)
RBC: 4.74 10*6/uL (ref 3.80–5.10)
RDW: 12.4 % (ref 11.0–15.0)
Total Lymphocyte: 27.7 %
WBC: 8.7 10*3/uL (ref 3.8–10.8)

## 2020-05-21 LAB — TSH: TSH: 2.42 mIU/L (ref 0.40–4.50)

## 2020-05-21 NOTE — Telephone Encounter (Signed)
Communications reviewed.  Yes, she can take the full Toprol-XL 25 mg tablet with more of a sense of jitteriness and palpitations.  Ultimately, that would be a reasonable daily dose as well if she felt better.

## 2020-05-21 NOTE — Telephone Encounter (Signed)
Routed to Jessica Eubanks NP  

## 2020-05-25 ENCOUNTER — Encounter: Payer: Self-pay | Admitting: Nurse Practitioner

## 2020-06-04 DIAGNOSIS — Z1212 Encounter for screening for malignant neoplasm of rectum: Secondary | ICD-10-CM | POA: Diagnosis not present

## 2020-06-04 DIAGNOSIS — Z1211 Encounter for screening for malignant neoplasm of colon: Secondary | ICD-10-CM | POA: Diagnosis not present

## 2020-06-08 DIAGNOSIS — M9901 Segmental and somatic dysfunction of cervical region: Secondary | ICD-10-CM | POA: Diagnosis not present

## 2020-06-08 DIAGNOSIS — M5413 Radiculopathy, cervicothoracic region: Secondary | ICD-10-CM | POA: Diagnosis not present

## 2020-06-11 LAB — EXTERNAL GENERIC LAB PROCEDURE: COLOGUARD: NEGATIVE

## 2020-06-11 LAB — COLOGUARD
COLOGUARD: NEGATIVE
Cologuard: NEGATIVE

## 2020-06-12 ENCOUNTER — Telehealth: Payer: Self-pay

## 2020-06-12 NOTE — Telephone Encounter (Signed)
Patient was called and given results of cologuard. Results were negative and patient verbalized her understanding. Results were abstracted and sent for scanning.

## 2020-06-22 DIAGNOSIS — L821 Other seborrheic keratosis: Secondary | ICD-10-CM | POA: Diagnosis not present

## 2020-06-22 DIAGNOSIS — L719 Rosacea, unspecified: Secondary | ICD-10-CM | POA: Diagnosis not present

## 2020-06-22 DIAGNOSIS — D1801 Hemangioma of skin and subcutaneous tissue: Secondary | ICD-10-CM | POA: Diagnosis not present

## 2020-06-25 ENCOUNTER — Other Ambulatory Visit: Payer: Self-pay | Admitting: Nurse Practitioner

## 2020-07-09 DIAGNOSIS — H6123 Impacted cerumen, bilateral: Secondary | ICD-10-CM | POA: Diagnosis not present

## 2020-07-27 ENCOUNTER — Other Ambulatory Visit: Payer: Self-pay | Admitting: Nurse Practitioner

## 2020-07-27 ENCOUNTER — Encounter: Payer: Self-pay | Admitting: Neurology

## 2020-07-27 ENCOUNTER — Encounter: Payer: Self-pay | Admitting: Nurse Practitioner

## 2020-07-27 DIAGNOSIS — G629 Polyneuropathy, unspecified: Secondary | ICD-10-CM

## 2020-09-02 ENCOUNTER — Other Ambulatory Visit: Payer: Self-pay

## 2020-09-02 ENCOUNTER — Ambulatory Visit: Payer: Medicare HMO | Admitting: Neurology

## 2020-09-02 DIAGNOSIS — G629 Polyneuropathy, unspecified: Secondary | ICD-10-CM

## 2020-09-02 DIAGNOSIS — G5602 Carpal tunnel syndrome, left upper limb: Secondary | ICD-10-CM

## 2020-09-02 NOTE — Procedures (Signed)
[facility]  [address], [address]  [place], [place] [address] Tel: [phone] Fax:  [phone] Test Date:  09/02/2020  Patient: Nancy Williams DOB: [birth year] Physician: Narda Amber, DO  Sex: Female Height: '5\' 5"'$  Ref Phys: Sherrie Mustache, NP  ID#: [id number]   Technician:    Patient Complaints: This is a 80 year old female referred for evaluation of left hand paresthesias.  NCV & EMG Findings: Extensive electrodiagnostic testing of the left upper extremity shows:  Left median sensory response shows prolonged latency (4.8 ms).  Left ulnar sensory responses within normal limits. Left median motor response shows prolonged latency (7.0 ms) and reduced amplitude (4.5 mV).  Left ulnar motor responses within normal limits.   Chronic motor axonal loss changes are seen affecting the left abductor pollicis brevis muscle, without accompanying active denervation.    Impression: Left median neuropathy at or distal to the wrist, consistent with a clinical diagnosis of carpal tunnel syndrome.  Overall, these findings are moderate-to-severe in degree electrically.   ___________________________ Narda Amber, DO    Nerve Conduction Studies Anti Sensory Summary Table   Stim Site NR Peak (ms) Norm Peak (ms) P-T Amp (V) Norm P-T Amp  Left Median Anti Sensory (2nd Digit)  34C  Wrist    4.8 <3.8 15.4 >10  Left Ulnar Anti Sensory (5th Digit)  34C  Wrist    2.9 <3.2 23.9 >5   Motor Summary Table   Stim Site NR Onset (ms) Norm Onset (ms) O-P Amp (mV) Norm O-P Amp Site1 Site2 Delta-0 (ms) Dist (cm) Vel (m/s) Norm Vel (m/s)  Left Median Motor (Abd Poll Brev)  34C  Wrist    7.0 <4.0 4.5 >5 Elbow Wrist 5.1 27.0 53 >50  Elbow    12.1  3.6         Left Ulnar Motor (Abd Dig Minimi)  34C  Wrist    2.4 <3.1 7.2 >7 B Elbow Wrist 3.5 21.0 60 >50  B Elbow    5.9  6.5  A Elbow B Elbow 1.6 10.0 63 >50  A Elbow    7.5  5.8          EMG   Side Muscle Ins Act Fibs Psw Fasc Number Recrt  Dur Dur. Amp Amp. Poly Poly. Comment  Left 1stDorInt Nml Nml Nml Nml Nml Nml Nml Nml Nml Nml Nml Nml N/A  Left Abd Poll Brev Nml Nml Nml Nml 1- Rapid Some 1+ Some 1+ Some 1+ N/A  Left PronatorTeres Nml Nml Nml Nml Nml Nml Nml Nml Nml Nml Nml Nml N/A  Left Biceps Nml Nml Nml Nml Nml Nml Nml Nml Nml Nml Nml Nml N/A  Left Triceps Nml Nml Nml Nml Nml Nml Nml Nml Nml Nml Nml Nml N/A  Left Deltoid Nml Nml Nml Nml Nml Nml Nml Nml Nml Nml Nml Nml N/A      Waveforms:

## 2020-09-03 ENCOUNTER — Encounter: Payer: Self-pay | Admitting: Nurse Practitioner

## 2020-09-07 DIAGNOSIS — M79642 Pain in left hand: Secondary | ICD-10-CM | POA: Diagnosis not present

## 2020-09-16 ENCOUNTER — Ambulatory Visit
Admission: EM | Admit: 2020-09-16 | Discharge: 2020-09-16 | Disposition: A | Discharge status: Home / Self Care | Payer: Medicare HMO | Attending: Family Medicine | Admitting: Family Medicine

## 2020-09-16 ENCOUNTER — Encounter: Payer: Self-pay | Admitting: Emergency Medicine

## 2020-09-16 ENCOUNTER — Other Ambulatory Visit: Payer: Self-pay

## 2020-09-16 DIAGNOSIS — R197 Diarrhea, unspecified: Secondary | ICD-10-CM | POA: Diagnosis not present

## 2020-09-16 NOTE — ED Triage Notes (Signed)
Diarrhea x 1 week.  States she saw some blood in her stool today.

## 2020-09-16 NOTE — ED Provider Notes (Signed)
[place]   [id number] 09/16/20 Arrival Time: [id number]  ASSESSMENT & PLAN:  1. Diarrhea, unspecified type    Not worsening. Discussed dark stool with Pepto use.  Reports normal colo-guard within the past two months. Reassuring.  Pending: Labs Reviewed  GASTROINTESTINAL PANEL BY PCR, STOOL (REPLACES STOOL CULTURE)   Tolerating PO fluids. No signs of dehydration.  Reviewed expectations re: course of current medical issues. Questions answered. Outlined signs and symptoms indicating need for more acute intervention. Patient verbalized understanding. After Visit Summary given.   SUBJECTIVE: History from: patient.  Nancy Williams is a 80 y.o. female who presents with complaint of diarrhea; gradual onset; x 1 week; not worsening. Does not wake her from sleep. Today saw BRB in stool. With abdominal cramping over past week. Tolerating PO intake without n/v. Sick contacts: none. Recent travel or camping: none. OTC treatment: Pepto; unsure if helping.  No LMP recorded. Patient is postmenopausal.  Past Surgical History:  Procedure Laterality Date   No previous surgery      OBJECTIVE:  Vitals:   09/16/20 1731  BP: 138/72  Pulse: 78  Resp: 18  Temp: 99 F (37.2 C)  SpO2: 98%    General appearance: alert; no distress Oropharynx: moist Lungs: unlabored Abdomen: soft; non-distended; no significant abdominal tenderness; no guarding or rebound tenderness Extremities: no edema; symmetrical with no gross deformities Skin: warm; dry Neurologic: normal gait Psychological: alert and cooperative; normal mood and affect  Labs: Labs Reviewed  GASTROINTESTINAL PANEL BY PCR, STOOL (REPLACES STOOL CULTURE)    No Known Allergies                                             Past Medical History:  Diagnosis Date   Hypothyroidism    Hashimoto's per [facility] New Patient Packet    Neuropathy    Osteoarthritis    Per patient at New patient appointment    Seasonal allergies     Vitamin D deficiency    Social History   Socioeconomic History   Marital status: Widowed    Spouse name: Not on file   Number of children: Not on file   Years of education: Not on file   Highest education level: Not on file  Occupational History   Not on file  Tobacco Use   Smoking status: Never   Smokeless tobacco: Never  Vaping Use   Vaping Use: Never used  Substance and Sexual Activity   Alcohol use: Yes    Comment: Occasional, last drink 1-2 years ago as of 2021    Drug use: Never   Sexual activity: Not on file  Other Topics Concern   Not on file  Social History Narrative   Per [facility] New Patient Packet Abstracted on 08/19/2019:      Diet: Vegetarian, gluten free       Caffeine: Coffee & Tea      Married, if yes what year: Widow, 1959      Do you live in a house, apartment, assisted living, condo, trailer, ect: House      Is it one or more stories: One stories, one person       Pets: 4 cats       Current/Past profession: Left Blank      Highest level or education completed: 12 th grade       Exercise:  Try          Type and how often: Need to clarify, unable to read what patient wrote          Living Will: Yes   DNR: Yes   POA/HPOA: Yes      Functional Status:   Do you have difficulty bathing or dressing yourself? No   Do you have difficulty preparing food or eating?No   Do you have difficulty managing your medications? No   Do you have difficulty managing your finances? No   Do you have difficulty affording your medications? No   Social Determinants of Health   Financial Resource Strain: Not on file  Food Insecurity: Not on file  Transportation Needs: Not on file  Physical Activity: Not on file  Stress: Not on file  Social Connections: Not on file  Intimate Partner Violence: Not on file   Family History  Problem Relation Age of Onset   Heart disease Mother    Heart failure Mother    Heart disease Father    Prostate cancer Father    Heart  disease Daughter    Diabetes Daughter    Neurologic Disorder Daughter       Vanessa Kick, MD 09/16/20 [phone]

## 2020-09-16 NOTE — Discharge Instructions (Addendum)
Please do your best to ensure adequate fluid intake in order to avoid dehydration. If you find that you are unable to tolerate drinking fluids regularly please proceed to the Emergency Department for evaluation.  You have had labs (gastrointestinal panel by PCR) sent today. We will call you with any significant abnormalities or if there is need to begin or change treatment or pursue further follow up.  You may also review your test results online through [facility]. If you do not have a MyChart account, instructions to sign up should be on your discharge paperwork.

## 2020-09-17 ENCOUNTER — Telehealth: Payer: Self-pay

## 2020-09-17 LAB — GASTROINTESTINAL PANEL BY PCR, STOOL (REPLACES STOOL CULTURE)
Adenovirus F40/41: NOT DETECTED
Astrovirus: NOT DETECTED
Campylobacter species: NOT DETECTED
Cryptosporidium: NOT DETECTED
Cyclospora cayetanensis: NOT DETECTED
E. coli O157: DETECTED — AB
Entamoeba histolytica: NOT DETECTED
Enteroaggregative E coli (EAEC): NOT DETECTED
Enterotoxigenic E coli (ETEC): NOT DETECTED
Giardia lamblia: NOT DETECTED
Norovirus GI/GII: NOT DETECTED
Plesimonas shigelloides: NOT DETECTED
Rotavirus A: NOT DETECTED
Salmonella species: NOT DETECTED
Sapovirus (I, II, IV, and V): NOT DETECTED
Shiga like toxin producing E coli (STEC): DETECTED — AB
Shigella/Enteroinvasive E coli (EIEC): NOT DETECTED
Vibrio cholerae: NOT DETECTED
Vibrio species: NOT DETECTED
Yersinia enterocolitica: NOT DETECTED

## 2020-09-17 NOTE — Telephone Encounter (Signed)
NEW RESULTS  RESULTS: + Shiga toxin E. Coli, E. Coli 0157  RECEIVER: Arther Dames RN  DATE & TIME NOTIFIED: 09/17/2020, 0930  MESSENGER: K. Wooten  PA NOTIFIED: B. Wurst PA  TIME OF NOTIFICATION: 0945  RESPONSE: Provider made aware, no new orders at this time, recommends patient f/u with PCP & to go to ED for new or worsening symptoms.

## 2020-09-18 ENCOUNTER — Ambulatory Visit (INDEPENDENT_AMBULATORY_CARE_PROVIDER_SITE_OTHER): Payer: Medicare HMO | Admitting: Nurse Practitioner

## 2020-09-18 ENCOUNTER — Telehealth: Payer: Self-pay

## 2020-09-18 ENCOUNTER — Encounter: Payer: Self-pay | Admitting: Nurse Practitioner

## 2020-09-18 ENCOUNTER — Other Ambulatory Visit: Payer: Self-pay

## 2020-09-18 DIAGNOSIS — Z0000 Encounter for general adult medical examination without abnormal findings: Secondary | ICD-10-CM | POA: Diagnosis not present

## 2020-09-18 NOTE — Patient Instructions (Addendum)
Nancy Williams , Thank you for taking time to come for your Medicare Wellness Visit. I appreciate your ongoing commitment to your health goals. Please review the following plan we discussed and let me know if I can assist you in the future.   Screening recommendations/referrals: Colonoscopy aged out Mammogram aged out Bone Density up to date Recommended yearly ophthalmology/optometry visit for glaucoma screening and checkup Recommended yearly dental visit for hygiene and checkup  Vaccinations: Influenza vaccine recommended to get yearly. Pneumococcal vaccine up to date Tdap vaccine up to date Shingles vaccine -n/a COVID Booster- RECOMMENDED to get at local pharmacy  Advanced directives: on file.  Conditions/risks identified: advanced age.   Next appointment: 1 year for awv   Preventive Care 20 Years and Older, Female Preventive care refers to lifestyle choices and visits with your health care provider that can promote health and wellness. What does preventive care include? A yearly physical exam. This is also called an annual well check. Dental exams once or twice a year. Routine eye exams. Ask your health care provider how often you should have your eyes checked. Personal lifestyle choices, including: Daily care of your teeth and gums. Regular physical activity. Eating a healthy diet. Avoiding tobacco and drug use. Limiting alcohol use. Practicing safe sex. Taking low-dose aspirin every day. Taking vitamin and mineral supplements as recommended by your health care provider. What happens during an annual well check? The services and screenings done by your health care provider during your annual well check will depend on your age, overall health, lifestyle risk factors, and family history of disease. Counseling  Your health care provider may ask you questions about your: Alcohol use. Tobacco use. Drug use. Emotional well-being. Home and relationship well-being. Sexual  activity. Eating habits. History of falls. Memory and ability to understand (cognition). Work and work Statistician. Reproductive health. Screening  You may have the following tests or measurements: Height, weight, and BMI. Blood pressure. Lipid and cholesterol levels. These may be checked every 5 years, or more frequently if you are over 74 years old. Skin check. Lung cancer screening. You may have this screening every year starting at age 76 if you have a 30-pack-year history of smoking and currently smoke or have quit within the past 15 years. Fecal occult blood test (FOBT) of the stool. You may have this test every year starting at age 25. Flexible sigmoidoscopy or colonoscopy. You may have a sigmoidoscopy every 5 years or a colonoscopy every 10 years starting at age 66. Hepatitis C blood test. Hepatitis B blood test. Sexually transmitted disease (STD) testing. Diabetes screening. This is done by checking your blood sugar (glucose) after you have not eaten for a while (fasting). You may have this done every 1-3 years. Bone density scan. This is done to screen for osteoporosis. You may have this done starting at age 80. Mammogram. This may be done every 1-2 years. Talk to your health care provider about how often you should have regular mammograms. Talk with your health care provider about your test results, treatment options, and if necessary, the need for more tests. Vaccines  Your health care provider may recommend certain vaccines, such as: Influenza vaccine. This is recommended every year. Tetanus, diphtheria, and acellular pertussis (Tdap, Td) vaccine. You may need a Td booster every 10 years. Zoster vaccine. You may need this after age 95. Pneumococcal 13-valent conjugate (PCV13) vaccine. One dose is recommended after age 62. Pneumococcal polysaccharide (PPSV23) vaccine. One dose is recommended after age 39.  Talk to your health care provider about which screenings and vaccines  you need and how often you need them. This information is not intended to replace advice given to you by your health care provider. Make sure you discuss any questions you have with your health care provider. Document Released: 02/13/2015 Document Revised: 10/07/2015 Document Reviewed: 11/18/2014 Elsevier Interactive Patient Education  2017 [place] Prevention in the Home Falls can cause injuries. They can happen to people of all ages. There are many things you can do to make your home safe and to help prevent falls. What can I do on the outside of my home? Regularly fix the edges of walkways and driveways and fix any cracks. Remove anything that might make you trip as you walk through a door, such as a raised step or threshold. Trim any bushes or trees on the path to your home. Use bright outdoor lighting. Clear any walking paths of anything that might make someone trip, such as rocks or tools. Regularly check to see if handrails are loose or broken. Make sure that both sides of any steps have handrails. Any raised decks and porches should have guardrails on the edges. Have any leaves, snow, or ice cleared regularly. Use sand or salt on walking paths during winter. Clean up any spills in your garage right away. This includes oil or grease spills. What can I do in the bathroom? Use night lights. Install grab bars by the toilet and in the tub and shower. Do not use towel bars as grab bars. Use non-skid mats or decals in the tub or shower. If you need to sit down in the shower, use a plastic, non-slip stool. Keep the floor dry. Clean up any water that spills on the floor as soon as it happens. Remove soap buildup in the tub or shower regularly. Attach bath mats securely with double-sided non-slip rug tape. Do not have throw rugs and other things on the floor that can make you trip. What can I do in the bedroom? Use night lights. Make sure that you have a light by your bed that  is easy to reach. Do not use any sheets or blankets that are too big for your bed. They should not hang down onto the floor. Have a firm chair that has side arms. You can use this for support while you get dressed. Do not have throw rugs and other things on the floor that can make you trip. What can I do in the kitchen? Clean up any spills right away. Avoid walking on wet floors. Keep items that you use a lot in easy-to-reach places. If you need to reach something above you, use a strong step stool that has a grab bar. Keep electrical cords out of the way. Do not use floor polish or wax that makes floors slippery. If you must use wax, use non-skid floor wax. Do not have throw rugs and other things on the floor that can make you trip. What can I do with my stairs? Do not leave any items on the stairs. Make sure that there are handrails on both sides of the stairs and use them. Fix handrails that are broken or loose. Make sure that handrails are as long as the stairways. Check any carpeting to make sure that it is firmly attached to the stairs. Fix any carpet that is loose or worn. Avoid having throw rugs at the top or bottom of the stairs. If you do have throw  rugs, attach them to the floor with carpet tape. Make sure that you have a light switch at the top of the stairs and the bottom of the stairs. If you do not have them, ask someone to add them for you. What else can I do to help prevent falls? Wear shoes that: Do not have high heels. Have rubber bottoms. Are comfortable and fit you well. Are closed at the toe. Do not wear sandals. If you use a stepladder: Make sure that it is fully opened. Do not climb a closed stepladder. Make sure that both sides of the stepladder are locked into place. Ask someone to hold it for you, if possible. Clearly mark and make sure that you can see: Any grab bars or handrails. First and last steps. Where the edge of each step is. Use tools that help you  move around (mobility aids) if they are needed. These include: Canes. Walkers. Scooters. Crutches. Turn on the lights when you go into a dark area. Replace any light bulbs as soon as they burn out. Set up your furniture so you have a clear path. Avoid moving your furniture around. If any of your floors are uneven, fix them. If there are any pets around you, be aware of where they are. Review your medicines with your doctor. Some medicines can make you feel dizzy. This can increase your chance of falling. Ask your doctor what other things that you can do to help prevent falls. This information is not intended to replace advice given to you by your health care provider. Make sure you discuss any questions you have with your health care provider. Document Released: 11/13/2008 Document Revised: 06/25/2015 Document Reviewed: 02/21/2014 Elsevier Interactive Patient Education  2017 [employer].

## 2020-09-18 NOTE — Telephone Encounter (Signed)
Nancy Williams, Nancy Williams are scheduled for a virtual visit with your provider today.    Just as we do with appointments in the office, we must obtain your consent to participate.  Your consent will be active for this visit and any virtual visit you may have with one of our providers in the next 365 days.    If you have a MyChart account, I can also send a copy of this consent to you electronically.  All virtual visits are billed to your insurance company just like a traditional visit in the office.  As this is a virtual visit, video technology does not allow for your provider to perform a traditional examination.  This may limit your provider's ability to fully assess your condition.  If your provider identifies any concerns that need to be evaluated in person or the need to arrange testing such as labs, EKG, etc, we will make arrangements to do so.    Although advances in technology are sophisticated, we cannot ensure that it will always work on either your end or our end.  If the connection with a video visit is poor, we may have to switch to a telephone visit.  With either a video or telephone visit, we are not always able to ensure that we have a secure connection.   I need to obtain your verbal consent now.   Are you willing to proceed with your visit today?   Nancy Williams has provided verbal consent on 09/18/2020 for a virtual visit (video or telephone).   [name], [place] 09/18/2020  8:14 AM

## 2020-09-18 NOTE — Progress Notes (Signed)
Subjective:   Nancy Williams is a 80 y.o. female who presents for Medicare Annual (Subsequent) preventive examination.  Review of Systems     Cardiac Risk Factors include: advanced age (>105mn, >>5women);family history of premature cardiovascular disease     Objective:    There were no vitals filed for this visit. There is no height or weight on file to calculate BMI.  Advanced Directives 09/18/2020 05/20/2020 11/20/2019 09/18/2019 09/03/2019 08/19/2019  Does Patient Have a Medical Advance Directive? Yes Yes Yes - Yes Yes  Type of Advance Directive Living will [place] will [place] will [place]Living will [place]eadow AcresLiving will  Does patient want to make changes to medical advance directive? No - Patient declined No - Patient declined No - Patient declined No - Patient declined No - Patient declined No - Patient declined  Copy of [place] Chart? - Yes - validated most recent copy scanned in chart (See row information) Yes - validated most recent copy scanned in chart (See row information) No - copy requested Yes - validated most recent copy scanned in chart (See row information) Yes - validated most recent copy scanned in chart (See row information)    Current Medications (verified) Outpatient Encounter Medications as of 09/18/2020  Medication Sig   Ascorbic Acid (VITAMIN C) 1000 MG tablet Take 1,000 mg by mouth daily.   b complex vitamins tablet Take 1 tablet by mouth daily.   Calcium Carb-Cholecalciferol (CALCIUM 500+D3 PO) Take 1 capsule by mouth daily. 200 units of vit D   Cholecalciferol (VITAMIN D3) 25 MCG (1000 UT) CAPS Take 1 capsule by mouth daily.   Cyanocobalamin (B-12) 5000 MCG CAPS Take 1 capsule by mouth daily.   fluticasone (FLONASE) 50 MCG/ACT nasal spray Place 1 spray into both nostrils as needed for allergies or  rhinitis.   gabapentin (NEURONTIN) 100 MG capsule TAKE 1 CAPSULE BY MOUTH TWICE DAILY AS NEEDED   levothyroxine (SYNTHROID) 75 MCG tablet TAKE 1 TABLET ONCE DAILY 30MINUTES BEFORE BREAKFAST ONAN EMPTY STOMACH   metoprolol succinate (TOPROL-XL) 25 MG 24 hr tablet Take 1/2 (one-half) tablet by mouth once daily   Omega-3 Fatty Acids (OMEGA-3 PO) Take 1 tablet by mouth daily.   UNABLE TO FIND Take 1 tablet by mouth daily. Med Name: CDP (memory) 300 mg   [DISCONTINUED] Magnesium 300 MG CAPS Take 1 capsule by mouth daily.   [DISCONTINUED] Multiple Vitamins-Minerals (ICAPS) TABS Take 1 tablet by mouth daily.   No facility-administered encounter medications on file as of 09/18/2020.    Allergies (verified) Patient has no known allergies.   History: Past Medical History:  Diagnosis Date   [facility]    [facility]    Neuropathy    Osteoarthritis    Per patient at New patient appointment    Seasonal allergies    Vitamin D deficiency    Past Surgical History:  Procedure Laterality Date   No previous surgery     Family History  Problem Relation Age of Onset   Heart disease Mother    Heart failure Mother    Heart disease Father    Prostate cancer Father    Heart disease Daughter    Diabetes Daughter    Neurologic Disorder Daughter    Social History   Socioeconomic History   Marital status: Widowed    Spouse name: Not on file  Number of children: Not on file   Years of education: Not on file   Highest education level: Not on file  Occupational History   Not on file  Tobacco Use   Smoking status: Never   Smokeless tobacco: Never  Vaping Use   Vaping Use: Never used  Substance and Sexual Activity   Alcohol use: Yes    Comment: Occasional, last drink 1-2 years ago as of 2021    Drug use: Never   Sexual activity: Not on file  Other Topics Concern   Not on file  Social History Narrative   Per [facility] New Patient Packet Abstracted on 08/19/2019:       Diet: Vegetarian, gluten free       Caffeine: Coffee & Tea      Married, if yes what year: Widow, 1959      Do you live in a house, apartment, assisted living, condo, trailer, ect: House      Is it one or more stories: One stories, one person       Pets: 4 cats       Current/Past profession: Left Blank      Highest level or education completed: 12 th grade       Exercise:        Try          Type and how often: Need to clarify, unable to read what patient wrote          Living Will: Yes   DNR: Yes   POA/HPOA: Yes      Functional Status:   Do you have difficulty bathing or dressing yourself? No   Do you have difficulty preparing food or eating?No   Do you have difficulty managing your medications? No   Do you have difficulty managing your finances? No   Do you have difficulty affording your medications? No   Social Determinants of Radio broadcast assistant Strain: Not on file  Food Insecurity: Not on file  Transportation Needs: Not on file  Physical Activity: Not on file  Stress: Not on file  Social Connections: Not on file    Tobacco Counseling Counseling given: Not Answered   Clinical Intake:  Pre-visit preparation completed: Yes  Pain : No/denies pain     BMI - recorded: 21 Nutritional Risks: Nausea/ vomitting/ diarrhea Diabetes: No  How often do you need to have someone help you when you read instructions, pamphlets, or other written materials from your doctor or pharmacy?: 1 - Never  Diabetic?no         Activities of Daily Living In your present state of health, do you have any difficulty performing the following activities: 09/18/2020  Hearing? N  Vision? Y  Difficulty concentrating or making decisions? N  Walking or climbing stairs? N  Dressing or bathing? N  Doing errands, shopping? N  Preparing Food and eating ? N  Using the Toilet? N  In the past six months, have you accidently leaked urine? Y  Do you have problems with loss of  bowel control? N  Managing your Medications? N  Managing your Finances? N  Housekeeping or managing your Housekeeping? N  Some recent data might be hidden    Patient Care Team: [name], NP as PCP - General (Geriatric Medicine) [name], MD as PCP - Cardiology (Cardiology) [name], MD as Referring Physician (Dermatology) [name], DO (Optometry) [name], MD as Consulting Physician (Otolaryngology)  Indicate any recent Medical Services  you may have received from other than [facility] providers in the past year (date may be approximate).     Assessment:   This is a routine wellness examination for Natasia.  Hearing/Vision screen Hearing Screening - Comments:: No hearing issues  Vision Screening - Comments:: Last eye exam was 12/2018, Dr.Catter   Dietary issues and exercise activities discussed: Current Exercise Habits: Home exercise routine, Type of exercise: walking;Other - see comments, Time (Minutes): > 60, Frequency (Times/Week): 1, Weekly Exercise (Minutes/Week): 0   Goals Addressed   None    Depression Screen PHQ 2/9 Scores 09/18/2020 05/20/2020 11/20/2019 09/18/2019 08/29/2019 08/19/2019  PHQ - 2 Score 0 0 0 0 0 0    Fall Risk Fall Risk  09/18/2020 05/20/2020 11/20/2019 09/18/2019 08/29/2019  Falls in the past year? 0 0 0 1 0  Number falls in past yr: 0 0 0 0 0  Injury with Fall? 0 0 0 0 0  Risk for fall due to : No Fall Risks - - - -  Follow up Falls evaluation completed - - - -    FALL RISK PREVENTION PERTAINING TO THE HOME:  Any stairs in or around the home? Yes  If so, are there any without handrails? No  Home free of loose throw rugs in walkways, pet beds, electrical cords, etc? Yes  Adequate lighting in your home to reduce risk of falls? Yes   ASSISTIVE DEVICES UTILIZED TO PREVENT FALLS:  Life alert? No  Use of a cane, walker or w/c? No  Grab bars in the bathroom? No  Shower chair or bench in shower? Yes  Elevated toilet seat or a  handicapped toilet? Yes   TIMED UP AND GO:  Was the test performed? No .    Cognitive Function:     6CIT Screen 09/18/2020 09/18/2019  What Year? 4 points 0 points  What month? 0 points 0 points  What time? 0 points 0 points  Count back from 20 0 points 0 points  Months in reverse 0 points 0 points  Repeat phrase 0 points 0 points  Total Score 4 0    Immunizations Immunization History  Administered Date(s) Administered   Fluad Quad(high Dose 65+) 11/20/2019   Influenza-Unspecified 02/16/2018   Moderna SARS-COV2 Booster Vaccination 12/18/2019   Moderna Sars-Covid-2 Vaccination 02/12/2019, 03/15/2019   Pneumococcal Conjugate-13 04/25/2014   Pneumococcal-Unspecified 12/14/2016   Tdap 08/21/2012    TDAP status: Up to date  Flu Vaccine status: Due, Education has been provided regarding the importance of this vaccine. Advised may receive this vaccine at local pharmacy or Health Dept. Aware to provide a copy of the vaccination record if obtained from local pharmacy or Health Dept. Verbalized acceptance and understanding.  Pneumococcal vaccine status: Up to date  Covid-19 vaccine status: Completed vaccines  Qualifies for Shingles Vaccine? Yes   Zostavax completed No   Shingrix Completed?: No.    Education has been provided regarding the importance of this vaccine. Patient has been advised to call insurance company to determine out of pocket expense if they have not yet received this vaccine. Advised may also receive vaccine at local pharmacy or Health Dept. Verbalized acceptance and understanding.  Screening Tests Health Maintenance  Topic Date Due   COVID-19 Vaccine (4 - Booster for Moderna series) 04/16/2020   INFLUENZA VACCINE  08/31/2020   TETANUS/TDAP  08/22/2022   DEXA SCAN  Completed   PNA vac Low Risk Adult  Completed   HPV VACCINES  Aged Out   Zoster  Vaccines- Shingrix  Discontinued    Health Maintenance  Health Maintenance Due  Topic Date Due   COVID-19  Vaccine (4 - Booster for Moderna series) 04/16/2020   INFLUENZA VACCINE  08/31/2020    Colorectal cancer screening: No longer required.   Mammogram status: No longer required due to aged out.  Bone Density status: Completed 08/30/2019. Results reflect: Bone density results: OSTEOPENIA. Repeat every 2 years.  Lung Cancer Screening: (Low Dose CT Chest recommended if Age 22-80 years, 30 pack-year currently smoking OR have quit w/in 15years.) does not qualify.     Additional Screening:  Hepatitis C Screening: does not qualify;   Vision Screening: Recommended annual ophthalmology exams for early detection of glaucoma and other disorders of the eye. Is the patient up to date with their annual eye exam?  Yes  Who is the provider or what is the name of the office in which the patient attends annual eye exams? Dr Jorja Loa If pt is not established with a provider, would they like to be referred to a provider to establish care? No .   Dental Screening: Recommended annual dental exams for proper oral hygiene  Community Resource Referral / Chronic Care Management: CRR required this visit?  No   CCM required this visit?  No      Plan:     I have personally reviewed and noted the following in the patient's chart:   Medical and social history Use of alcohol, tobacco or illicit drugs  Current medications and supplements including opioid prescriptions.  Functional ability and status Nutritional status Physical activity Advanced directives List of other physicians Hospitalizations, surgeries, and ER visits in previous 12 months Vitals Screenings to include cognitive, depression, and falls Referrals and appointments  In addition, I have reviewed and discussed with patient certain preventive protocols, quality metrics, and best practice recommendations. A written personalized care plan for preventive services as well as general preventive health recommendations were provided to patient.      [name], NP   09/18/2020    Virtual Visit via Telephone Note  I connected withNAME@ on 09/18/20 at  8:30 AM EDT by telephone and verified that I am speaking with the correct person using two identifiers.  Location: Patient: home Provider: [facility] clinic   I discussed the limitations, risks, security and privacy concerns of performing an evaluation and management service by telephone and the availability of in person appointments. I also discussed with the patient that there may be a patient responsible charge related to this service. The patient expressed understanding and agreed to proceed.   I discussed the assessment and treatment plan with the patient. The patient was provided an opportunity to ask questions and all were answered. The patient agreed with the plan and demonstrated an understanding of the instructions.   The patient was advised to call back or seek an in-person evaluation if the symptoms worsen or if the condition fails to improve as anticipated.  I provided 18 minutes of non-face-to-face time during this encounter.  Carlos American. Harle Battiest Avs printed and mailed

## 2020-09-18 NOTE — Progress Notes (Signed)
   This service is provided via telemedicine  No vital signs collected/recorded due to the encounter was a telemedicine visit.   Location of patient (ex: home, work):  Home  Patient consents to a telephone visit: Yes, see telephone visit dated 09/18/20  Location of the provider (ex: office, home):  [facility], Office   Name of any referring provider:  N/A  Names of all persons participating in the telemedicine service and their role in the encounter:  S.Chrae B/CMA, Sherrie Mustache, NP, and Patient   Time spent on call:  9 min with medical assistant

## 2020-09-24 ENCOUNTER — Other Ambulatory Visit: Payer: Self-pay

## 2020-09-24 ENCOUNTER — Telehealth (INDEPENDENT_AMBULATORY_CARE_PROVIDER_SITE_OTHER): Payer: Medicare HMO | Admitting: Nurse Practitioner

## 2020-09-24 ENCOUNTER — Encounter: Payer: Self-pay | Admitting: Nurse Practitioner

## 2020-09-24 DIAGNOSIS — U071 COVID-19: Secondary | ICD-10-CM

## 2020-09-24 NOTE — Progress Notes (Signed)
Careteam: Patient Care Team: [name], NP as PCP - General (Geriatric Medicine) [name], MD as PCP - Cardiology (Cardiology) [name], MD as Referring Physician (Dermatology) [name], DO (Optometry) [name], MD as Consulting Physician (Otolaryngology)  Advanced Directive information    No Known Allergies  Chief Complaint  Patient presents with   Acute Visit    Patient with sore throat, headache, chilled, temp 101, achy, no energy, and no appetite. Performed covid test yesterday and it was negative. Patient is feeling worse today versus yesterday.      HPI: Patient is a 80 y.o. female for positive COVID.  Feels like she has a bad case of the flu.  Temperature of 101 that she has checked.  99.2.  Symptoms include mild congestion, headache, fever and chills with cough. No chest congestion, shortness of breath.  Able to drink fluids. No appetite.    Review of Systems:  Review of Systems  Constitutional:  Positive for chills, fever and malaise/fatigue.  HENT:  Positive for congestion. Negative for sinus pain.   Respiratory:  Positive for cough. Negative for sputum production and shortness of breath.   Cardiovascular:  Negative for chest pain and leg swelling.  Neurological:  Positive for headaches.   Past Medical History:  Diagnosis Date   Hypothyroidism    Hashimoto's per [facility] New Patient Packet    Neuropathy    Osteoarthritis    Per patient at New patient appointment    Seasonal allergies    Vitamin D deficiency    Past Surgical History:  Procedure Laterality Date   No previous surgery     Social History:   reports that she has never smoked. She has never used smokeless tobacco. She reports current alcohol use. She reports that she does not use drugs.  Family History  Problem Relation Age of Onset   Heart disease Mother    Heart failure Mother    Heart disease Father    Prostate cancer Father    Heart disease Daughter     Diabetes Daughter    Neurologic Disorder Daughter     Medications: Patient's Medications  New Prescriptions   No medications on file  Previous Medications   ASCORBIC ACID (VITAMIN C) 1000 MG TABLET    Take 1,000 mg by mouth daily.   B COMPLEX VITAMINS TABLET    Take 1 tablet by mouth daily.   CALCIUM CARB-CHOLECALCIFEROL (CALCIUM 500+D3 PO)    Take 1 capsule by mouth daily. 200 units of vit D   CHOLECALCIFEROL (VITAMIN D3) 25 MCG (1000 UT) CAPS    Take 1 capsule by mouth daily.   CYANOCOBALAMIN (B-12) 5000 MCG CAPS    Take 1 capsule by mouth daily.   FLUTICASONE (FLONASE) 50 MCG/ACT NASAL SPRAY    Place 1 spray into both nostrils as needed for allergies or rhinitis.   GABAPENTIN (NEURONTIN) 100 MG CAPSULE    TAKE 1 CAPSULE BY MOUTH TWICE DAILY AS NEEDED   LEVOTHYROXINE (SYNTHROID) 75 MCG TABLET    TAKE 1 TABLET ONCE DAILY 30MINUTES BEFORE BREAKFAST ONAN EMPTY STOMACH   METOPROLOL SUCCINATE (TOPROL-XL) 25 MG 24 HR TABLET    Take 1/2 (one-half) tablet by mouth once daily   OMEGA-3 FATTY ACIDS (OMEGA-3 PO)    Take 1 tablet by mouth daily.   UNABLE TO FIND    Take 1 tablet by mouth daily. Med Name: CDP (memory) 300 mg  Modified Medications   No medications on file  Discontinued Medications   No medications on file    Physical Exam:  There were no vitals filed for this visit. There is no height or weight on file to calculate BMI. Wt Readings from Last 3 Encounters:  05/20/20 128 lb (58.1 kg)  11/20/19 122 lb (55.3 kg)  08/28/19 120 lb (54.4 kg)    Physical Exam Constitutional:      Appearance: Normal appearance.  Pulmonary:     Effort: Pulmonary effort is normal.  Neurological:     Mental Status: She is alert and oriented to person, place, and time.  Limited due to virtual visit.   Labs reviewed: Basic Metabolic Panel: Recent Labs    05/20/20 0943  NA 139  K 4.5  CL 103  CO2 27  GLUCOSE 84  BUN 17  CREATININE 0.80  CALCIUM 9.7  TSH 2.42   Liver Function  Tests: Recent Labs    05/20/20 0943  AST 23  ALT 14  BILITOT 0.4  PROT 7.1   No results for input(s): LIPASE, AMYLASE in the last 8760 hours. No results for input(s): AMMONIA in the last 8760 hours. CBC: Recent Labs    05/20/20 0943  WBC 8.7  NEUTROABS 5,107  HGB 14.2  HCT 43.1  MCV 90.9  PLT 269   Lipid Panel: Recent Labs    05/20/20 0943  CHOL 235*  HDL 96  LDLCALC 120*  TRIG 86  CHOLHDL 2.4   TSH: Recent Labs    05/20/20 0943  TSH 2.42   A1C: Lab Results  Component Value Date   HGBA1C 5.4 05/31/2019     Assessment/Plan 1. COVID-19 -at this time opts for supportive care. Did review EUA paxlovid but based on symptoms will wait at this time. She will call office back if symptoms worsen or she continues to feel poorly. --recommended to take Vit C 1000 mg twice daily, Vit D 5000 units daily and zinc 50 mg daily for 7 days -maintain proper hydration -tylenol 650 mg by mouth every 8 hours as needed fever/body aches.  -mucinex DM twice daily as needed for cough and chest congestion -do not sit in bed all day, sit up in chair and walk around as tolerated -nasal wash daily and nasal saline PRN.  -quarantine for at least 5 days, wear mask 5 additional days.  -education provided on when to follow up and when to seek immediate medication attention through the ED.    Next appt: 11/20/2020 as scheduled. Carlos American. Harle Battiest  [facility] [phone]    Virtual Visit via Deloris Ping  I connected with patient on 09/24/20 at  3:45 PM EDT by video and verified that I am speaking with the correct person using two identifiers.  Location: Patient: home Provider: [place]   I discussed the limitations, risks, security and privacy concerns of performing an evaluation and management service by telephone and the availability of in person appointments. I also discussed with the patient that there may be a patient responsible charge related  to this service. The patient expressed understanding and agreed to proceed.   I discussed the assessment and treatment plan with the patient. The patient was provided an opportunity to ask questions and all were answered. The patient agreed with the plan and demonstrated an understanding of the instructions.   The patient was advised to call back or seek an in-person evaluation if the symptoms worsen or if the condition fails to improve as anticipated.  I provided  15 minutes of non-face-to-face time during this encounter.  Carlos American. Harle Battiest Avs printed and mailed

## 2020-09-24 NOTE — Progress Notes (Signed)
   This service is provided via telemedicine  No vital signs collected/recorded due to the encounter was a telemedicine visit.   Location of patient (ex: home, work):  Home  Patient consents to a telephone visit: Yes, see telephone visit dated 09/24/20  Location of the provider (ex: office, home):  [facility], Office   Name of any referring provider:  N/A  Names of all persons participating in the telemedicine service and their role in the encounter:  S.Chrae B/CMA, Sherrie Mustache, NP, and Patient   Time spent on call:  5 min with medical assistant

## 2020-10-05 ENCOUNTER — Other Ambulatory Visit: Payer: Self-pay | Admitting: Nurse Practitioner

## 2020-10-05 DIAGNOSIS — G629 Polyneuropathy, unspecified: Secondary | ICD-10-CM

## 2020-10-26 ENCOUNTER — Encounter: Payer: Self-pay | Admitting: Nurse Practitioner

## 2020-10-30 ENCOUNTER — Ambulatory Visit: Payer: Medicare HMO | Admitting: Neurology

## 2020-11-16 ENCOUNTER — Ambulatory Visit (INDEPENDENT_AMBULATORY_CARE_PROVIDER_SITE_OTHER): Payer: Medicare HMO | Admitting: Nurse Practitioner

## 2020-11-16 ENCOUNTER — Other Ambulatory Visit: Payer: Self-pay

## 2020-11-16 ENCOUNTER — Encounter: Payer: Self-pay | Admitting: Nurse Practitioner

## 2020-11-16 VITALS — BP 128/74 | HR 67 | Temp 97.3°F | Ht 65.0 in | Wt 131.0 lb

## 2020-11-16 DIAGNOSIS — Z23 Encounter for immunization: Secondary | ICD-10-CM

## 2020-11-16 DIAGNOSIS — G629 Polyneuropathy, unspecified: Secondary | ICD-10-CM

## 2020-11-16 DIAGNOSIS — E782 Mixed hyperlipidemia: Secondary | ICD-10-CM | POA: Diagnosis not present

## 2020-11-16 DIAGNOSIS — E063 Autoimmune thyroiditis: Secondary | ICD-10-CM | POA: Diagnosis not present

## 2020-11-16 DIAGNOSIS — R269 Unspecified abnormalities of gait and mobility: Secondary | ICD-10-CM | POA: Diagnosis not present

## 2020-11-16 DIAGNOSIS — E038 Other specified hypothyroidism: Secondary | ICD-10-CM

## 2020-11-16 DIAGNOSIS — R2689 Other abnormalities of gait and mobility: Secondary | ICD-10-CM | POA: Diagnosis not present

## 2020-11-16 DIAGNOSIS — M8580 Other specified disorders of bone density and structure, unspecified site: Secondary | ICD-10-CM

## 2020-11-16 NOTE — Progress Notes (Signed)
Careteam: Patient Care Team: [name], NP as PCP - General (Geriatric Medicine) [name], MD as PCP - Cardiology (Cardiology) [name], MD as Referring Physician (Dermatology) [name], DO (Optometry) [name], MD as Consulting Physician (Otolaryngology)  PLACE OF SERVICE:  [place] Directive information Does Patient Have a Medical Advance Directive?: Yes, Type of Advance Directive: Living will, Does patient want to make changes to medical advance directive?: No - Patient declined  No Known Allergies  Chief Complaint  Patient presents with   Medical Management of Chronic Issues    6 month follow-up. Flu vaccine today. Discuss need for covid #4 or exclude/postpone if patient refuses. [name]/o balance issues, weakness and shaky at times. Patient gets lethargic at time and has to lay down. Patient will see Neurologist soon.      HPI: Patient is a 80 y.o. female for routine follow up.   For 2 years she is having to be mindful of how she walks. She feels like her mind is going ahead of the rest of her body.  Feels like she is dragging her left foot.  She gets weak and lethargic. She has to go lay down for a little while then gets up and is better.  She feels weak and shaky but no visual shaking. She reports it happens after she eats a well.  Denies anxiety, depression and insomnia.   PT did not help at all. Went to the nutritionist. Saw chiropractor and cardiologist but no findings that explained symptoms.   Wearing a brace on her left wrist and taking gabapentin and that is helping now vs surgery for her carpel tunnel.   Daughter reports she was zoned out for a while. She was taking a supplement that was making her very "foggy" stopped this.   Worse when she is tired.   Reports overall energy is better. Taking cellsalt and this has helped.   Diagnosed with neuropathy over 20 years ago. This is not any worse.  Unable to  exercise due to balance issues. She is eating more.   She had ecoli diarrhea and then COVID. She is slowly getting back from there.  Review of Systems:  Review of Systems  Constitutional:  Positive for malaise/fatigue. Negative for chills, fever and weight loss.  HENT:  Negative for tinnitus.   Respiratory:  Negative for cough, sputum production and shortness of breath.   Cardiovascular:  Negative for chest pain, palpitations and leg swelling.  Gastrointestinal:  Negative for abdominal pain, constipation, diarrhea and heartburn.  Genitourinary:  Negative for dysuria, frequency and urgency.  Musculoskeletal:  Negative for back pain, falls, joint pain and myalgias.  Skin: Negative.   Neurological:  Positive for focal weakness and weakness. Negative for dizziness and headaches.       Unsteady gait.  Foot drop when walking on right  Psychiatric/Behavioral:  Negative for depression and memory loss. The patient is not nervous/anxious and does not have insomnia.    Past Medical History:  Diagnosis Date   Hypothyroidism    Hashimoto's per [facility] New Patient Packet    Neuropathy    Osteoarthritis    Per patient at New patient appointment    Seasonal allergies    Vitamin D deficiency    Past Surgical History:  Procedure Laterality Date   No previous surgery     Social History:   reports that she has never smoked. She has never used smokeless tobacco. She reports current alcohol  use. She reports that she does not use drugs.  Family History  Problem Relation Age of Onset   Heart disease Mother    Heart failure Mother    Heart disease Father    Prostate cancer Father    Heart disease Daughter    Diabetes Daughter    Neurologic Disorder Daughter     Medications: Patient's Medications  New Prescriptions   No medications on file  Previous Medications   ASCORBIC ACID (VITAMIN C) 1000 MG TABLET    Take 1,000 mg by mouth daily.   B COMPLEX VITAMINS TABLET    Take 1 tablet by mouth  daily.   CALCIUM CARB-CHOLECALCIFEROL (CALCIUM 500+D3 PO)    Take 1 capsule by mouth daily. 200 units of vit D   CHOLECALCIFEROL (VITAMIN D3) 25 MCG (1000 UT) CAPS    Take 1 capsule by mouth daily.   CYANOCOBALAMIN (B-12) 5000 MCG CAPS    Take 1 capsule by mouth daily.   FLUTICASONE (FLONASE) 50 MCG/ACT NASAL SPRAY    Place 1 spray into both nostrils as needed for allergies or rhinitis.   GABAPENTIN (NEURONTIN) 100 MG CAPSULE    TAKE 1 CAPSULE BY MOUTH TWICE DAILY AS NEEDED   LEVOTHYROXINE (SYNTHROID) 75 MCG TABLET    TAKE 1 TABLET ONCE DAILY 30MINUTES BEFORE BREAKFAST ONAN EMPTY STOMACH   METOPROLOL SUCCINATE (TOPROL-XL) 25 MG 24 HR TABLET    Take 1/2 (one-half) tablet by mouth once daily   OMEGA-3 FATTY ACIDS (OMEGA-3 PO)    Take 1 tablet by mouth daily.  Modified Medications   No medications on file  Discontinued Medications   UNABLE TO FIND    Take 1 tablet by mouth daily. Med Name: CDP (memory) 300 mg    Physical Exam:  Vitals:   11/16/20 1453  BP: 128/74  Pulse: 67  Temp: (!) 97.3 F (36.3 C)  TempSrc: Temporal  SpO2: 97%  Weight: 131 lb (59.4 kg)  Height: '5\' 5"'  (1.651 m)   Body mass index is 21.8 kg/m. Wt Readings from Last 3 Encounters:  11/16/20 131 lb (59.4 kg)  05/20/20 128 lb (58.1 kg)  11/20/19 122 lb (55.3 kg)    Physical Exam Constitutional:      General: She is not in acute distress.    Appearance: She is well-developed. She is not diaphoretic.  HENT:     Head: Normocephalic and atraumatic.     Mouth/Throat:     Pharynx: No oropharyngeal exudate.  Eyes:     Conjunctiva/sclera: Conjunctivae normal.     Pupils: Pupils are equal, round, and reactive to light.  Cardiovascular:     Rate and Rhythm: Normal rate and regular rhythm.     Heart sounds: Normal heart sounds.  Pulmonary:     Effort: Pulmonary effort is normal.     Breath sounds: Normal breath sounds.  Abdominal:     General: Bowel sounds are normal.     Palpations: Abdomen is soft.   Musculoskeletal:     Cervical back: Normal range of motion and neck supple.     Right lower leg: No edema.     Left lower leg: No edema.  Skin:    General: Skin is warm and dry.  Neurological:     Mental Status: She is alert and oriented to person, place, and time.     Coordination: Coordination is intact.     Gait: Gait abnormal.     Comments: Shuffled gait.  Grip strength diminished bilaterally  Psychiatric:        Mood and Affect: Mood normal.    Labs reviewed: Basic Metabolic Panel: Recent Labs    05/20/20 0943  NA 139  K 4.5  CL 103  CO2 27  GLUCOSE 84  BUN 17  CREATININE 0.80  CALCIUM 9.7  TSH 2.42   Liver Function Tests: Recent Labs    05/20/20 0943  AST 23  ALT 14  BILITOT 0.4  PROT 7.1   No results for input(s): LIPASE, AMYLASE in the last 8760 hours. No results for input(s): AMMONIA in the last 8760 hours. CBC: Recent Labs    05/20/20 0943  WBC 8.7  NEUTROABS 5,107  HGB 14.2  HCT 43.1  MCV 90.9  PLT 269   Lipid Panel: Recent Labs    05/20/20 0943  CHOL 235*  HDL 96  LDLCALC 120*  TRIG 86  CHOLHDL 2.4   TSH: Recent Labs    05/20/20 0943  TSH 2.42   A1C: Lab Results  Component Value Date   HGBA1C 5.4 05/31/2019     Assessment/Plan 1. Need for influenza vaccination - Flu Vaccine QUAD High Dose(Fluad)  2. Hypothyroidism due to Hashimoto's thyroiditis Continues on synthroid 75 mcg - TSH  3. Osteopenia, unspecified location -Recommended to take calcium 600 mg twice daily with Vitamin D 2000 units daily and weight bearing activity 30 mins/5 days a week (has a hard time doing this activity due to balance issues) - CBC with Differential/Platelet - CMP with eGFR([facility])  4. Mixed hyperlipidemia -continue dietary modification  5. Neuropathy -ongoing, continues on gabapentin 100 mg twice daily.   6. Abnormal gait Worsening gait and balance issues- has neurology evaluation scheduled this week.   7. Imbalance Completed  pt without improvement, has appt for neurology this week.    Next appt: 4 months.  Carlos American. [place] [place] [facility] [phone]

## 2020-11-17 LAB — COMPLETE METABOLIC PANEL WITH GFR
AG Ratio: 1.7 (calc) (ref 1.0–2.5)
ALT: 16 U/L (ref 6–29)
AST: 27 U/L (ref 10–35)
Albumin: 4.6 g/dL (ref 3.6–5.1)
Alkaline phosphatase (APISO): 73 U/L (ref 37–153)
BUN/Creatinine Ratio: 34 (calc) — ABNORMAL HIGH (ref 6–22)
BUN: 27 mg/dL — ABNORMAL HIGH (ref 7–25)
CO2: 27 mmol/L (ref 20–32)
Calcium: 10 mg/dL (ref 8.6–10.4)
Chloride: 104 mmol/L (ref 98–110)
Creat: 0.8 mg/dL (ref 0.60–0.95)
Globulin: 2.7 g/dL (calc) (ref 1.9–3.7)
Glucose, Bld: 80 mg/dL (ref 65–139)
Potassium: 4.5 mmol/L (ref 3.5–5.3)
Sodium: 139 mmol/L (ref 135–146)
Total Bilirubin: 0.3 mg/dL (ref 0.2–1.2)
Total Protein: 7.3 g/dL (ref 6.1–8.1)
eGFR: 74 mL/min/{1.73_m2} (ref >=60)

## 2020-11-17 LAB — CBC WITH DIFFERENTIAL/PLATELET
Absolute Monocytes: 901 cells/uL (ref 200–950)
Basophils Absolute: 36 cells/uL (ref 0–200)
Basophils Relative: 0.4 %
Eosinophils Absolute: 237 cells/uL (ref 15–500)
Eosinophils Relative: 2.6 %
HCT: 42.3 % (ref 35.0–45.0)
Hemoglobin: 13.7 g/dL (ref 11.7–15.5)
Lymphs Abs: 2921 cells/uL (ref 850–3900)
MCH: 29.8 pg (ref 27.0–33.0)
MCHC: 32.4 g/dL (ref 32.0–36.0)
MCV: 92.2 fL (ref 80.0–100.0)
MPV: 11.4 fL (ref 7.5–12.5)
Monocytes Relative: 9.9 %
Neutro Abs: 5005 cells/uL (ref 1500–7800)
Neutrophils Relative %: 55 %
Platelets: 247 10*3/uL (ref 140–400)
RBC: 4.59 10*6/uL (ref 3.80–5.10)
RDW: 13.1 % (ref 11.0–15.0)
Total Lymphocyte: 32.1 %
WBC: 9.1 10*3/uL (ref 3.8–10.8)

## 2020-11-17 LAB — TSH: TSH: 0.83 mIU/L (ref 0.40–4.50)

## 2020-11-18 ENCOUNTER — Ambulatory Visit: Payer: Medicare HMO | Admitting: Nurse Practitioner

## 2020-11-18 ENCOUNTER — Other Ambulatory Visit: Payer: Self-pay

## 2020-11-18 ENCOUNTER — Ambulatory Visit: Payer: Medicare HMO | Admitting: Neurology

## 2020-11-18 ENCOUNTER — Encounter: Payer: Self-pay | Admitting: Neurology

## 2020-11-18 VITALS — BP 137/71 | HR 76 | Ht 65.0 in | Wt 130.0 lb

## 2020-11-18 DIAGNOSIS — R413 Other amnesia: Secondary | ICD-10-CM

## 2020-11-18 DIAGNOSIS — R292 Abnormal reflex: Secondary | ICD-10-CM | POA: Diagnosis not present

## 2020-11-18 DIAGNOSIS — G629 Polyneuropathy, unspecified: Secondary | ICD-10-CM | POA: Diagnosis not present

## 2020-11-18 DIAGNOSIS — R2681 Unsteadiness on feet: Secondary | ICD-10-CM | POA: Diagnosis not present

## 2020-11-18 DIAGNOSIS — M4807 Spinal stenosis, lumbosacral region: Secondary | ICD-10-CM | POA: Diagnosis not present

## 2020-11-18 NOTE — Patient Instructions (Addendum)
MRI brain wo contrast  MRI lumbar spine wo contrast   We will call you with the results and let you know the next step

## 2020-11-18 NOTE — Progress Notes (Signed)
[facility] Clinic Note - Initial Visit   Date: 11/18/20  Nancy Williams MRN: [id number] DOB: 1940/07/28   Dear Sherrie Mustache, NP:  Thank you for your kind referral of Nancy Williams for consultation of neuropathy. Although her history is well known to you, please allow [place] to reiterate it for the purpose of our medical record. The patient was accompanied to the clinic by daughter who also provides collateral information.     History of Present Illness: Nancy Williams is a 80 y.o. right-handed female with hypothyroidism, palpitations, and left carpal tunnel syndrome presenting for evaluation of neuropathy.  She was diagnosed with neuropathy 18 years ago manifesting with burning pain.  She was recommended to wear good shoes which alleviated her burning pain.  She now has numbness in the soles of the feet, but no tingling or pain.   Over the past 6 months, she has noticed greater problems with imbalance which has progressively been getting worse.  She walks unassisted, but tends to lean on things for support.  She has not suffered any falls.  She lives alone in a one-level home.  She retired from office work.  Her drives, manages medication and finances, and does her own household chores.   Her daughter also says that she has spells where she appears with a blank stare.  Her memory is not as great and for the past 2 years.  Patient feels that she is not "right", but unable to find a reason for this.  They would like MRI brain.   No history of diabetes.  Nonsmoker, rare alcohol use.    Out-side paper records, electronic medical record, and images have been reviewed where available and summarized as:  Lab Results  Component Value Date   HGBA1C 5.4 05/31/2019   Lab Results  Component Value Date   VITAMINB12 2,000 08/30/2018   Lab Results  Component Value Date   TSH 0.83 11/16/2020   No results found for: ESRSEDRATE, POCTSEDRATE  Past Medical History:   Diagnosis Date   Hypothyroidism    Hashimoto's per [facility] New Patient Packet    Neuropathy    Osteoarthritis    Per patient at New patient appointment    Seasonal allergies    Vitamin D deficiency     Past Surgical History:  Procedure Laterality Date   No previous surgery       Medications:  Outpatient Encounter Medications as of 11/18/2020  Medication Sig   Ascorbic Acid (VITAMIN C) 1000 MG tablet Take 1,000 mg by mouth daily.   b complex vitamins tablet Take 1 tablet by mouth daily.   Calcium Carb-Cholecalciferol (CALCIUM 500+D3 PO) Take 1 capsule by mouth daily. 200 units of vit D   Cholecalciferol (VITAMIN D3) 25 MCG (1000 UT) CAPS Take 1 capsule by mouth daily.   Cyanocobalamin (B-12) 5000 MCG CAPS Take 1 capsule by mouth daily.   fluticasone (FLONASE) 50 MCG/ACT nasal spray Place 1 spray into both nostrils as needed for allergies or rhinitis.   gabapentin (NEURONTIN) 100 MG capsule TAKE 1 CAPSULE BY MOUTH TWICE DAILY AS NEEDED   levothyroxine (SYNTHROID) 75 MCG tablet TAKE 1 TABLET ONCE DAILY 30MINUTES BEFORE BREAKFAST ONAN EMPTY STOMACH   metoprolol succinate (TOPROL-XL) 25 MG 24 hr tablet Take 1/2 (one-half) tablet by mouth once daily   Omega-3 Fatty Acids (OMEGA-3 PO) Take 1 tablet by mouth daily.   No facility-administered encounter medications on file as of 11/18/2020.    Allergies: No Known  Allergies  Family History: Family History  Problem Relation Age of Onset   Heart disease Mother    Heart failure Mother    Heart disease Father    Prostate cancer Father    Heart disease Daughter    Diabetes Daughter    Neurologic Disorder Daughter     Social History: Social History   Tobacco Use   Smoking status: Never   Smokeless tobacco: Never  Vaping Use   Vaping Use: Never used  Substance Use Topics   Alcohol use: Yes    Comment: Occasional, last drink 1-2 years ago as of 2021    Drug use: Never   Social History   Social History Narrative   Per [facility] New  Patient Packet Abstracted on 08/19/2019:      Diet: Vegetarian, gluten free       Caffeine: Coffee & Tea      Married, if yes what year: Widow, 1959      Do you live in a house, apartment, assisted living, condo, trailer, ect: House      Is it one or more stories: One stories, one person       Pets: 4 cats       Current/Past profession: Left Blank      Highest level or education completed: 12 th grade       Exercise:        Try          Type and how often: Need to clarify, unable to read what patient wrote          Living Will: Yes   DNR: Yes   POA/HPOA: Yes      Functional Status:   Do you have difficulty bathing or dressing yourself? No   Do you have difficulty preparing food or eating?No   Do you have difficulty managing your medications? No   Do you have difficulty managing your finances? No   Do you have difficulty affording your medications? No         Right Handed    Lives in a one story home     Vital Signs:  BP 137/71   Pulse 76   Ht 5\' 5"  (1.651 m)   Wt 130 lb (59 kg)   SpO2 98%   BMI 21.63 kg/m   Neurological Exam: MENTAL STATUS including orientation to time, place, person, recent and remote memory, attention span and concentration, language, and fund of knowledge is normal.  Speech is not dysarthric.  CRANIAL NERVES: II:  No visual field defects.   III-IV-VI: Pupils equal round and reactive to light.  Normal conjugate, extra-ocular eye movements in all directions of gaze.  No nystagmus.  No ptosis.   V:  Normal facial sensation.    VII:  Normal facial symmetry and movements.   VIII:  Normal hearing and vestibular function.   IX-X:  Normal palatal movement.   XI:  Normal shoulder shrug and head rotation.   XII:  Normal tongue strength and range of motion, no deviation or fasciculation.  MOTOR:  No atrophy, fasciculations or abnormal movements.  No pronator drift.   Upper Extremity:  Right  Left  Deltoid  5/5   5/5   Biceps  5/5   5/5   Triceps   5/5   5/5   Infraspinatus 5/5  5/5  Medial pectoralis 5/5  5/5  Wrist extensors  5/5   5/5   Wrist flexors  5/5   5/5   Finger  extensors  5/5   5/5   Finger flexors  5/5   5/5   Dorsal interossei  5/5   5/5   Abductor pollicis  5/5   5/5   Tone (Ashworth scale)  0  0   Lower Extremity:  Right  Left  Hip flexors  5/5   5/5   Hip extensors  5/5   5/5   Adductor 5/5  5/5  Abductor 5/5  5/5  Knee flexors  5/5   5/5   Knee extensors  5/5   5/5   Dorsiflexors  5/5   5/5   Plantarflexors  5/5   5/5   Toe extensors  5/5   5/5   Toe flexors  5/5   5/5   Tone (Ashworth scale)  0  0   MSRs:  Right        Left                  brachioradialis 2+  2+  biceps 2+  2+  triceps 2+  2+  patellar 3+  3+  ankle jerk 1+  1+  Hoffman no  no  plantar response down  down   SENSORY:  Vibration diminished at the great toe bilaterally, temperature and pin prick is intact.  Rhomberg sign is positive.  COORDINATION/GAIT: Normal finger-to- nose-finger.  Intact rapid alternating movements bilaterally.  Gait appears wide-based, unsteady with turns.  Unable to perform tandem gait.  Stressed gait intact.   IMPRESSION: Gait unsteadiness out of proportion than expected with neuropathy, especially given that her neuropathy has been stable.  Her patella reflexes are brisk and I would like to evaluate for canal stenosis with MRI lumbar spine to further look into her gait difficulty.  Unfortunately, she has not appreciated any benefit with PT.  Memory changes, subjective.  She remains highly independent with IADLs and ADLs.  Will proceed with MRI brain, as requested. Idiopathic peripheral neuropathy affecting the feet, stable.   Further recommendations pending results.   Thank you for allowing me to participate in patient's care.  If I can answer any additional questions, I would be pleased to do so.    Sincerely,    Skarlette Lattner K. Posey Pronto, DO

## 2020-11-20 ENCOUNTER — Ambulatory Visit: Payer: Medicare HMO | Admitting: Nurse Practitioner

## 2020-11-24 ENCOUNTER — Other Ambulatory Visit: Payer: Self-pay | Admitting: Nurse Practitioner

## 2020-11-24 DIAGNOSIS — G629 Polyneuropathy, unspecified: Secondary | ICD-10-CM

## 2020-11-30 ENCOUNTER — Telehealth: Payer: Self-pay | Admitting: Neurology

## 2020-11-30 NOTE — Telephone Encounter (Signed)
[employer], clinicals sent.

## 2020-11-30 NOTE — Telephone Encounter (Signed)
Patient called and said she had an MRI scheduled for tomorrow but it was cancelled due to the authorization not being done.  She'd like an update so she can reschedule.

## 2020-12-01 ENCOUNTER — Ambulatory Visit (HOSPITAL_COMMUNITY): Payer: Medicare HMO

## 2020-12-03 ENCOUNTER — Ambulatory Visit (HOSPITAL_COMMUNITY)
Admission: RE | Admit: 2020-12-03 | Discharge: 2020-12-03 | Disposition: A | Discharge status: Home / Self Care | Payer: Medicare HMO | Source: Ambulatory Visit | Attending: Neurology | Admitting: Neurology

## 2020-12-03 ENCOUNTER — Other Ambulatory Visit: Payer: Self-pay

## 2020-12-03 DIAGNOSIS — M48061 Spinal stenosis, lumbar region without neurogenic claudication: Secondary | ICD-10-CM | POA: Diagnosis not present

## 2020-12-03 DIAGNOSIS — M4699 Unspecified inflammatory spondylopathy, multiple sites in spine: Secondary | ICD-10-CM | POA: Diagnosis not present

## 2020-12-03 DIAGNOSIS — R2681 Unsteadiness on feet: Secondary | ICD-10-CM

## 2020-12-03 DIAGNOSIS — R413 Other amnesia: Secondary | ICD-10-CM | POA: Diagnosis not present

## 2020-12-03 DIAGNOSIS — M4807 Spinal stenosis, lumbosacral region: Secondary | ICD-10-CM

## 2020-12-03 DIAGNOSIS — R292 Abnormal reflex: Secondary | ICD-10-CM | POA: Insufficient documentation

## 2020-12-03 DIAGNOSIS — G629 Polyneuropathy, unspecified: Secondary | ICD-10-CM

## 2020-12-03 DIAGNOSIS — M5126 Other intervertebral disc displacement, lumbar region: Secondary | ICD-10-CM | POA: Diagnosis not present

## 2020-12-11 DIAGNOSIS — M9905 Segmental and somatic dysfunction of pelvic region: Secondary | ICD-10-CM | POA: Diagnosis not present

## 2020-12-11 DIAGNOSIS — M5136 Other intervertebral disc degeneration, lumbar region: Secondary | ICD-10-CM | POA: Diagnosis not present

## 2020-12-11 DIAGNOSIS — M9902 Segmental and somatic dysfunction of thoracic region: Secondary | ICD-10-CM | POA: Diagnosis not present

## 2020-12-11 DIAGNOSIS — M9903 Segmental and somatic dysfunction of lumbar region: Secondary | ICD-10-CM | POA: Diagnosis not present

## 2020-12-14 DIAGNOSIS — M5136 Other intervertebral disc degeneration, lumbar region: Secondary | ICD-10-CM | POA: Diagnosis not present

## 2020-12-14 DIAGNOSIS — M9903 Segmental and somatic dysfunction of lumbar region: Secondary | ICD-10-CM | POA: Diagnosis not present

## 2020-12-14 DIAGNOSIS — M9902 Segmental and somatic dysfunction of thoracic region: Secondary | ICD-10-CM | POA: Diagnosis not present

## 2020-12-14 DIAGNOSIS — M9905 Segmental and somatic dysfunction of pelvic region: Secondary | ICD-10-CM | POA: Diagnosis not present

## 2020-12-15 ENCOUNTER — Ambulatory Visit (HOSPITAL_COMMUNITY): Payer: Medicare HMO

## 2020-12-15 ENCOUNTER — Other Ambulatory Visit (HOSPITAL_COMMUNITY): Payer: Medicare HMO

## 2020-12-16 DIAGNOSIS — H353132 Nonexudative age-related macular degeneration, bilateral, intermediate dry stage: Secondary | ICD-10-CM | POA: Diagnosis not present

## 2020-12-17 DIAGNOSIS — L03031 Cellulitis of right toe: Secondary | ICD-10-CM | POA: Diagnosis not present

## 2020-12-17 DIAGNOSIS — M79674 Pain in right toe(s): Secondary | ICD-10-CM | POA: Diagnosis not present

## 2020-12-17 DIAGNOSIS — M79671 Pain in right foot: Secondary | ICD-10-CM | POA: Diagnosis not present

## 2020-12-17 DIAGNOSIS — L600 Ingrowing nail: Secondary | ICD-10-CM | POA: Diagnosis not present

## 2020-12-18 DIAGNOSIS — M9902 Segmental and somatic dysfunction of thoracic region: Secondary | ICD-10-CM | POA: Diagnosis not present

## 2020-12-18 DIAGNOSIS — M9903 Segmental and somatic dysfunction of lumbar region: Secondary | ICD-10-CM | POA: Diagnosis not present

## 2020-12-18 DIAGNOSIS — M9905 Segmental and somatic dysfunction of pelvic region: Secondary | ICD-10-CM | POA: Diagnosis not present

## 2020-12-18 DIAGNOSIS — M5136 Other intervertebral disc degeneration, lumbar region: Secondary | ICD-10-CM | POA: Diagnosis not present

## 2020-12-22 DIAGNOSIS — M5136 Other intervertebral disc degeneration, lumbar region: Secondary | ICD-10-CM | POA: Diagnosis not present

## 2020-12-22 DIAGNOSIS — M9902 Segmental and somatic dysfunction of thoracic region: Secondary | ICD-10-CM | POA: Diagnosis not present

## 2020-12-22 DIAGNOSIS — M9905 Segmental and somatic dysfunction of pelvic region: Secondary | ICD-10-CM | POA: Diagnosis not present

## 2020-12-22 DIAGNOSIS — M9903 Segmental and somatic dysfunction of lumbar region: Secondary | ICD-10-CM | POA: Diagnosis not present

## 2020-12-28 DIAGNOSIS — M9903 Segmental and somatic dysfunction of lumbar region: Secondary | ICD-10-CM | POA: Diagnosis not present

## 2020-12-28 DIAGNOSIS — M9902 Segmental and somatic dysfunction of thoracic region: Secondary | ICD-10-CM | POA: Diagnosis not present

## 2020-12-28 DIAGNOSIS — M5136 Other intervertebral disc degeneration, lumbar region: Secondary | ICD-10-CM | POA: Diagnosis not present

## 2020-12-28 DIAGNOSIS — M9905 Segmental and somatic dysfunction of pelvic region: Secondary | ICD-10-CM | POA: Diagnosis not present

## 2020-12-30 DIAGNOSIS — M9903 Segmental and somatic dysfunction of lumbar region: Secondary | ICD-10-CM | POA: Diagnosis not present

## 2020-12-30 DIAGNOSIS — M5136 Other intervertebral disc degeneration, lumbar region: Secondary | ICD-10-CM | POA: Diagnosis not present

## 2020-12-30 DIAGNOSIS — M9902 Segmental and somatic dysfunction of thoracic region: Secondary | ICD-10-CM | POA: Diagnosis not present

## 2020-12-30 DIAGNOSIS — M9905 Segmental and somatic dysfunction of pelvic region: Secondary | ICD-10-CM | POA: Diagnosis not present

## 2020-12-31 DIAGNOSIS — M79674 Pain in right toe(s): Secondary | ICD-10-CM | POA: Diagnosis not present

## 2020-12-31 DIAGNOSIS — L600 Ingrowing nail: Secondary | ICD-10-CM | POA: Diagnosis not present

## 2020-12-31 DIAGNOSIS — L03031 Cellulitis of right toe: Secondary | ICD-10-CM | POA: Diagnosis not present

## 2020-12-31 DIAGNOSIS — M79671 Pain in right foot: Secondary | ICD-10-CM | POA: Diagnosis not present

## 2021-01-03 ENCOUNTER — Encounter: Payer: Self-pay | Admitting: Cardiology

## 2021-01-04 MED ORDER — METOPROLOL SUCCINATE ER 25 MG PO TB24: ORAL_TABLET | ORAL | 3 refills | Status: DC

## 2021-01-13 DIAGNOSIS — M9905 Segmental and somatic dysfunction of pelvic region: Secondary | ICD-10-CM | POA: Diagnosis not present

## 2021-01-13 DIAGNOSIS — M9903 Segmental and somatic dysfunction of lumbar region: Secondary | ICD-10-CM | POA: Diagnosis not present

## 2021-01-13 DIAGNOSIS — M546 Pain in thoracic spine: Secondary | ICD-10-CM | POA: Diagnosis not present

## 2021-01-13 DIAGNOSIS — M5136 Other intervertebral disc degeneration, lumbar region: Secondary | ICD-10-CM | POA: Diagnosis not present

## 2021-01-13 DIAGNOSIS — M9902 Segmental and somatic dysfunction of thoracic region: Secondary | ICD-10-CM | POA: Diagnosis not present

## 2021-01-18 DIAGNOSIS — M79674 Pain in right toe(s): Secondary | ICD-10-CM | POA: Diagnosis not present

## 2021-01-18 DIAGNOSIS — L600 Ingrowing nail: Secondary | ICD-10-CM | POA: Diagnosis not present

## 2021-01-18 DIAGNOSIS — M79671 Pain in right foot: Secondary | ICD-10-CM | POA: Diagnosis not present

## 2021-01-27 DIAGNOSIS — M5136 Other intervertebral disc degeneration, lumbar region: Secondary | ICD-10-CM | POA: Diagnosis not present

## 2021-01-27 DIAGNOSIS — M9902 Segmental and somatic dysfunction of thoracic region: Secondary | ICD-10-CM | POA: Diagnosis not present

## 2021-01-27 DIAGNOSIS — M9905 Segmental and somatic dysfunction of pelvic region: Secondary | ICD-10-CM | POA: Diagnosis not present

## 2021-01-27 DIAGNOSIS — M9903 Segmental and somatic dysfunction of lumbar region: Secondary | ICD-10-CM | POA: Diagnosis not present

## 2021-01-31 ENCOUNTER — Other Ambulatory Visit: Payer: Self-pay | Admitting: Nurse Practitioner

## 2021-02-02 MED ORDER — FLUTICASONE PROPIONATE 50 MCG/ACT NA SUSP: 1.0000 | NASAL | 1 refills | Status: DC | PRN

## 2021-02-03 DIAGNOSIS — M5136 Other intervertebral disc degeneration, lumbar region: Secondary | ICD-10-CM | POA: Diagnosis not present

## 2021-02-03 DIAGNOSIS — M9902 Segmental and somatic dysfunction of thoracic region: Secondary | ICD-10-CM | POA: Diagnosis not present

## 2021-02-03 DIAGNOSIS — M9903 Segmental and somatic dysfunction of lumbar region: Secondary | ICD-10-CM | POA: Diagnosis not present

## 2021-02-03 DIAGNOSIS — M9905 Segmental and somatic dysfunction of pelvic region: Secondary | ICD-10-CM | POA: Diagnosis not present

## 2021-02-10 DIAGNOSIS — M9902 Segmental and somatic dysfunction of thoracic region: Secondary | ICD-10-CM | POA: Diagnosis not present

## 2021-02-10 DIAGNOSIS — M9905 Segmental and somatic dysfunction of pelvic region: Secondary | ICD-10-CM | POA: Diagnosis not present

## 2021-02-10 DIAGNOSIS — M5136 Other intervertebral disc degeneration, lumbar region: Secondary | ICD-10-CM | POA: Diagnosis not present

## 2021-02-10 DIAGNOSIS — M9903 Segmental and somatic dysfunction of lumbar region: Secondary | ICD-10-CM | POA: Diagnosis not present

## 2021-02-18 ENCOUNTER — Encounter: Payer: Self-pay | Admitting: Nurse Practitioner

## 2021-02-18 ENCOUNTER — Ambulatory Visit: Payer: Medicare HMO | Admitting: Student

## 2021-02-18 ENCOUNTER — Other Ambulatory Visit: Payer: Self-pay

## 2021-02-18 ENCOUNTER — Encounter: Payer: Self-pay | Admitting: Student

## 2021-02-18 VITALS — BP 124/74 | HR 73 | Ht 65.0 in | Wt 131.8 lb

## 2021-02-18 DIAGNOSIS — R002 Palpitations: Secondary | ICD-10-CM | POA: Diagnosis not present

## 2021-02-18 DIAGNOSIS — R5383 Other fatigue: Secondary | ICD-10-CM | POA: Diagnosis not present

## 2021-02-18 DIAGNOSIS — R079 Chest pain, unspecified: Secondary | ICD-10-CM

## 2021-02-18 DIAGNOSIS — G629 Polyneuropathy, unspecified: Secondary | ICD-10-CM

## 2021-02-18 DIAGNOSIS — R2681 Unsteadiness on feet: Secondary | ICD-10-CM

## 2021-02-18 MED ORDER — GABAPENTIN 100 MG PO CAPS: 100.0000 mg | ORAL_CAPSULE | Freq: Two times a day (BID) | ORAL | 1 refills | Status: DC

## 2021-02-18 MED ORDER — METOPROLOL SUCCINATE ER 25 MG PO TB24: 25.0000 mg | ORAL_TABLET | Freq: Every day | ORAL | 3 refills | Status: DC

## 2021-02-18 NOTE — Progress Notes (Signed)
Cardiology Office Note    Date:  02/18/2021   ID:  NDEYE TENORIO, DOB [birth year], MRN [id number]  PCP:  [name], NP  Cardiologist: Rozann Lesches, MD    Chief Complaint  Patient presents with   Follow-up    Fatigue, balance issues and recent chest pain    History of Present Illness:    Nancy Williams is a 81 y.o. female with past medical history of hypothyroidism, osteoarthritis and palpitations (s/p monitor in 05/2019 showing brief SVT and 4% PVC burden) who presents to the office today for evaluation of fatigue and chest pain.  She was last examined by Dr. Domenic Polite in 05/2019 as a new patient referral for palpitations. A 72-hour Zio patch was recommended for further evaluation along with an echocardiogram. Her echocardiogram showed a preserved EF of 60 to 65% with no regional wall motion abnormalities. She had trivial MR but no significant valve abnormalities.  Her Holter monitor showed predominantly normal sinus rhythm with an average heart rate of 70 bpm and she did have approximately a 4% PVC burden and brief episodes of SVT with the longest lasting only 6 beats and no sustained arrhythmias or pauses. She was ultimately started on Toprol-XL 12.5 mg daily.  In talking with the patient and her daughter today, she reports having a 2+ year history of progressive fatigue and little energy to do routine activities she previously enjoyed. She is unaware of any specific dyspnea on exertion, orthopnea or PND. Reports an occasional "jitteriness" and has taken an extra half of her Toprol-XL but is unsure if this helped with symptoms. No recent exertional chest pain but does report having an occasional shooting pain underneath her left breast which can occur at rest or with activity. She does feel off balance at times and has worked with PT and was previously evaluated by a Restaurant manager, fast food and diagnosed with vestibular disease. Also underwent work-up by Neurology which was unrevealing per  their report.  She is unaware of any personal history of CAD or CHF. Reports a strong family history of both and says her son was recently was diagnosed with CAD.   Past Medical History:  Diagnosis Date   Hypothyroidism    Hashimoto's per [facility] New Patient Packet    Neuropathy    Osteoarthritis    Per patient at New patient appointment    Seasonal allergies    Vitamin D deficiency     Past Surgical History:  Procedure Laterality Date   No previous surgery      Current Medications: Outpatient Medications Prior to Visit  Medication Sig Dispense Refill   Ascorbic Acid (VITAMIN C) 1000 MG tablet Take 1,000 mg by mouth daily.     b complex vitamins tablet Take 1 tablet by mouth daily.     Calcium Carb-Cholecalciferol (CALCIUM 500+D3 PO) Take 1 capsule by mouth daily. 200 units of vit D     Cholecalciferol (VITAMIN D3) 25 MCG (1000 UT) CAPS Take 1 capsule by mouth daily.     Cyanocobalamin (B-12) 5000 MCG CAPS Take 1 capsule by mouth daily.     fluticasone (FLONASE) 50 MCG/ACT nasal spray Place 1 spray into both nostrils as needed for allergies or rhinitis. 16 g 1   gabapentin (NEURONTIN) 100 MG capsule Take 1 capsule (100 mg total) by mouth 2 (two) times daily. 180 capsule 1   levothyroxine (SYNTHROID) 75 MCG tablet TAKE 1 TABLET ONCE DAILY 30MINUTES BEFORE BREAKFAST ONAN EMPTY STOMACH 90 tablet 1  Omega-3 Fatty Acids (OMEGA-3 PO) Take 1 tablet by mouth daily.     metoprolol succinate (TOPROL-XL) 25 MG 24 hr tablet Take 1/2 (one-half) tablet by mouth once daily 45 tablet 3   No facility-administered medications prior to visit.     Allergies:   Patient has no known allergies.   Social History   Socioeconomic History   Marital status: Widowed    Spouse name: Not on file   Number of children: Not on file   Years of education: Not on file   Highest education level: Not on file  Occupational History   Not on file  Tobacco Use   Smoking status: Never   Smokeless tobacco: Never   Vaping Use   Vaping Use: Never used  Substance and Sexual Activity   Alcohol use: Not Currently    Comment: Occasional, last drink 1-2 years ago as of 2021    Drug use: Never   Sexual activity: Not on file  Other Topics Concern   Not on file  Social History Narrative   Per [facility] New Patient Packet Abstracted on 08/19/2019:      Diet: Vegetarian, gluten free       Caffeine: Coffee & Tea      Married, if yes what year: Widow, 1959      Do you live in a house, apartment, assisted living, condo, trailer, ect: House      Is it one or more stories: One stories, one person       Pets: 4 cats       Current/Past profession: Left Blank      Highest level or education completed: 12 th grade       Exercise:        Try          Type and how often: Need to clarify, unable to read what patient wrote          Living Will: Yes   DNR: Yes   POA/HPOA: Yes      Functional Status:   Do you have difficulty bathing or dressing yourself? No   Do you have difficulty preparing food or eating?No   Do you have difficulty managing your medications? No   Do you have difficulty managing your finances? No   Do you have difficulty affording your medications? No         Right Handed    Lives in a one story home    Social Determinants of Health   Financial Resource Strain: Not on file  Food Insecurity: Not on file  Transportation Needs: Not on file  Physical Activity: Not on file  Stress: Not on file  Social Connections: Not on file     Family History:  The patient's family history includes Diabetes in her daughter; Heart disease in her daughter, father, and mother; Heart failure in her mother; Neurologic Disorder in her daughter; Prostate cancer in her father.   Review of Systems:    Please see the history of present illness.     All other systems reviewed and are otherwise negative except as noted above.   Physical Exam:    VS:  BP 124/74    Pulse 73    Ht 5\' 5"  (1.651 m)    Wt 131  lb 12.8 oz (59.8 kg)    SpO2 98%    BMI 21.93 kg/m    General: Pleasant female appearing in no acute distress. Head: Normocephalic, atraumatic. Neck: No carotid bruits. JVD not  elevated.  Lungs: Respirations regular and unlabored, without wheezes or rales.  Heart: Regular rate and rhythm. No S3 or S4.  No murmur, no rubs, or gallops appreciated. Abdomen: Appears non-distended. No obvious abdominal masses. Msk:  Strength and tone appear normal for age. No obvious joint deformities or effusions. Extremities: No clubbing or cyanosis. No pitting edema.  Distal pedal pulses are 2+ bilaterally. Neuro: Alert and oriented X 3. Moves all extremities spontaneously. No focal deficits noted. Psych:  Responds to questions appropriately with a normal affect. Skin: No rashes or lesions noted  Wt Readings from Last 3 Encounters:  02/18/21 131 lb 12.8 oz (59.8 kg)  11/18/20 130 lb (59 kg)  11/16/20 131 lb (59.4 kg)      Studies/Labs Reviewed:   EKG:  EKG is ordered today.  The ekg ordered today demonstrates NSR, HR 64 with no acute ST changes.   Recent Labs: 11/16/2020: ALT 16; BUN 27; Creat 0.80; Hemoglobin 13.7; Platelets 247; Potassium 4.5; Sodium 139; TSH 0.83   Lipid Panel    Component Value Date/Time   CHOL 235 (H) 05/20/2020 0943   TRIG 86 05/20/2020 0943   HDL 96 05/20/2020 0943   CHOLHDL 2.4 05/20/2020 0943   LDLCALC 120 (H) 05/20/2020 0943    Additional studies/ records that were reviewed today include:   Echocardiogram: 05/2019 IMPRESSIONS     1. Left ventricular ejection fraction, by estimation, is 60 to 65%. The  left ventricle has normal function. The left ventricle has no regional  wall motion abnormalities. Left ventricular diastolic parameters were  normal.   2. Right ventricular systolic function is normal. The right ventricular  size is normal. Tricuspid regurgitation signal is inadequate for assessing  PA pressure.   3. Left atrial size was upper normal.   4.  The mitral valve is grossly normal. Trivial mitral valve  regurgitation.   5. The aortic valve is tricuspid. Aortic valve regurgitation is not  visualized.   6. The inferior vena cava is normal in size with greater than 50%  respiratory variability, suggesting right atrial pressure of 3 mmHg.   Holter Monitor: 05/2019 ZIO XT reviewed.  2 days 19 hours analyzed.  Predominant rhythm is sinus with heart rate ranging from 47 bpm up to 120 bpm and average heart rate 70 bpm.  Rare PACs were noted representing less than 1% of total beats.  There were occasional PVCs as well representing approximately 4% of total beats.  Brief bursts of SVT noted, the longest was only 6 beats.  There were no sustained arrhythmias or pauses.  No specific ectopy corresponded to patient triggered event other than PVC.  Assessment:    1. Chest pain, unspecified type   2. Other fatigue   3. Palpitations   4. Gait instability      Plan:   In order of problems listed above:  1. Chest Pain with Atypical Features/Fatigue - Her recent chest pain seems atypical for angina as it would occur at rest or with activity and was a shooting pain with no recurrence since. However, she does report progressive fatigue over the past few years and particularly worse within the past few months. Recent labs by PCP unrevealing. Given her symptoms and strong family history of CAD, we reviewed options in regards to ischemic evaluation and will plan to arrange for a Coronary CT.   2. Palpitations - Her prior monitor in 05/2019 showed brief SVT and a 4% PVC burden. EKG today shows NSR. She has been  on Toprol-XL 12.5mg  daily and takes an extra half tablet at times and is unsure if this helps with her symptoms. We reviewed she could try increasing this to 25mg  daily for the next 1-2 weeks and if improvement in her symptoms, continue on the higher dose.   3. Gait Instability - This seems atypical for a cardiac etiology as symptoms are not worse  with positional changes and vitals have been stable when this occurs. Was previously evaluated by Neurology with no identifiable culprit. Was diagnosed with vestibular disease by PT in the past. Will plan for a Coronary CT as outlined above but I do not see a strong indication to repeat an echo or event monitor at this time.    Medication Adjustments/Labs and Tests Ordered: Current medicines are reviewed at length with the patient today.  Concerns regarding medicines are outlined above.  Medication changes, Labs and Tests ordered today are listed in the Patient Instructions below. Patient Instructions  Medication Instructions:   Increase Toprol to 25 mg Daily   *If you need a refill on your cardiac medications before your next appointment, please call your pharmacy*   Lab Work: Your physician recommends that you return for lab work in: 1 week prior to your CT scan   If you have labs (blood work) drawn today and your tests are completely normal, you will receive your results only by: [place] (if you have MyChart) OR A paper copy in the mail If you have any lab test that is abnormal or we need to change your treatment, we will call you to review the results.   Testing/Procedures: Coronary CT scan    Follow-Up: At [facility], you and your health needs are our priority.  As part of our continuing mission to provide you with exceptional heart care, we have created designated Provider Care Teams.  These Care Teams include your primary Cardiologist (physician) and Advanced Practice Providers (APPs -  Physician Assistants and Nurse Practitioners) who all work together to provide you with the care you need, when you need it.  We recommend signing up for the patient portal called "MyChart".  Sign up information is provided on this After Visit Summary.  MyChart is used to connect with patients for Virtual Visits (Telemedicine).  Patients are able to view lab/test results, encounter  notes, upcoming appointments, etc.  Non-urgent messages can be sent to your provider as well.   To learn more about what you can do with MyChart, go to [email or web address].    Your next appointment:    As Needed Pending CT scan results.   The format for your next appointment:   In Person  Provider:   Rozann Lesches, MD or Bernerd Pho, PA-C    Other Instructions Thank you for choosing [facility]!    Your cardiac CT will be scheduled at one of the below locations:   [facility] [address] [place], [place] [address] [phone]  [facility] [address] [place], [place] [address] [phone]  If scheduled at [facility], please arrive at the [facility] main entrance (entrance A) of [facility] 30 minutes prior to test start time. You can use the FREE valet parking offered at the main entrance (encouraged to control the heart rate for the test) Proceed to the [facility] Radiology Department (first floor) to check-in and test prep.  If scheduled at [facility], please arrive 35  mins early for check-in and test prep.  Please follow these instructions carefully (unless otherwise directed):  On the Night Before the Test: Be sure to Drink plenty of water. Do not consume any caffeinated/decaffeinated beverages or chocolate 12 hours prior to your test. Do not take any antihistamines 12 hours prior to your test.  On the Day of the Test: Drink plenty of water until 1 hour prior to the test. Do not eat any food 4 hours prior to the test. You may take your regular medications prior to the test.  Take metoprolol (Lopressor) two hours prior to test. HOLD Furosemide/Hydrochlorothiazide morning of the test. FEMALES- please wear underwire-free bra if available, avoid dresses & tight clothing   *For Clinical Staff only. Please instruct patient the  following:* Heart Rate Medication Recommendations for Cardiac CT  Resting HR < 50 bpm  No medication  Resting HR 50-60 bpm and BP >110/50 mmHG   Consider Metoprolol tartrate 25 mg PO 90-120 min prior to scan  Resting HR 60-65 bpm and BP >110/50 mmHG  Metoprolol tartrate 50 mg PO 90-120 minutes prior to scan   Resting HR > 65 bpm and BP >110/50 mmHG  Metoprolol tartrate 100 mg PO 90-120 minutes prior to scan  Consider Ivabradine 10-15 mg PO or a calcium channel blocker for resting HR >60 bpm and contraindication to metoprolol tartrate  Consider Ivabradine 10-15 mg PO in combination with metoprolol tartrate for HR >80 bpm         After the Test: Drink plenty of water. After receiving IV contrast, you may experience a mild flushed feeling. This is normal. On occasion, you may experience a mild rash up to 24 hours after the test. This is not dangerous. If this occurs, you can take Benadryl 25 mg and increase your fluid intake. If you experience trouble breathing, this can be serious. If it is severe call 911 IMMEDIATELY. If it is mild, please call our office. If you take any of these medications: Glipizide/Metformin, Avandament, Glucavance, please do not take 48 hours after completing test unless otherwise instructed.  We will call to schedule your test 2-4 weeks out understanding that some insurance companies will need an authorization prior to the service being performed.   For non-scheduling related questions, please contact the cardiac imaging nurse navigator should you have any questions/concerns: Marchia Bond, Cardiac Imaging Nurse Navigator Gordy Clement, Cardiac Imaging Nurse Navigator [facility] Direct Office Dial: [phone]   For scheduling needs, including cancellations and rescheduling, please call [place], [phone].     Signed, Erma Heritage, PA-C  02/18/2021 7:27 PM    [facility] [address]  [place], [place] [address] Phone: [phone] Fax: [phone]

## 2021-02-18 NOTE — Patient Instructions (Signed)
Medication Instructions:   Increase Toprol to 25 mg Daily   *If you need a refill on your cardiac medications before your next appointment, please call your pharmacy*   Lab Work: Your physician recommends that you return for lab work in: 1 week prior to your CT scan   If you have labs (blood work) drawn today and your tests are completely normal, you will receive your results only by: [place] (if you have MyChart) OR A paper copy in the mail If you have any lab test that is abnormal or we need to change your treatment, we will call you to review the results.   Testing/Procedures: Coronary CT scan    Follow-Up: At [facility], you and your health needs are our priority.  As part of our continuing mission to provide you with exceptional heart care, we have created designated Provider Care Teams.  These Care Teams include your primary Cardiologist (physician) and Advanced Practice Providers (APPs -  Physician Assistants and Nurse Practitioners) who all work together to provide you with the care you need, when you need it.  We recommend signing up for the patient portal called "MyChart".  Sign up information is provided on this After Visit Summary.  MyChart is used to connect with patients for Virtual Visits (Telemedicine).  Patients are able to view lab/test results, encounter notes, upcoming appointments, etc.  Non-urgent messages can be sent to your provider as well.   To learn more about what you can do with MyChart, go to [email or web address].    Your next appointment:    As Needed Pending CT scan results.   The format for your next appointment:   In Person  Provider:   Rozann Lesches, MD or Bernerd Pho, PA-C    Other Instructions Thank you for choosing [facility]!    Your cardiac CT will be scheduled at one of the below locations:   [facility] [address] [place], [place] [address] [phone]  [facility] [address] [place],  [address] [phone]  If scheduled at [facility], please arrive at the [facility] main entrance (entrance A) of [facility] 30 minutes prior to test start time. You can use the FREE valet parking offered at the main entrance (encouraged to control the heart rate for the test) Proceed to the [facility] Radiology Department (first floor) to check-in and test prep.  If scheduled at [facility], please arrive 15 mins early for check-in and test prep.  Please follow these instructions carefully (unless otherwise directed):  On the Night Before the Test: Be sure to Drink plenty of water. Do not consume any caffeinated/decaffeinated beverages or chocolate 12 hours prior to your test. Do not take any antihistamines 12 hours prior to your test.  On the Day of the Test: Drink plenty of water until 1 hour prior to the test. Do not eat any food 4 hours prior to the test. You may take your regular medications prior to the test.  Take metoprolol (Lopressor) two hours prior to test. HOLD Furosemide/Hydrochlorothiazide morning of the test. FEMALES- please wear underwire-free bra if available, avoid dresses & tight clothing   *For Clinical Staff only. Please instruct patient the following:* Heart Rate Medication Recommendations for Cardiac CT  Resting HR < 50 bpm  No medication  Resting HR 50-60 bpm and BP >110/50 mmHG   Consider Metoprolol tartrate 25 mg PO 90-120  min prior to scan  Resting HR 60-65 bpm and BP >110/50 mmHG  Metoprolol tartrate 50 mg PO 90-120 minutes prior to scan   Resting HR > 65 bpm and BP >110/50 mmHG  Metoprolol tartrate 100 mg PO 90-120 minutes prior to scan  Consider Ivabradine 10-15 mg PO or a calcium channel blocker for resting HR >60 bpm and contraindication to metoprolol tartrate  Consider Ivabradine 10-15 mg PO in combination with metoprolol  tartrate for HR >80 bpm         After the Test: Drink plenty of water. After receiving IV contrast, you may experience a mild flushed feeling. This is normal. On occasion, you may experience a mild rash up to 24 hours after the test. This is not dangerous. If this occurs, you can take Benadryl 25 mg and increase your fluid intake. If you experience trouble breathing, this can be serious. If it is severe call 911 IMMEDIATELY. If it is mild, please call our office. If you take any of these medications: Glipizide/Metformin, Avandament, Glucavance, please do not take 48 hours after completing test unless otherwise instructed.  We will call to schedule your test 2-4 weeks out understanding that some insurance companies will need an authorization prior to the service being performed.   For non-scheduling related questions, please contact the cardiac imaging nurse navigator should you have any questions/concerns: Marchia Bond, Cardiac Imaging Nurse Navigator Gordy Clement, Cardiac Imaging Nurse Navigator [facility] Direct Office Dial: [phone]   For scheduling needs, including cancellations and rescheduling, please call [place], [phone].

## 2021-02-18 NOTE — Telephone Encounter (Signed)
Gabapentin 100 mg #180-1 Last filled 10/25/222 by [name], NP.  No longer wants to use [facility]. Wants to use [facility].  Last OV was 11/16/20 Next OV 03/29/2021 No Tx agreement on file To Joelene Millin, NP

## 2021-02-19 ENCOUNTER — Telehealth: Payer: Self-pay | Admitting: Cardiology

## 2021-02-19 NOTE — Telephone Encounter (Signed)
Patient was seen yesterday, 01/19 and discussed CT order. Patient's daughter, Magda Paganini, is following up regarding order. She is requesting a CT W/O contrast, assuming this will be easier for the patient. She states if the CT is just to check for plaque then it should be fine to do W/O contast, unless [place], [place] has other concerns. Daughter would like to confirm whether or not PA agrees with this prior to scheduling. Please advise.

## 2021-02-19 NOTE — Telephone Encounter (Signed)
° ° °  Coronary CT's require the use of contrast so they can assess the amount of plaque and percent stenosis. A regular Chest CT without contrast just shows "coronary calcification" and cannot differentiate between a smaller plaque amount vs. significant stenosis.   Signed, Erma Heritage, PA-C 02/19/2021, 9:44 AM Pager: [phone]

## 2021-02-19 NOTE — Telephone Encounter (Signed)
Patient's daughter Magda Paganini informed and verbalized understanding of plan.

## 2021-02-25 ENCOUNTER — Other Ambulatory Visit: Payer: Self-pay

## 2021-02-25 ENCOUNTER — Other Ambulatory Visit (HOSPITAL_COMMUNITY)
Admission: RE | Admit: 2021-02-25 | Discharge: 2021-02-25 | Disposition: A | Discharge status: Home / Self Care | Payer: Medicare HMO | Source: Ambulatory Visit | Attending: Student | Admitting: Student

## 2021-02-25 DIAGNOSIS — R5383 Other fatigue: Secondary | ICD-10-CM | POA: Insufficient documentation

## 2021-02-25 DIAGNOSIS — R079 Chest pain, unspecified: Secondary | ICD-10-CM | POA: Insufficient documentation

## 2021-02-25 LAB — BASIC METABOLIC PANEL
Anion gap: 5 (ref 5–15)
BUN: 18 mg/dL (ref 8–23)
CO2: 27 mmol/L (ref 22–32)
Calcium: 9.2 mg/dL (ref 8.9–10.3)
Chloride: 103 mmol/L (ref 98–111)
Creatinine, Ser: 0.75 mg/dL (ref 0.44–1.00)
GFR, Estimated: >60 mL/min (ref >60)
Glucose, Bld: 88 mg/dL (ref 70–99)
Potassium: 3.8 mmol/L (ref 3.5–5.1)
Sodium: 135 mmol/L (ref 135–145)

## 2021-03-01 ENCOUNTER — Telehealth (HOSPITAL_COMMUNITY): Payer: Self-pay | Admitting: Emergency Medicine

## 2021-03-01 NOTE — Telephone Encounter (Signed)
Attempted to call patient regarding upcoming cardiac CT appointment. °Left message on voicemail with name and callback number °Waldron Gerry RN Navigator Cardiac Imaging °[facility] °[phone] Office °[phone] Cell ° °

## 2021-03-01 NOTE — Telephone Encounter (Signed)
Reaching out to patient to offer assistance regarding upcoming cardiac imaging study; pt verbalizes understanding of appt date/time, parking situation and where to check in, pre-test NPO status and medications ordered, and verified current allergies; name and call back number provided for further questions should they arise Marchia Bond RN Navigator Cardiac Imaging [facility] [phone] office [phone] cell  Denies iv issues Taking metop succ as prescribed (HS) , taking additional dose 2 hr prior to scan Arrival 400

## 2021-03-03 ENCOUNTER — Ambulatory Visit (HOSPITAL_COMMUNITY)
Admission: RE | Admit: 2021-03-03 | Discharge: 2021-03-03 | Disposition: A | Discharge status: Home / Self Care | Payer: Medicare HMO | Source: Ambulatory Visit | Attending: Student | Admitting: Student

## 2021-03-03 ENCOUNTER — Other Ambulatory Visit: Payer: Self-pay

## 2021-03-03 DIAGNOSIS — R079 Chest pain, unspecified: Secondary | ICD-10-CM | POA: Diagnosis present

## 2021-03-03 DIAGNOSIS — I2510 Atherosclerotic heart disease of native coronary artery without angina pectoris: Secondary | ICD-10-CM | POA: Insufficient documentation

## 2021-03-03 MED ORDER — NITROGLYCERIN 0.4 MG SL SUBL
SUBLINGUAL_TABLET | SUBLINGUAL | Status: AC
  Filled 2021-03-03: qty 2

## 2021-03-03 MED ORDER — METOPROLOL TARTRATE 5 MG/5ML IV SOLN: 5.0000 mg | INTRAVENOUS | Status: DC | PRN

## 2021-03-03 MED ORDER — METOPROLOL TARTRATE 5 MG/5ML IV SOLN
INTRAVENOUS | Status: AC
  Administered 2021-03-03: 5 mg via INTRAVENOUS
  Filled 2021-03-03: qty 10

## 2021-03-03 MED ORDER — NITROGLYCERIN 0.4 MG SL SUBL
0.8000 mg | SUBLINGUAL_TABLET | Freq: Once | SUBLINGUAL | Status: AC
  Administered 2021-03-03: 0.8 mg via SUBLINGUAL

## 2021-03-03 MED ORDER — IOHEXOL 350 MG/ML SOLN
95.0000 mL | Freq: Once | INTRAVENOUS | Status: AC | PRN
  Administered 2021-03-03: 95 mL via INTRAVENOUS

## 2021-03-29 ENCOUNTER — Encounter: Payer: Self-pay | Admitting: Nurse Practitioner

## 2021-03-29 ENCOUNTER — Other Ambulatory Visit: Payer: Self-pay

## 2021-03-29 ENCOUNTER — Ambulatory Visit (INDEPENDENT_AMBULATORY_CARE_PROVIDER_SITE_OTHER): Payer: Medicare HMO | Admitting: Nurse Practitioner

## 2021-03-29 VITALS — BP 118/80 | HR 63 | Temp 97.3°F | Ht 65.0 in | Wt 132.0 lb

## 2021-03-29 DIAGNOSIS — M8580 Other specified disorders of bone density and structure, unspecified site: Secondary | ICD-10-CM

## 2021-03-29 DIAGNOSIS — E038 Other specified hypothyroidism: Secondary | ICD-10-CM

## 2021-03-29 DIAGNOSIS — E782 Mixed hyperlipidemia: Secondary | ICD-10-CM | POA: Diagnosis not present

## 2021-03-29 DIAGNOSIS — R002 Palpitations: Secondary | ICD-10-CM

## 2021-03-29 DIAGNOSIS — R2689 Other abnormalities of gait and mobility: Secondary | ICD-10-CM

## 2021-03-29 DIAGNOSIS — E063 Autoimmune thyroiditis: Secondary | ICD-10-CM | POA: Diagnosis not present

## 2021-03-29 NOTE — Progress Notes (Signed)
Careteam: Patient Care Team: [name], NP as PCP - General (Geriatric Medicine) [name], MD as PCP - Cardiology (Cardiology) [name], MD as Referring Physician (Dermatology) [name], DO (Optometry) [name], MD as Consulting Physician (Otolaryngology) [name], DO as Consulting Physician (Neurology)  PLACE OF SERVICE:  [place] Directive information Does Patient Have a Medical Advance Directive?: Yes, Type of Advance Directive: Living will, Does patient want to make changes to medical advance directive?: No - Patient declined  No Known Allergies  Chief Complaint  Patient presents with   Medical Management of Chronic Issues    4 month follow-up. Discuss need for additional covid boosters and Pneumonia vaccine or postpone if patient refuses. Patient would like to discuss ongoing issues such as balance. Here with daughter Nancy Williams      HPI: Patient is a 81 y.o. female for routine follow up.   Continues to have balance issue. Went to chiropractor which helped. Neurology exam did not find any reason for balance issue. Doing more exercises.   Reports she feels weak a lot of the time before she eats sometimes after she eats.  She took her blood sugars from her sons meter.  Blood sugars fasting 84-106 Food does not have the same taste for her.  Reports she is only getting about 50 gm of protein a day.  Does not eat meat so it is hard to get protein. She has been vegan for 15 years.   Constipation noted but managed with herbal tea  Review of Systems:  Review of Systems  Constitutional:  Negative for chills, fever and weight loss.  HENT:  Negative for tinnitus.   Respiratory:  Negative for cough, sputum production and shortness of breath.   Cardiovascular:  Negative for chest pain, palpitations and leg swelling.  Gastrointestinal:  Negative for abdominal pain, constipation, diarrhea and heartburn.  Genitourinary:  Negative  for dysuria, frequency and urgency.  Musculoskeletal:  Negative for back pain, falls, joint pain and myalgias.  Skin: Negative.   Neurological:  Positive for weakness. Negative for dizziness and headaches.  Psychiatric/Behavioral:  Negative for depression and memory loss. The patient does not have insomnia.    Past Medical History:  Diagnosis Date   Hypothyroidism    Hashimoto's per [facility] New Patient Packet    Neuropathy    Osteoarthritis    Per patient at New patient appointment    Seasonal allergies    Vitamin D deficiency    Past Surgical History:  Procedure Laterality Date   No previous surgery     Social History:   reports that she has never smoked. She has never used smokeless tobacco. She reports current alcohol use. She reports that she does not use drugs.  Family History  Problem Relation Age of Onset   Heart disease Mother    Heart failure Mother    Heart disease Father    Prostate cancer Father    Heart disease Daughter    Diabetes Daughter    Neurologic Disorder Daughter     Medications: Patient's Medications  New Prescriptions   No medications on file  Previous Medications   ASCORBIC ACID (VITAMIN C) 1000 MG TABLET    Take 1,000 mg by mouth daily.   B COMPLEX VITAMINS TABLET    Take 1 tablet by mouth daily.   CALCIUM CARB-CHOLECALCIFEROL (CALCIUM 500+D3 PO)    Take 1 capsule by mouth daily. 200 units of vit D   CHOLECALCIFEROL (  VITAMIN D3) 25 MCG (1000 UT) CAPS    Take 1 capsule by mouth daily.   CYANOCOBALAMIN (B-12) 5000 MCG CAPS    Take 1 capsule by mouth daily.   FLUTICASONE (FLONASE) 50 MCG/ACT NASAL SPRAY    Place 1 spray into both nostrils as needed for allergies or rhinitis.   GABAPENTIN (NEURONTIN) 100 MG CAPSULE    Take 1 capsule (100 mg total) by mouth 2 (two) times daily.   LEVOTHYROXINE (SYNTHROID) 75 MCG TABLET    TAKE 1 TABLET ONCE DAILY 30MINUTES BEFORE BREAKFAST ONAN EMPTY STOMACH   METOPROLOL SUCCINATE (TOPROL-XL) 25 MG 24 HR TABLET    Take  1 tablet (25 mg total) by mouth daily.   OMEGA-3 FATTY ACIDS (OMEGA-3 PO)    Take 1 tablet by mouth daily.  Modified Medications   No medications on file  Discontinued Medications   No medications on file    Physical Exam:  Vitals:   03/29/21 0920  BP: 118/80  Pulse: 63  Temp: (!) 97.3 F (36.3 C)  TempSrc: Temporal  SpO2: 97%  Weight: 132 lb (59.9 kg)  Height: _0  (1.651 m)   Body mass index is 21.97 kg/m. Wt Readings from Last 3 Encounters:  03/29/21 132 lb (59.9 kg)  02/18/21 131 lb 12.8 oz (59.8 kg)  11/18/20 130 lb (59 kg)    Physical Exam Constitutional:      General: She is not in acute distress.    Appearance: She is well-developed. She is not diaphoretic.  HENT:     Head: Normocephalic and atraumatic.     Mouth/Throat:     Pharynx: No oropharyngeal exudate.  Eyes:     Conjunctiva/sclera: Conjunctivae normal.     Pupils: Pupils are equal, round, and reactive to light.  Cardiovascular:     Rate and Rhythm: Normal rate and regular rhythm.     Heart sounds: Normal heart sounds.  Pulmonary:     Effort: Pulmonary effort is normal.     Breath sounds: Normal breath sounds.  Abdominal:     General: Bowel sounds are normal.     Palpations: Abdomen is soft.  Musculoskeletal:     Cervical back: Normal range of motion and neck supple.     Right lower leg: No edema.     Left lower leg: No edema.  Skin:    General: Skin is warm and dry.  Neurological:     Mental Status: She is alert.  Psychiatric:        Mood and Affect: Mood normal.    Labs reviewed: Basic Metabolic Panel: Recent Labs    05/20/20 0943 11/16/20 1533 02/25/21 1339  NA 139 139 135  K 4.5 4.5 3.8  CL 103 104 103  CO2 _1 GLUCOSE 84 80 88  BUN 17 27* 18  CREATININE 0.80 0.80 0.75  CALCIUM 9.7 10.0 9.2  TSH 2.42 0.83  --    Liver Function Tests: Recent Labs    05/20/20 0943 11/16/20 1533  AST 23 27  ALT 14 16  BILITOT 0.4 0.3  PROT 7.1 7.3   No results for input(s):  LIPASE, AMYLASE in the last 8760 hours. No results for input(s): AMMONIA in the last 8760 hours. CBC: Recent Labs    05/20/20 0943 11/16/20 1533  WBC 8.7 9.1  NEUTROABS 5,107 5,005  HGB 14.2 13.7  HCT 43.1 42.3  MCV 90.9 92.2  PLT 269 247   Lipid Panel: Recent Labs    05/20/20  0943  CHOL 235*  HDL 96  LDLCALC 120*  TRIG 86  CHOLHDL 2.4   TSH: Recent Labs    05/20/20 0943 11/16/20 1533  TSH 2.42 0.83   A1C: Lab Results  Component Value Date   HGBA1C 5.4 05/31/2019     Assessment/Plan 1. Osteopenia, unspecified location -Recommended to take calcium 600 mg twice daily with Vitamin D 2000 units daily and weight bearing activity 30 mins/5 days a week - CBC with Differential/Platelet - CMP with eGFR([facility])  2. Hypothyroidism due to Hashimoto's thyroiditis -TSH at goal on last lab, continue current regimen  3. Imbalance To continue to work on protein intake with core strengthening exercises.  4. Mixed hyperlipidemia -continue dietary modifcations - CBC with Differential/Platelet - CMP with eGFR([facility]) - Lipid panel  5. Palpitations Stable and improved on metoprolol.   Return in about 4 months (around 07/27/2021) for routine follow up . Carlos American. [place], [facility] [phone]

## 2021-03-30 LAB — COMPLETE METABOLIC PANEL WITH GFR
AG Ratio: 1.7 (calc) (ref 1.0–2.5)
ALT: 14 U/L (ref 6–29)
AST: 21 U/L (ref 10–35)
Albumin: 4.4 g/dL (ref 3.6–5.1)
Alkaline phosphatase (APISO): 65 U/L (ref 37–153)
BUN: 19 mg/dL (ref 7–25)
CO2: 24 mmol/L (ref 20–32)
Calcium: 9.3 mg/dL (ref 8.6–10.4)
Chloride: 104 mmol/L (ref 98–110)
Creat: 0.77 mg/dL (ref 0.60–0.95)
Globulin: 2.6 g/dL (calc) (ref 1.9–3.7)
Glucose, Bld: 68 mg/dL (ref 65–139)
Potassium: 4.8 mmol/L (ref 3.5–5.3)
Sodium: 139 mmol/L (ref 135–146)
Total Bilirubin: 0.3 mg/dL (ref 0.2–1.2)
Total Protein: 7 g/dL (ref 6.1–8.1)
eGFR: 77 mL/min/{1.73_m2} (ref >=60)

## 2021-03-30 LAB — CBC WITH DIFFERENTIAL/PLATELET
Absolute Monocytes: 867 cells/uL (ref 200–950)
Basophils Absolute: 57 cells/uL (ref 0–200)
Basophils Relative: 0.7 %
Eosinophils Absolute: 235 cells/uL (ref 15–500)
Eosinophils Relative: 2.9 %
HCT: 39.9 % (ref 35.0–45.0)
Hemoglobin: 13.3 g/dL (ref 11.7–15.5)
Lymphs Abs: 3175 cells/uL (ref 850–3900)
MCH: 30.1 pg (ref 27.0–33.0)
MCHC: 33.3 g/dL (ref 32.0–36.0)
MCV: 90.3 fL (ref 80.0–100.0)
MPV: 11.2 fL (ref 7.5–12.5)
Monocytes Relative: 10.7 %
Neutro Abs: 3767 cells/uL (ref 1500–7800)
Neutrophils Relative %: 46.5 %
Platelets: 250 10*3/uL (ref 140–400)
RBC: 4.42 10*6/uL (ref 3.80–5.10)
RDW: 12.8 % (ref 11.0–15.0)
Total Lymphocyte: 39.2 %
WBC: 8.1 10*3/uL (ref 3.8–10.8)

## 2021-03-30 LAB — LIPID PANEL
Cholesterol: 215 mg/dL — ABNORMAL HIGH (ref <200)
HDL: 94 mg/dL (ref >=50)
LDL Cholesterol (Calc): 98 mg/dL (calc)
Non-HDL Cholesterol (Calc): 121 mg/dL (calc) (ref <130)
Total CHOL/HDL Ratio: 2.3 (calc) (ref <5.0)
Triglycerides: 130 mg/dL (ref <150)

## 2021-04-06 ENCOUNTER — Encounter: Payer: Self-pay | Admitting: Nurse Practitioner

## 2021-04-06 DIAGNOSIS — Z8349 Family history of other endocrine, nutritional and metabolic diseases: Secondary | ICD-10-CM

## 2021-04-08 ENCOUNTER — Other Ambulatory Visit: Payer: Self-pay | Admitting: Nurse Practitioner

## 2021-04-08 ENCOUNTER — Telehealth: Payer: Self-pay

## 2021-04-08 DIAGNOSIS — Z8349 Family history of other endocrine, nutritional and metabolic diseases: Secondary | ICD-10-CM

## 2021-04-08 DIAGNOSIS — Z7689 Persons encountering health services in other specified circumstances: Secondary | ICD-10-CM | POA: Diagnosis not present

## 2021-04-08 NOTE — Telephone Encounter (Signed)
I have chosen a different lab test MTHFR DNA analysis, can fax to lab. ?

## 2021-04-08 NOTE — Telephone Encounter (Signed)
Daughter,Leslie called stating that lab test was the incorrect test.  Patient requested MTHFR and had homocysteine done which was not the correct. Please place order for correct test for [facility] in [place]. Please advise. ? ?Message routed to Sherrie Mustache, NP ?

## 2021-04-09 ENCOUNTER — Other Ambulatory Visit: Payer: Self-pay | Admitting: Nurse Practitioner

## 2021-04-09 DIAGNOSIS — Z7689 Persons encountering health services in other specified circumstances: Secondary | ICD-10-CM | POA: Diagnosis not present

## 2021-04-09 LAB — HOMOCYSTEINE: Homocysteine: 7.8 umol/L (ref 0.0–21.3)

## 2021-04-21 LAB — MTHFR DNA ANALYSIS

## 2021-04-22 ENCOUNTER — Encounter: Payer: Self-pay | Admitting: Nurse Practitioner

## 2021-06-03 ENCOUNTER — Other Ambulatory Visit: Payer: Self-pay | Admitting: Nurse Practitioner

## 2021-07-23 DIAGNOSIS — M9901 Segmental and somatic dysfunction of cervical region: Secondary | ICD-10-CM | POA: Diagnosis not present

## 2021-07-23 DIAGNOSIS — M542 Cervicalgia: Secondary | ICD-10-CM | POA: Diagnosis not present

## 2021-07-30 ENCOUNTER — Telehealth: Payer: Self-pay | Admitting: Student

## 2021-07-30 NOTE — Telephone Encounter (Signed)
Patient states she has already decreased dose.

## 2021-07-30 NOTE — Telephone Encounter (Signed)
Patient has been followed by integrative medicine and has had treatment with herbs for heavy metals. She is wondering if the metoprolol could cause her to feel weak and shaky. She wants to hold medication for a week or two to see if she feels better. I told her her palpations may return and to avoid excess caffeine use.I also told her she may have these symptoms because of the herbs she is taking for her detox. She was not able to tell me what herbs she is on. Her BP today is 115/60, HR 70

## 2021-07-30 NOTE — Telephone Encounter (Signed)
   Pt c/o medication issue:  1. Name of Medication: metoprolol succinate (TOPROL-XL) 25 MG 24 hr tablet  2. How are you currently taking this medication (dosage and times per day)? Take 1 tablet (25 mg total) by mouth daily.  3. Are you having a reaction (difficulty breathing--STAT)?   4. What is your medication issue? Pt said, she' been feeling weak and shakey since she started taking this med

## 2021-07-30 NOTE — Telephone Encounter (Signed)
    Would reduce to 12.'5mg'$  daily for a few days then can hold for a week or two to see if symptoms change. Palpitations can worsen with holding the beta-blocker so would monitor for worsening episodes.   Signed, Erma Heritage, PA-C 07/30/2021, 11:12 AM Pager: [phone]

## 2021-08-02 ENCOUNTER — Ambulatory Visit: Payer: Medicare HMO | Admitting: Nurse Practitioner

## 2021-08-06 DIAGNOSIS — H35422 Microcystoid degeneration of retina, left eye: Secondary | ICD-10-CM | POA: Diagnosis not present

## 2021-08-06 DIAGNOSIS — H2513 Age-related nuclear cataract, bilateral: Secondary | ICD-10-CM | POA: Diagnosis not present

## 2021-08-06 DIAGNOSIS — H43813 Vitreous degeneration, bilateral: Secondary | ICD-10-CM | POA: Diagnosis not present

## 2021-08-06 DIAGNOSIS — H353134 Nonexudative age-related macular degeneration, bilateral, advanced atrophic with subfoveal involvement: Secondary | ICD-10-CM | POA: Diagnosis not present

## 2021-09-20 ENCOUNTER — Encounter: Payer: Self-pay | Admitting: Nurse Practitioner

## 2021-09-20 ENCOUNTER — Encounter: Payer: Medicare HMO | Admitting: Nurse Practitioner

## 2021-09-20 NOTE — Progress Notes (Unsigned)
   This service is provided via telemedicine  No vital signs collected/recorded due to the encounter was a telemedicine visit.   Location of patient (ex: home, work):  Home  Patient consents to a telephone visit: Yes, see telephone visit dated 09/20/21  Location of the provider (ex: office, home):  [place], [place] Location   Name of any referring provider:  N/A  Names of all persons participating in the telemedicine service and their role in the encounter:  S.Chrae B/CMA, Sherrie Mustache, NP, and Patient   Time spent on call:  9 min with medical assistant

## 2021-09-21 ENCOUNTER — Ambulatory Visit (INDEPENDENT_AMBULATORY_CARE_PROVIDER_SITE_OTHER): Payer: Medicare HMO | Admitting: Nurse Practitioner

## 2021-09-21 ENCOUNTER — Encounter: Payer: Self-pay | Admitting: Nurse Practitioner

## 2021-09-21 ENCOUNTER — Telehealth: Payer: Self-pay

## 2021-09-21 DIAGNOSIS — Z0000 Encounter for general adult medical examination without abnormal findings: Secondary | ICD-10-CM

## 2021-09-21 DIAGNOSIS — E2839 Other primary ovarian failure: Secondary | ICD-10-CM

## 2021-09-21 NOTE — Progress Notes (Signed)
Subjective:   DEVONDA PEQUIGNOT is a 81 y.o. female who presents for Medicare Annual (Subsequent) preventive examination.  Review of Systems     Cardiac Risk Factors include: advanced age (>60mn, >>40women);family history of premature cardiovascular disease     Objective:    There were no vitals filed for this visit. There is no height or weight on file to calculate BMI.     09/20/2021    3:55 PM 03/29/2021    9:20 AM 11/18/2020   10:55 AM 11/16/2020    2:51 PM 09/18/2020    8:21 AM 05/20/2020    9:06 AM 11/20/2019    1:11 PM  Advanced Directives  Does Patient Have a Medical Advance Directive? Yes Yes Yes Yes Yes Yes Yes  Type of Advance Directive Living will Living will [place];Out of facility DNR (pink MOST or yellow form) Living will Living will [place]  Does patient want to make changes to medical advance directive? No - Patient declined No - Patient declined  No - Patient declined No - Patient declined No - Patient declined No - Patient declined  Copy of [place]?      Yes - validated most recent copy scanned in chart (See row information) Yes - validated most recent copy scanned in chart (See row information)    Current Medications (verified) Outpatient Encounter Medications as of 09/21/2021  Medication Sig   Ascorbic Acid (VITAMIN C) 1000 MG tablet Take 1,000 mg by mouth daily.   b complex vitamins tablet Take 1 tablet by mouth daily.   Cholecalciferol (VITAMIN D3) 25 MCG (1000 UT) CAPS Take 1 capsule by mouth daily.   Cyanocobalamin (B-12) 5000 MCG CAPS Take 1 capsule by mouth daily.   fluticasone (FLONASE) 50 MCG/ACT nasal spray Place 1 spray into both nostrils as needed for allergies or rhinitis.   gabapentin (NEURONTIN) 100 MG capsule Take 100 mg by mouth as needed.   levothyroxine (SYNTHROID) 75 MCG tablet TAKE 1 TABLET ONCE DAILY  30MINUTES BEFORE BREAKFAST ONAN EMPTY STOMACH   metoprolol succinate (TOPROL-XL) 25 MG 24 hr tablet Take 12.5 mg by mouth daily.   Omega-3 Fatty Acids (OMEGA-3 PO) Take 1 tablet by mouth daily.   Calcium Carb-Cholecalciferol (CALCIUM 500+D3 PO) Take 1 capsule by mouth daily. 200 units of vit D (Patient not taking: Reported on 09/21/2021)   [DISCONTINUED] gabapentin (NEURONTIN) 100 MG capsule Take 1 capsule (100 mg total) by mouth 2 (two) times daily.   [DISCONTINUED] metoprolol succinate (TOPROL-XL) 25 MG 24 hr tablet Take 1 tablet (25 mg total) by mouth daily. (Patient not taking: Reported on 09/21/2021)   No facility-administered encounter medications on file as of 09/21/2021.    Allergies (verified) Patient has no known allergies.   History: Past Medical History:  Diagnosis Date   [facility]    Neuropathy    Osteoarthritis    Per patient at New patient appointment    Seasonal allergies    Vitamin D deficiency    Past Surgical History:  Procedure Laterality Date   No previous surgery     Family History  Problem Relation Age of Onset   Heart disease Mother    Heart failure Mother    Heart disease Father    Prostate cancer Father    Heart disease Daughter    Diabetes Daughter    Neurologic  Disorder Daughter    Social History   Socioeconomic History   Marital status: Widowed    Spouse name: Not on file   Number of children: Not on file   Years of education: Not on file   Highest education level: Not on file  Occupational History   Not on file  Tobacco Use   Smoking status: Never   Smokeless tobacco: Never  Vaping Use   Vaping Use: Never used  Substance and Sexual Activity   Alcohol use: Yes    Comment: Occasional, last drink 1-2 years ago as of 2021    Drug use: Never   Sexual activity: Not on file  Other Topics Concern   Not on file  Social History Narrative   Per [facility] New Patient Packet Abstracted on 08/19/2019:       Diet: Vegetarian, gluten free       Caffeine: Coffee & Tea      Married, if yes what year: Widow, 1959      Do you live in a house, apartment, assisted living, condo, trailer, ect: House      Is it one or more stories: One stories, one person       Pets: 4 cats       Current/Past profession: Left Blank      Highest level or education completed: 12 th grade       Exercise:        Try          Type and how often: Need to clarify, unable to read what patient wrote          Living Will: Yes   DNR: Yes   POA/HPOA: Yes      Functional Status:   Do you have difficulty bathing or dressing yourself? No   Do you have difficulty preparing food or eating?No   Do you have difficulty managing your medications? No   Do you have difficulty managing your finances? No   Do you have difficulty affording your medications? No         Right Handed    Lives in a one story home    Social Determinants of Health   Financial Resource Strain: Not on file  Food Insecurity: Not on file  Transportation Needs: Not on file  Physical Activity: Not on file  Stress: Not on file  Social Connections: Not on file    Tobacco Counseling Counseling given: Not Answered   Clinical Intake:  Pre-visit preparation completed: Yes  Pain : No/denies pain     BMI - recorded: 21 Nutritional Risks: None Diabetes: No  How often do you need to have someone help you when you read instructions, pamphlets, or other written materials from your doctor or pharmacy?: 2 - Rarely (due to macular degeneration)  Diabetic?no         Activities of Daily Living    09/21/2021    3:30 PM  In your present state of health, do you have any difficulty performing the following activities:  Hearing? 0  Vision? 0  Difficulty concentrating or making decisions? 1  Comment some memory issues  Walking or climbing stairs? 1  Dressing or bathing? 0  Doing errands, shopping? 0  Preparing Food and eating ? N  Using  the Toilet? N  In the past six months, have you accidently leaked urine? Y  Do you have problems with loss of bowel control? N  Managing your Medications? N  Managing your Finances? N  Housekeeping or managing your Housekeeping? N    Patient Care Team: [name], NP as PCP - General (Geriatric Medicine) [name], MD as PCP - Cardiology (Cardiology) [name], MD as Referring Physician (Dermatology) [name], DO (Optometry) [name], MD as Consulting Physician (Otolaryngology) [name], DO as Consulting Physician (Neurology)  Indicate any recent Medical Services you may have received from other than [facility] providers in the past year (date may be approximate).     Assessment:   This is a routine wellness examination for [place].  Hearing/Vision screen Hearing Screening - Comments:: No hearing issues Vision Screening - Comments:: Last eye exam less than 12 months ago with Dr. Baird Cancer, was Dr.Cotter  Dietary issues and exercise activities discussed: Current Exercise Habits: Home exercise routine, Type of exercise: walking;Other - see comments, Time (Minutes): 15, Frequency (Times/Week): 4, Weekly Exercise (Minutes/Week): 60   Goals Addressed   None    Depression Screen    09/21/2021    3:23 PM 11/16/2020    2:51 PM 09/18/2020    8:25 AM 05/20/2020    9:06 AM 11/20/2019    1:11 PM 09/18/2019    8:38 AM 08/29/2019    9:06 AM  PHQ 2/9 Scores  PHQ - 2 Score 0 0 0 0 0 0 0    Fall Risk    09/21/2021    3:23 PM 11/18/2020   10:54 AM 11/16/2020    2:50 PM 09/18/2020    8:25 AM 05/20/2020    9:06 AM  Fall Risk   Falls in the past year? 0 0 0 0 0  Number falls in past yr: 0 0 0 0 0  Injury with Fall? 0 0 0 0 0  Risk for fall due to : No Fall Risks  No Fall Risks No Fall Risks   Follow up Falls evaluation completed  Falls evaluation completed Falls evaluation completed     [place]:  Any stairs in or around  the home? Yes  If so, are there any without handrails? No  Home free of loose throw rugs in walkways, pet beds, electrical cords, etc? Yes  Adequate lighting in your home to reduce risk of falls? Yes   ASSISTIVE DEVICES UTILIZED TO PREVENT FALLS:  Life alert? No  Use of a cane, walker or w/c? No  Grab bars in the bathroom? No  Shower chair or bench in shower? Yes  Elevated toilet seat or a handicapped toilet? Yes   TIMED UP AND GO:  Was the test performed? No .    Cognitive Function:        09/21/2021    3:25 PM 09/18/2020    8:26 AM 09/18/2019    8:42 AM  6CIT Screen  What Year? 0 points 4 points 0 points  What month? 0 points 0 points 0 points  What time? 0 points 0 points 0 points  Count back from 20 0 points 0 points 0 points  Months in reverse 0 points 0 points 0 points  Repeat phrase 2 points 0 points 0 points  Total Score 2 points 4 points 0 points    Immunizations Immunization History  Administered Date(s) Administered   Fluad Quad(high Dose 65+) 11/20/2019, 11/16/2020   Influenza-Unspecified 02/16/2018   Moderna SARS-COV2 Booster Vaccination 12/18/2019   Moderna Sars-Covid-2 Vaccination 02/12/2019, 03/15/2019   Pneumococcal Conjugate-13 04/25/2014   Pneumococcal-Unspecified 12/14/2016   Tdap 08/21/2012    TDAP status: Up to date  Flu Vaccine status: Due, Education has been provided regarding the importance of this vaccine. Advised may receive this vaccine at local pharmacy or Health Dept. Aware to provide a copy of the vaccination record if obtained from local pharmacy or Health Dept. Verbalized acceptance and understanding.  Pneumococcal vaccine status: Up to date  Covid-19 vaccine status: Information provided on how to obtain vaccines.   Qualifies for Shingles Vaccine? No   Zostavax completed  na   Shingrix Completed?: No.    Education has been provided regarding the importance of this vaccine. Patient has been advised to call insurance company to  determine out of pocket expense if they have not yet received this vaccine. Advised may also receive vaccine at local pharmacy or Health Dept. Verbalized acceptance and understanding.  Screening Tests Health Maintenance  Topic Date Due   Pneumonia Vaccine 58+ Years old (2 - PPSV23 or PCV20) 04/25/2015   COVID-19 Vaccine (3 - Moderna series) 02/12/2020   INFLUENZA VACCINE  08/31/2021   TETANUS/TDAP  08/22/2022   DEXA SCAN  Completed   HPV VACCINES  Aged Out   Zoster Vaccines- Shingrix  Discontinued    Health Maintenance  Health Maintenance Due  Topic Date Due   Pneumonia Vaccine 68+ Years old (2 - PPSV23 or PCV20) 04/25/2015   COVID-19 Vaccine (3 - Moderna series) 02/12/2020   INFLUENZA VACCINE  08/31/2021    Colorectal cancer screening: No longer required.   Mammogram status: No longer required due to age.  Bone Density status: Completed 08/30/2019. Results reflect: Bone density results: OSTEOPENIA. Repeat every 2 years.  Lung Cancer Screening: (Low Dose CT Chest recommended if Age 87-80 years, 30 pack-year currently smoking OR have quit w/in 15years.) does not qualify.   Lung Cancer Screening Referral: na  Additional Screening:  Hepatitis C Screening: does not qualify  Vision Screening: Recommended annual ophthalmology exams for early detection of glaucoma and other disorders of the eye. Is the patient up to date with their annual eye exam?  Yes  Who is the provider or what is the name of the office in which the patient attends annual eye exams? Dr Baird Cancer If pt is not established with a provider, would they like to be referred to a provider to establish care? No .   Dental Screening: Recommended annual dental exams for proper oral hygiene  Community Resource Referral / Chronic Care Management: CRR required this visit?  No   CCM required this visit?  No      Plan:     I have personally reviewed and noted the following in the patient's chart:   Medical and  social history Use of alcohol, tobacco or illicit drugs  Current medications and supplements including opioid prescriptions. Patient is not currently taking opioid prescriptions. Functional ability and status Nutritional status Physical activity Advanced directives List of other physicians Hospitalizations, surgeries, and ER visits in previous 12 months Vitals Screenings to include cognitive, depression, and falls Referrals and appointments  In addition, I have reviewed and discussed with patient certain preventive protocols, quality metrics, and best practice recommendations. A written personalized care plan for preventive services as well as general preventive health recommendations were provided to patient.     [name], NP   09/21/2021    Virtual Visit via Telephone Note  I connected with patient 09/21/21 at  3:20 PM EDT by telephone and verified that I am speaking with the correct person using two identifiers.  Location: Patient: home Provider: [facility]  I discussed the limitations, risks, security and privacy concerns of performing an evaluation and management service by telephone and the availability of in person appointments. I also discussed with the patient that there may be a patient responsible charge related to this service. The patient expressed understanding and agreed to proceed.   I discussed the assessment and treatment plan with the patient. The patient was provided an opportunity to ask questions and all were answered. The patient agreed with the plan and demonstrated an understanding of the instructions.   The patient was advised to call back or seek an in-person evaluation if the symptoms worsen or if the condition fails to improve as anticipated.  I provided 16 minutes of non-face-to-face time during this encounter.  Carlos American. [facility] printed and mailed

## 2021-09-21 NOTE — Patient Instructions (Signed)
Ms. Nancy Williams , Thank you for taking time to come for your Medicare Wellness Visit. I appreciate your ongoing commitment to your health goals. Please review the following plan we discussed and let me know if I can assist you in the future.   Screening recommendations/referrals: Colonoscopy aged out Mammogram ged out  Bone Density due at this time. To call [phone] Recommended yearly ophthalmology/optometry visit for glaucoma screening and checkup Recommended yearly dental visit for hygiene and checkup  Vaccinations: Influenza vaccine- due annually in September/October Pneumococcal vaccine up to date Tdap vaccine up to date Shingles vaccine - na    Advanced directives: on file.   Conditions/risks identified: advanced age, dizziness  Next appointment: yearly    Preventive Care 52 Years and Older, Female Preventive care refers to lifestyle choices and visits with your health care provider that can promote health and wellness. What does preventive care include? A yearly physical exam. This is also called an annual well check. Dental exams once or twice a year. Routine eye exams. Ask your health care provider how often you should have your eyes checked. Personal lifestyle choices, including: Daily care of your teeth and gums. Regular physical activity. Eating a healthy diet. Avoiding tobacco and drug use. Limiting alcohol use. Practicing safe sex. Taking low-dose aspirin every day. Taking vitamin and mineral supplements as recommended by your health care provider. What happens during an annual well check? The services and screenings done by your health care provider during your annual well check will depend on your age, overall health, lifestyle risk factors, and family history of disease. Counseling  Your health care provider may ask you questions about your: Alcohol use. Tobacco use. Drug use. Emotional well-being. Home and relationship well-being. Sexual activity. Eating  habits. History of falls. Memory and ability to understand (cognition). Work and work Statistician. Reproductive health. Screening  You may have the following tests or measurements: Height, weight, and BMI. Blood pressure. Lipid and cholesterol levels. These may be checked every 5 years, or more frequently if you are over 47 years old. Skin check. Lung cancer screening. You may have this screening every year starting at age 83 if you have a 30-pack-year history of smoking and currently smoke or have quit within the past 15 years. Fecal occult blood test (FOBT) of the stool. You may have this test every year starting at age 41. Flexible sigmoidoscopy or colonoscopy. You may have a sigmoidoscopy every 5 years or a colonoscopy every 10 years starting at age 16. Hepatitis C blood test. Hepatitis B blood test. Sexually transmitted disease (STD) testing. Diabetes screening. This is done by checking your blood sugar (glucose) after you have not eaten for a while (fasting). You may have this done every 1-3 years. Bone density scan. This is done to screen for osteoporosis. You may have this done starting at age 82. Mammogram. This may be done every 1-2 years. Talk to your health care provider about how often you should have regular mammograms. Talk with your health care provider about your test results, treatment options, and if necessary, the need for more tests. Vaccines  Your health care provider may recommend certain vaccines, such as: Influenza vaccine. This is recommended every year. Tetanus, diphtheria, and acellular pertussis (Tdap, Td) vaccine. You may need a Td booster every 10 years. Zoster vaccine. You may need this after age 11. Pneumococcal 13-valent conjugate (PCV13) vaccine. One dose is recommended after age 68. Pneumococcal polysaccharide (PPSV23) vaccine. One dose is recommended after age 66. Talk  to your health care provider about which screenings and vaccines you need and how  often you need them. This information is not intended to replace advice given to you by your health care provider. Make sure you discuss any questions you have with your health care provider. Document Released: 02/13/2015 Document Revised: 10/07/2015 Document Reviewed: 11/18/2014 Elsevier Interactive Patient Education  2017 [place] Prevention in the Home Falls can cause injuries. They can happen to people of all ages. There are many things you can do to make your home safe and to help prevent falls. What can I do on the outside of my home? Regularly fix the edges of walkways and driveways and fix any cracks. Remove anything that might make you trip as you walk through a door, such as a raised step or threshold. Trim any bushes or trees on the path to your home. Use bright outdoor lighting. Clear any walking paths of anything that might make someone trip, such as rocks or tools. Regularly check to see if handrails are loose or broken. Make sure that both sides of any steps have handrails. Any raised decks and porches should have guardrails on the edges. Have any leaves, snow, or ice cleared regularly. Use sand or salt on walking paths during winter. Clean up any spills in your garage right away. This includes oil or grease spills. What can I do in the bathroom? Use night lights. Install grab bars by the toilet and in the tub and shower. Do not use towel bars as grab bars. Use non-skid mats or decals in the tub or shower. If you need to sit down in the shower, use a plastic, non-slip stool. Keep the floor dry. Clean up any water that spills on the floor as soon as it happens. Remove soap buildup in the tub or shower regularly. Attach bath mats securely with double-sided non-slip rug tape. Do not have throw rugs and other things on the floor that can make you trip. What can I do in the bedroom? Use night lights. Make sure that you have a light by your bed that is easy to  reach. Do not use any sheets or blankets that are too big for your bed. They should not hang down onto the floor. Have a firm chair that has side arms. You can use this for support while you get dressed. Do not have throw rugs and other things on the floor that can make you trip. What can I do in the kitchen? Clean up any spills right away. Avoid walking on wet floors. Keep items that you use a lot in easy-to-reach places. If you need to reach something above you, use a strong step stool that has a grab bar. Keep electrical cords out of the way. Do not use floor polish or wax that makes floors slippery. If you must use wax, use non-skid floor wax. Do not have throw rugs and other things on the floor that can make you trip. What can I do with my stairs? Do not leave any items on the stairs. Make sure that there are handrails on both sides of the stairs and use them. Fix handrails that are broken or loose. Make sure that handrails are as long as the stairways. Check any carpeting to make sure that it is firmly attached to the stairs. Fix any carpet that is loose or worn. Avoid having throw rugs at the top or bottom of the stairs. If you do have throw rugs,  attach them to the floor with carpet tape. Make sure that you have a light switch at the top of the stairs and the bottom of the stairs. If you do not have them, ask someone to add them for you. What else can I do to help prevent falls? Wear shoes that: Do not have high heels. Have rubber bottoms. Are comfortable and fit you well. Are closed at the toe. Do not wear sandals. If you use a stepladder: Make sure that it is fully opened. Do not climb a closed stepladder. Make sure that both sides of the stepladder are locked into place. Ask someone to hold it for you, if possible. Clearly mark and make sure that you can see: Any grab bars or handrails. First and last steps. Where the edge of each step is. Use tools that help you move  around (mobility aids) if they are needed. These include: Canes. Walkers. Scooters. Crutches. Turn on the lights when you go into a dark area. Replace any light bulbs as soon as they burn out. Set up your furniture so you have a clear path. Avoid moving your furniture around. If any of your floors are uneven, fix them. If there are any pets around you, be aware of where they are. Review your medicines with your doctor. Some medicines can make you feel dizzy. This can increase your chance of falling. Ask your doctor what other things that you can do to help prevent falls. This information is not intended to replace advice given to you by your health care provider. Make sure you discuss any questions you have with your health care provider. Document Released: 11/13/2008 Document Revised: 06/25/2015 Document Reviewed: 02/21/2014 Elsevier Interactive Patient Education  2017 [employer].

## 2021-09-21 NOTE — Telephone Encounter (Signed)
Ms. Nancy Williams, Nancy Williams are scheduled for a virtual visit with your provider today.    Just as we do with appointments in the office, we must obtain your consent to participate.  Your consent will be active for this visit and any virtual visit you may have with one of our providers in the next 365 days.    If you have a MyChart account, I can also send a copy of this consent to you electronically.  All virtual visits are billed to your insurance company just like a traditional visit in the office.  As this is a virtual visit, video technology does not allow for your provider to perform a traditional examination.  This may limit your provider's ability to fully assess your condition.  If your provider identifies any concerns that need to be evaluated in person or the need to arrange testing such as labs, EKG, etc, we will make arrangements to do so.    Although advances in technology are sophisticated, we cannot ensure that it will always work on either your end or our end.  If the connection with a video visit is poor, we may have to switch to a telephone visit.  With either a video or telephone visit, we are not always able to ensure that we have a secure connection.   I need to obtain your verbal consent now.   Are you willing to proceed with your visit today?   Nancy Williams has provided verbal consent on 09/21/2021 for a virtual visit (video or telephone).   Leigh Aurora [place], [place] 09/21/2021  3:28 PM

## 2021-10-05 ENCOUNTER — Other Ambulatory Visit: Payer: Self-pay | Admitting: Nurse Practitioner

## 2021-10-07 ENCOUNTER — Encounter: Payer: Self-pay | Admitting: Nurse Practitioner

## 2021-10-12 ENCOUNTER — Ambulatory Visit (HOSPITAL_COMMUNITY)
Admission: RE | Admit: 2021-10-12 | Discharge: 2021-10-12 | Disposition: A | Discharge status: Home / Self Care | Payer: Medicare HMO | Source: Ambulatory Visit | Attending: Nurse Practitioner | Admitting: Nurse Practitioner

## 2021-10-12 DIAGNOSIS — E2839 Other primary ovarian failure: Secondary | ICD-10-CM | POA: Diagnosis not present

## 2021-10-12 DIAGNOSIS — M8589 Other specified disorders of bone density and structure, multiple sites: Secondary | ICD-10-CM | POA: Diagnosis not present

## 2021-10-12 DIAGNOSIS — Z85828 Personal history of other malignant neoplasm of skin: Secondary | ICD-10-CM | POA: Diagnosis not present

## 2021-10-12 DIAGNOSIS — L719 Rosacea, unspecified: Secondary | ICD-10-CM | POA: Diagnosis not present

## 2021-10-12 DIAGNOSIS — Z780 Asymptomatic menopausal state: Secondary | ICD-10-CM | POA: Diagnosis not present

## 2021-10-14 ENCOUNTER — Ambulatory Visit
Admission: RE | Admit: 2021-10-14 | Discharge: 2021-10-14 | Disposition: A | Discharge status: Home / Self Care | Payer: Medicare HMO | Source: Ambulatory Visit | Attending: Family Medicine | Admitting: Family Medicine

## 2021-10-14 VITALS — BP 107/64 | HR 72 | Temp 99.1°F | Resp 16

## 2021-10-14 DIAGNOSIS — J069 Acute upper respiratory infection, unspecified: Secondary | ICD-10-CM | POA: Insufficient documentation

## 2021-10-14 DIAGNOSIS — U071 COVID-19: Secondary | ICD-10-CM | POA: Insufficient documentation

## 2021-10-14 DIAGNOSIS — R059 Cough, unspecified: Secondary | ICD-10-CM | POA: Diagnosis not present

## 2021-10-14 LAB — RESP PANEL BY RT-PCR (FLU A&B, COVID) ARPGX2
Influenza A by PCR: NEGATIVE
Influenza B by PCR: NEGATIVE
SARS Coronavirus 2 by RT PCR: POSITIVE — AB

## 2021-10-14 MED ORDER — MOLNUPIRAVIR EUA 200MG CAPSULE: 4.0000 | ORAL_CAPSULE | Freq: Two times a day (BID) | ORAL | 0 refills | Status: AC

## 2021-10-14 NOTE — ED Provider Notes (Signed)
[facility]    CSN: [id number] Arrival date & time: 10/14/21  1217      History   Chief Complaint Chief Complaint  Patient presents with   Sore Throat   Nasal Congestion   Generalized Body Aches    HPI Nancy Williams is a 81 y.o. female.   Presenting today with several day history of fatigue and now 1 day history of cough, congestion, body aches, chills, sore throat.  Denies chest pain, shortness of breath, abdominal pain, nausea vomiting or diarrhea.  Taking ibuprofen as needed with mild relief of the body aches.  No known sick contacts recently.  No known pertinent chronic medical problems.  States she took a home COVID test that was positive but it was expired so she wanted to confirm the result.    Past Medical History:  Diagnosis Date   Hypothyroidism    Hashimoto's per [facility] New Patient Packet    Neuropathy    Osteoarthritis    Per patient at New patient appointment    Seasonal allergies    Vitamin D deficiency     Patient Active Problem List   Diagnosis Date Noted   Hyperlipemia 09/05/2019   Hypothyroidism    Muscle weakness (generalized) 02/21/2012   Pain in joint, shoulder region 02/16/2012    Past Surgical History:  Procedure Laterality Date   No previous surgery      OB History   No obstetric history on file.      Home Medications    Prior to Admission medications   Medication Sig Start Date End Date Taking? Authorizing Provider  molnupiravir EUA (LAGEVRIO) 200 mg CAPS capsule Take 4 capsules (800 mg total) by mouth 2 (two) times daily for 5 days. 10/14/21 10/19/21 Yes Volney American, PA-C  Ascorbic Acid (VITAMIN C) 1000 MG tablet Take 1,000 mg by mouth daily.    [provider]  b complex vitamins tablet Take 1 tablet by mouth daily.    [provider]  Calcium Carb-Cholecalciferol (CALCIUM 500+D3 PO) Take 1 capsule by mouth daily. 200 units of vit D Patient not taking: Reported on 09/21/2021    [provider]  Cholecalciferol (VITAMIN D3) 25 MCG (1000 UT) CAPS Take 1 capsule by mouth daily.    [provider]  Cyanocobalamin (B-12) 5000 MCG CAPS Take 1 capsule by mouth daily.    [provider]  fluticasone (FLONASE) 50 MCG/ACT nasal spray Place 1 spray into both nostrils as needed for allergies or rhinitis. 02/02/21   [name], NP  gabapentin (NEURONTIN) 100 MG capsule TAKE 1 CAPSULE BY MOUTH TWICE DAILY AS NEEDED 10/05/21   [name], NP  levothyroxine (SYNTHROID) 75 MCG tablet TAKE 1 TABLET ONCE DAILY 30MINUTES BEFORE BREAKFAST ONAN EMPTY STOMACH 10/05/21   [name], NP  metoprolol succinate (TOPROL-XL) 25 MG 24 hr tablet Take 12.5 mg by mouth daily.    [provider]  Omega-3 Fatty Acids (OMEGA-3 PO) Take 1 tablet by mouth daily.    [provider]    Family History Family History  Problem Relation Age of Onset   Heart disease Mother    Heart failure Mother    Heart disease Father    Prostate cancer Father    Heart disease Daughter    Diabetes Daughter    Neurologic Disorder Daughter     Social History Social History   Tobacco Use   Smoking status: Never   Smokeless tobacco: Never  Vaping Use   Vaping Use: Never used  Substance Use Topics   Alcohol use: Yes    Comment: Occasional, last drink 1-2 years ago as of 2021    Drug use: Never     Allergies   Patient has no known allergies.   Review of Systems Review of Systems Per HPI  Physical Exam Triage Vital Signs ED Triage Vitals  Enc Vitals Group     BP 10/14/21 1229 107/64     Pulse Rate 10/14/21 1229 72     Resp 10/14/21 1229 16     Temp 10/14/21 1229 99.1 F (37.3 C)     Temp Source 10/14/21 1229 Oral     SpO2 10/14/21 1229 98 %     Weight --      Height --      Head Circumference --      Peak Flow --      Pain Score 10/14/21 1228 2     Pain Loc --      Pain Edu? --      Excl. in [place]? --    No data found.  Updated Vital  Signs BP 107/64 (BP Location: Right Arm)   Pulse 72   Temp 99.1 F (37.3 C) (Oral)   Resp 16   SpO2 98%   Visual Acuity Right Eye Distance:   Left Eye Distance:   Bilateral Distance:    Right Eye Near:   Left Eye Near:    Bilateral Near:     Physical Exam Vitals and nursing note reviewed.  Constitutional:      Appearance: Normal appearance.  HENT:     Head: Atraumatic.     Right Ear: Tympanic membrane and external ear normal.     Left Ear: Tympanic membrane and external ear normal.     Nose: Rhinorrhea present.     Mouth/Throat:     Mouth: Mucous membranes are moist.     Pharynx: Posterior oropharyngeal erythema present.  Eyes:     Extraocular Movements: Extraocular movements intact.     Conjunctiva/sclera: Conjunctivae normal.  Cardiovascular:     Rate and Rhythm: Normal rate and regular rhythm.     Heart sounds: Normal heart sounds.  Pulmonary:     Effort: Pulmonary effort is normal.     Breath sounds: Normal breath sounds. No wheezing or rales.  Musculoskeletal:        General: Normal range of motion.     Cervical back: Normal range of motion and neck supple.  Skin:    General: Skin is warm and dry.  Neurological:     Mental Status: She is alert and oriented to person, place, and time.  Psychiatric:        Mood and Affect: Mood normal.        Thought Content: Thought content normal.      UC Treatments / Results  Labs (all labs ordered are listed, but only abnormal results are displayed) Labs Reviewed  RESP PANEL BY RT-PCR (FLU A&B, COVID) [id number]    EKG   Radiology No results found.  Procedures Procedures (including critical care time)  Medications Ordered in UC Medications - No data to display  Initial Impression / Assessment and Plan / UC Course  I have reviewed the triage vital signs and the nursing notes.  Pertinent labs & imaging results that were available during my care of the patient were reviewed by me and considered in my medical  decision making (see chart for  details).     Vitals and exam overall reassuring and suggestive of a viral upper respiratory infection.  Likely COVID-19 based on positive home test but will confirm with the PCR test today.  Start molnupiravir and discussed supportive over-the-counter medications and home care.  Return for worsening symptoms.  Final Clinical Impressions(s) / UC Diagnoses   Final diagnoses:  Viral URI with cough     Discharge Instructions      I have sent over an antiviral medication for COVID-19.  Your test results are pending and we should have those back tomorrow.  You may take over-the-counter cold and congestion medications, Mucinex, ibuprofen and Tylenol.  Make sure to drink plenty of fluids and get lots of rest.    ED Prescriptions     Medication Sig Dispense Auth. Provider   molnupiravir EUA (LAGEVRIO) 200 mg CAPS capsule Take 4 capsules (800 mg total) by mouth 2 (two) times daily for 5 days. 40 capsule Volney American, [place]      PDMP not reviewed this encounter.   Volney American, [place] 10/14/21 1310

## 2021-10-14 NOTE — ED Triage Notes (Signed)
Pt reports doing an at home covid test and testing positive. States that the test expired in 2022.

## 2021-10-14 NOTE — Discharge Instructions (Addendum)
I have sent over an antiviral medication for COVID-19.  Your test results are pending and we should have those back tomorrow.  You may take over-the-counter cold and congestion medications, Mucinex, ibuprofen and Tylenol.  Make sure to drink plenty of fluids and get lots of rest.

## 2021-10-17 ENCOUNTER — Encounter: Payer: Self-pay | Admitting: Nurse Practitioner

## 2021-10-18 ENCOUNTER — Other Ambulatory Visit: Payer: Self-pay

## 2021-10-18 DIAGNOSIS — R269 Unspecified abnormalities of gait and mobility: Secondary | ICD-10-CM

## 2021-10-18 DIAGNOSIS — R7303 Prediabetes: Secondary | ICD-10-CM

## 2021-10-18 DIAGNOSIS — L659 Nonscarring hair loss, unspecified: Secondary | ICD-10-CM

## 2021-10-18 DIAGNOSIS — R413 Other amnesia: Secondary | ICD-10-CM

## 2021-10-18 DIAGNOSIS — E063 Autoimmune thyroiditis: Secondary | ICD-10-CM

## 2021-10-26 ENCOUNTER — Other Ambulatory Visit: Payer: Self-pay

## 2021-10-26 ENCOUNTER — Other Ambulatory Visit: Payer: Self-pay | Admitting: Nurse Practitioner

## 2021-10-26 DIAGNOSIS — L659 Nonscarring hair loss, unspecified: Secondary | ICD-10-CM

## 2021-10-26 DIAGNOSIS — Z789 Other specified health status: Secondary | ICD-10-CM

## 2021-10-26 DIAGNOSIS — R413 Other amnesia: Secondary | ICD-10-CM

## 2021-10-26 DIAGNOSIS — E063 Autoimmune thyroiditis: Secondary | ICD-10-CM

## 2021-10-26 DIAGNOSIS — R269 Unspecified abnormalities of gait and mobility: Secondary | ICD-10-CM

## 2021-10-26 DIAGNOSIS — R7303 Prediabetes: Secondary | ICD-10-CM

## 2021-10-27 ENCOUNTER — Other Ambulatory Visit: Payer: Medicare HMO

## 2021-10-27 DIAGNOSIS — R269 Unspecified abnormalities of gait and mobility: Secondary | ICD-10-CM | POA: Diagnosis not present

## 2021-10-27 DIAGNOSIS — R7303 Prediabetes: Secondary | ICD-10-CM | POA: Diagnosis not present

## 2021-10-27 DIAGNOSIS — L659 Nonscarring hair loss, unspecified: Secondary | ICD-10-CM | POA: Diagnosis not present

## 2021-10-27 DIAGNOSIS — R413 Other amnesia: Secondary | ICD-10-CM | POA: Diagnosis not present

## 2021-10-27 DIAGNOSIS — E063 Autoimmune thyroiditis: Secondary | ICD-10-CM | POA: Diagnosis not present

## 2021-10-27 DIAGNOSIS — Z789 Other specified health status: Secondary | ICD-10-CM | POA: Diagnosis not present

## 2021-10-28 NOTE — Progress Notes (Unsigned)
Routed back to [facility], patient is already taking one tablet daily of B-12.

## 2021-11-02 LAB — IRON,TIBC AND FERRITIN PANEL
%SAT: 65 % (calc) — ABNORMAL HIGH (ref 16–45)
Ferritin: 72 ng/mL (ref 16–288)
Iron: 130 ug/dL (ref 45–160)
TIBC: 200 mcg/dL (calc) — ABNORMAL LOW (ref 250–450)

## 2021-11-02 LAB — CBC WITH DIFFERENTIAL/PLATELET
Absolute Monocytes: 844 cells/uL (ref 200–950)
Basophils Absolute: 58 cells/uL (ref 0–200)
Basophils Relative: 0.6 %
Eosinophils Absolute: 116 cells/uL (ref 15–500)
Eosinophils Relative: 1.2 %
HCT: 41.2 % (ref 35.0–45.0)
Hemoglobin: 13.6 g/dL (ref 11.7–15.5)
Lymphs Abs: 2619 cells/uL (ref 850–3900)
MCH: 29.8 pg (ref 27.0–33.0)
MCHC: 33 g/dL (ref 32.0–36.0)
MCV: 90.2 fL (ref 80.0–100.0)
MPV: 11 fL (ref 7.5–12.5)
Monocytes Relative: 8.7 %
Neutro Abs: 6063 cells/uL (ref 1500–7800)
Neutrophils Relative %: 62.5 %
Platelets: 283 10*3/uL (ref 140–400)
RBC: 4.57 10*6/uL (ref 3.80–5.10)
RDW: 12.8 % (ref 11.0–15.0)
Total Lymphocyte: 27 %
WBC: 9.7 10*3/uL (ref 3.8–10.8)

## 2021-11-02 LAB — HEMOGLOBIN A1C
Hgb A1c MFr Bld: 5.5 % of total Hgb (ref <5.7)
Mean Plasma Glucose: 111 mg/dL
eAG (mmol/L): 6.2 mmol/L

## 2021-11-02 LAB — T4, FREE: Free T4: 1.4 ng/dL (ref 0.8–1.8)

## 2021-11-02 LAB — IODINE, SERUM/PLASMA: Iodine: 69 mcg/L (ref 52–109)

## 2021-11-02 LAB — T3, FREE: T3, Free: 2.9 pg/mL (ref 2.3–4.2)

## 2021-11-02 LAB — THYROID PEROXIDASE ANTIBODIES (TPO) (REFL): Thyroperoxidase Ab SerPl-aCnc: 77 IU/mL — ABNORMAL HIGH (ref <9)

## 2021-11-02 LAB — RETICULOCYTES
ABS Retic: 50270 cells/uL (ref 20000–80000)
Retic Ct Pct: 1.1 %

## 2021-11-02 LAB — B12 AND FOLATE PANEL
Folate: >24 ng/mL
Vitamin B-12: >2000 pg/mL — ABNORMAL HIGH (ref 200–1100)

## 2021-11-02 LAB — TSH: TSH: 1.43 mIU/L (ref 0.40–4.50)

## 2021-11-02 LAB — THYROGLOBULIN LEVEL: Thyroglobulin: 33.8 ng/mL

## 2021-12-28 ENCOUNTER — Encounter: Payer: Self-pay | Admitting: Cardiology

## 2021-12-28 NOTE — Telephone Encounter (Signed)
lmtcb

## 2021-12-30 ENCOUNTER — Telehealth: Payer: Self-pay | Admitting: Cardiology

## 2021-12-30 NOTE — Telephone Encounter (Signed)
Patient is calling to talk with Dr. Domenic Polite or nurse. Please call back

## 2021-12-30 NOTE — Telephone Encounter (Signed)
Patient stats that she has been feeling intermittent fatigue and weakness for the past 1.5 years and wonders if this has something to do with the metoprolol. States that she has tried taking a half tablet but still feels the same way. States that she has spoken with PCP and had several testing done but states that no one has been able to tell her what is going on. States that she feels the problem could be with her hear. Denies any chest pain, sob or dizziness. Please advise

## 2021-12-30 NOTE — Telephone Encounter (Signed)
Patient made aware, states that she is going to go off of the metoprolol to see if that makes her feel better and will call back to schedule appointment if not any better. States that she would like to see Dr. Domenic Polite and not APP if possible.

## 2021-12-30 NOTE — Telephone Encounter (Signed)
lmtcb

## 2022-01-11 ENCOUNTER — Encounter: Payer: Self-pay | Admitting: Cardiology

## 2022-01-17 ENCOUNTER — Ambulatory Visit: Payer: Medicare HMO | Admitting: Nurse Practitioner

## 2022-01-31 ENCOUNTER — Encounter: Payer: Self-pay | Admitting: Nurse Practitioner

## 2022-02-01 ENCOUNTER — Encounter: Payer: Self-pay | Admitting: Nurse Practitioner

## 2022-02-02 ENCOUNTER — Ambulatory Visit (INDEPENDENT_AMBULATORY_CARE_PROVIDER_SITE_OTHER): Payer: Medicare HMO | Admitting: Nurse Practitioner

## 2022-02-02 ENCOUNTER — Encounter: Payer: Self-pay | Admitting: Nurse Practitioner

## 2022-02-02 VITALS — BP 126/78 | HR 76 | Temp 97.1°F | Ht 65.5 in | Wt 135.0 lb

## 2022-02-02 DIAGNOSIS — R002 Palpitations: Secondary | ICD-10-CM | POA: Diagnosis not present

## 2022-02-02 DIAGNOSIS — E038 Other specified hypothyroidism: Secondary | ICD-10-CM | POA: Diagnosis not present

## 2022-02-02 DIAGNOSIS — E063 Autoimmune thyroiditis: Secondary | ICD-10-CM | POA: Diagnosis not present

## 2022-02-02 DIAGNOSIS — Z23 Encounter for immunization: Secondary | ICD-10-CM | POA: Diagnosis not present

## 2022-02-02 DIAGNOSIS — Z789 Other specified health status: Secondary | ICD-10-CM

## 2022-02-02 MED ORDER — LEVOTHYROXINE SODIUM 50 MCG PO TABS: 50.0000 ug | ORAL_TABLET | Freq: Every day | ORAL | 0 refills | Status: DC

## 2022-02-02 NOTE — Patient Instructions (Addendum)
Decrease levothyroxine to 50 mcg daily    Make follow up LAB appt in 8 weeks.

## 2022-02-02 NOTE — Progress Notes (Signed)
Careteam: Patient Care Team: Nancy Chandler, NP as PCP - General (Geriatric Medicine) Nancy Sark, MD as PCP - Cardiology (Cardiology) Nancy Craze, MD as Referring Physician (Dermatology) Nancy Headings, DO (Optometry) Nancy Baptist, MD as Consulting Physician (Otolaryngology) Nancy Berthold, DO as Consulting Physician (Neurology)  PLACE OF SERVICE:  [place] Directive information Does Patient Have a Medical Advance Directive?: Yes, Type of Advance Directive: Living will, Does patient want to make changes to medical advance directive?: No - Patient declined  No Known Allergies  Chief Complaint  Patient presents with   Medical Management of Chronic Issues    Routine visit. Discuss need for flu, pneumonia, and covid vaccines or post pone if patient refuses. Patient would like to discuss thyroid issues. [facility] verirfied. Here with daughter Nancy Williams      HPI: Patient is a 82 y.o. female for routine follow up.   She was seeing integrative health- had heavy metal and mold in her system. She reports her balance is much better.  Weak and jittery feeling felt like it was heart related- she was placed on metoprolol but felt like it made her more weak and tired. So she stopped She reports she has more energy since being off metoprolol, no increase in palpitations.  Every morning she has a nervous, jittery weak feeling, gets better throughout the day but is the same every morning   Denies anxiety or depression  Tracking nutrition and getting enough calories and nutrition.   She is also on supplements from her integrative medicine doctor.   Drinks enough water, feels like this could be thyroid related.    Review of Systems:  Review of Systems  Constitutional:  Negative for chills, fever and weight loss.  HENT:  Negative for tinnitus.   Respiratory:  Negative for cough, sputum production and shortness of breath.   Cardiovascular:  Negative for chest pain,  palpitations and leg swelling.  Gastrointestinal:  Negative for abdominal pain, constipation, diarrhea and heartburn.  Genitourinary:  Negative for dysuria, frequency and urgency.  Musculoskeletal:  Negative for back pain, falls, joint pain and myalgias.  Skin: Negative.   Neurological:  Negative for dizziness and headaches.  Psychiatric/Behavioral:  Negative for depression and memory loss. The patient is not nervous/anxious and does not have insomnia.     Past Medical History:  Diagnosis Date   Hypothyroidism    Hashimoto's per [facility] New Patient Packet    Neuropathy    Osteoarthritis    Per patient at New patient appointment    Seasonal allergies    Vitamin D deficiency    Past Surgical History:  Procedure Laterality Date   No previous surgery     Social History:   reports that she has never smoked. She has never used smokeless tobacco. She reports current alcohol use. She reports that she does not use drugs.  Family History  Problem Relation Age of Onset   Heart disease Mother    Heart failure Mother    Heart disease Father    Prostate cancer Father    Heart disease Daughter    Diabetes Daughter    Neurologic Disorder Daughter     Medications: Patient's Medications  New Prescriptions   No medications on file  Previous Medications   B COMPLEX VITAMINS TABLET    Take 1 tablet by mouth daily.   CALCIUM CARB-CHOLECALCIFEROL (CALCIUM 500+D3 PO)    Take 1 capsule by mouth daily. 200 units of vit D  CHOLECALCIFEROL (VITAMIN D3) 25 MCG (1000 UT) CAPS    Take 1 capsule by mouth daily.   CYANOCOBALAMIN (B-12) 5000 MCG CAPS    Take 1 capsule by mouth daily.   FLUTICASONE (FLONASE) 50 MCG/ACT NASAL SPRAY    Place 1 spray into both nostrils as needed for allergies or rhinitis.   GABAPENTIN (NEURONTIN) 100 MG CAPSULE    TAKE 1 CAPSULE BY MOUTH TWICE DAILY AS NEEDED   LEVOTHYROXINE (SYNTHROID) 75 MCG TABLET    TAKE 1 TABLET ONCE DAILY 30MINUTES BEFORE BREAKFAST ONAN EMPTY STOMACH    OMEGA-3 FATTY ACIDS (OMEGA-3 PO)    Take 1 tablet by mouth daily.  Modified Medications   No medications on file  Discontinued Medications   ASCORBIC ACID (VITAMIN C) 1000 MG TABLET    Take 1,000 mg by mouth daily.   METOPROLOL SUCCINATE (TOPROL-XL) 25 MG 24 HR TABLET    Take 12.5 mg by mouth daily.    Physical Exam:  Vitals:   02/02/22 1315  BP: 126/78  Pulse: 76  Temp: (!) 97.1 F (36.2 C)  TempSrc: Temporal  SpO2: 97%  Weight: 135 lb (61.2 kg)  Height: 5' 5.5" (1.664 m)   Body mass index is 22.12 kg/m. Wt Readings from Last 3 Encounters:  02/02/22 135 lb (61.2 kg)  03/29/21 132 lb (59.9 kg)  02/18/21 131 lb 12.8 oz (59.8 kg)    Physical Exam Constitutional:      General: She is not in acute distress.    Appearance: She is well-developed. She is not diaphoretic.  HENT:     Head: Normocephalic and atraumatic.     Mouth/Throat:     Pharynx: No oropharyngeal exudate.  Eyes:     Conjunctiva/sclera: Conjunctivae normal.     Pupils: Pupils are equal, round, and reactive to light.  Cardiovascular:     Rate and Rhythm: Normal rate and regular rhythm.     Heart sounds: Normal heart sounds.  Pulmonary:     Effort: Pulmonary effort is normal.     Breath sounds: Normal breath sounds.  Abdominal:     General: Bowel sounds are normal.     Palpations: Abdomen is soft.  Musculoskeletal:     Cervical back: Normal range of motion and neck supple.     Right lower leg: No edema.     Left lower leg: No edema.  Skin:    General: Skin is warm and dry.  Neurological:     Mental Status: She is alert and oriented to person, place, and time.  Psychiatric:        Mood and Affect: Mood normal.     Labs reviewed: Basic Metabolic Panel: Recent Labs    02/25/21 1339 03/29/21 1345 10/27/21 0957  NA 135 139  --   K 3.8 4.8  --   CL 103 104  --   CO2 27 24  --   GLUCOSE 88 68  --   BUN 18 19  --   CREATININE 0.75 0.77  --   CALCIUM 9.2 9.3  --   TSH  --   --  1.43    Liver Function Tests: Recent Labs    03/29/21 1345  AST 21  ALT 14  BILITOT 0.3  PROT 7.0   No results for input(s): "LIPASE", "AMYLASE" in the last 8760 hours. No results for input(s): "AMMONIA" in the last 8760 hours. CBC: Recent Labs    03/29/21 1345 10/27/21 0957  WBC 8.1 9.7  NEUTROABS 3,767  6,063  HGB 13.3 13.6  HCT 39.9 41.2  MCV 90.3 90.2  PLT 250 283   Lipid Panel: Recent Labs    03/29/21 1345  CHOL 215*  HDL 94  LDLCALC 98  TRIG 130  CHOLHDL 2.3   TSH: Recent Labs    10/27/21 0957  TSH 1.43   A1C: Lab Results  Component Value Date   HGBA1C 5.5 10/27/2021     Assessment/Plan 1. Hypothyroidism due to Hashimoto's thyroiditis -feels like she is receiving too much thyroid medication and wants to cut back and follow labs, will decrease synthroid to 50 mcg daily and follow up TSH in 8 weeks.  - levothyroxine (SYNTHROID) 50 MCG tablet; Take 1 tablet (50 mcg total) by mouth daily before breakfast.  Dispense: 90 tablet; Refill: 0 - TSH; Future  2. Need for influenza vaccination - Flu Vaccine QUAD High Dose(Fluad)  3. Vegan -continues on supplements - CMP with eGFR([facility]); Future - CBC with Differential/Platelet; Future  4. Palpitations -no worsening of symptoms. Continues off metoprolol and plans to follow up with cardiology   Return in about 6 months (around 08/03/2022) for routine follow up . Carlos American. [place], [facility] [phone]

## 2022-02-04 DIAGNOSIS — H35422 Microcystoid degeneration of retina, left eye: Secondary | ICD-10-CM | POA: Diagnosis not present

## 2022-02-04 DIAGNOSIS — H2513 Age-related nuclear cataract, bilateral: Secondary | ICD-10-CM | POA: Diagnosis not present

## 2022-02-04 DIAGNOSIS — H43813 Vitreous degeneration, bilateral: Secondary | ICD-10-CM | POA: Diagnosis not present

## 2022-02-04 DIAGNOSIS — H353134 Nonexudative age-related macular degeneration, bilateral, advanced atrophic with subfoveal involvement: Secondary | ICD-10-CM | POA: Diagnosis not present

## 2022-02-12 ENCOUNTER — Encounter (HOSPITAL_COMMUNITY): Payer: Self-pay

## 2022-02-12 ENCOUNTER — Emergency Department (HOSPITAL_COMMUNITY)
Admission: EM | Admit: 2022-02-12 | Discharge: 2022-02-12 | Disposition: A | Discharge status: Home / Self Care | Payer: Medicare HMO | Attending: Emergency Medicine | Admitting: Emergency Medicine

## 2022-02-12 ENCOUNTER — Emergency Department (HOSPITAL_COMMUNITY): Payer: Medicare HMO

## 2022-02-12 DIAGNOSIS — E039 Hypothyroidism, unspecified: Secondary | ICD-10-CM | POA: Diagnosis not present

## 2022-02-12 DIAGNOSIS — S0083XA Contusion of other part of head, initial encounter: Secondary | ICD-10-CM

## 2022-02-12 DIAGNOSIS — S0181XA Laceration without foreign body of other part of head, initial encounter: Secondary | ICD-10-CM | POA: Diagnosis not present

## 2022-02-12 DIAGNOSIS — W108XXA Fall (on) (from) other stairs and steps, initial encounter: Secondary | ICD-10-CM | POA: Diagnosis not present

## 2022-02-12 DIAGNOSIS — Z79899 Other long term (current) drug therapy: Secondary | ICD-10-CM | POA: Diagnosis not present

## 2022-02-12 DIAGNOSIS — Z23 Encounter for immunization: Secondary | ICD-10-CM | POA: Diagnosis not present

## 2022-02-12 DIAGNOSIS — M47812 Spondylosis without myelopathy or radiculopathy, cervical region: Secondary | ICD-10-CM | POA: Diagnosis not present

## 2022-02-12 DIAGNOSIS — S0990XA Unspecified injury of head, initial encounter: Secondary | ICD-10-CM | POA: Diagnosis present

## 2022-02-12 DIAGNOSIS — S0003XA Contusion of scalp, initial encounter: Secondary | ICD-10-CM | POA: Diagnosis not present

## 2022-02-12 DIAGNOSIS — Y9301 Activity, walking, marching and hiking: Secondary | ICD-10-CM | POA: Insufficient documentation

## 2022-02-12 LAB — BASIC METABOLIC PANEL
Anion gap: 8 (ref 5–15)
BUN: 26 mg/dL — ABNORMAL HIGH (ref 8–23)
CO2: 26 mmol/L (ref 22–32)
Calcium: 9.3 mg/dL (ref 8.9–10.3)
Chloride: 103 mmol/L (ref 98–111)
Creatinine, Ser: 0.76 mg/dL (ref 0.44–1.00)
GFR, Estimated: >60 mL/min (ref >60)
Glucose, Bld: 128 mg/dL — ABNORMAL HIGH (ref 70–99)
Potassium: 4.7 mmol/L (ref 3.5–5.1)
Sodium: 137 mmol/L (ref 135–145)

## 2022-02-12 LAB — CBC
HCT: 43.3 % (ref 36.0–46.0)
Hemoglobin: 13.9 g/dL (ref 12.0–15.0)
MCH: 30 pg (ref 26.0–34.0)
MCHC: 32.1 g/dL (ref 30.0–36.0)
MCV: 93.3 fL (ref 80.0–100.0)
Platelets: 285 10*3/uL (ref 150–400)
RBC: 4.64 MIL/uL (ref 3.87–5.11)
RDW: 14.4 % (ref 11.5–15.5)
WBC: 12.7 10*3/uL — ABNORMAL HIGH (ref 4.0–10.5)
nRBC: 0 % (ref 0.0–0.2)

## 2022-02-12 MED ORDER — BACITRACIN ZINC 500 UNIT/GM EX OINT
TOPICAL_OINTMENT | Freq: Two times a day (BID) | CUTANEOUS | Status: DC
  Filled 2022-02-12: qty 0.9

## 2022-02-12 MED ORDER — TETANUS-DIPHTH-ACELL PERTUSSIS 5-2.5-18.5 LF-MCG/0.5 IM SUSY
0.5000 mL | PREFILLED_SYRINGE | Freq: Once | INTRAMUSCULAR | Status: AC
  Administered 2022-02-12: 0.5 mL via INTRAMUSCULAR
  Filled 2022-02-12: qty 0.5

## 2022-02-12 MED ORDER — LIDOCAINE-EPINEPHRINE (PF) 2 %-1:200000 IJ SOLN
10.0000 mL | Freq: Once | INTRAMUSCULAR | Status: AC
  Administered 2022-02-12: 10 mL
  Filled 2022-02-12: qty 20

## 2022-02-12 MED ORDER — LIDOCAINE-EPINEPHRINE-TETRACAINE (LET) TOPICAL GEL
3.0000 mL | Freq: Once | TOPICAL | Status: AC
  Administered 2022-02-12: 3 mL via TOPICAL
  Filled 2022-02-12: qty 3

## 2022-02-12 MED ORDER — BACITRACIN ZINC 500 UNIT/GM EX OINT: 1.0000 | TOPICAL_OINTMENT | Freq: Two times a day (BID) | CUTANEOUS | 0 refills | Status: DC

## 2022-02-12 NOTE — Discharge Instructions (Addendum)
Evaluation for your fall today was overall reassuring.  CT scans were negative for acute injury related to your fall.  Sutured repair Keep the laceration site dry for the next 24 hours and leave the dressing in place. After 24 hours you may remove the dressing and gently clean the laceration site with antibacterial soap and warm water. Do not scrub the area. Do not soak the area and water for long periods of time. Don't use hydrogen peroxide, iodine-based solutions, or alcohol, which can slow healing, and will probably be painful! Apply topical bacitracin 1-2 times per day for the next 3-5 days. Return to the PCP, urgent care, or emergency department in 5-7 days for removal of the sutures.  You should return sooner for any signs of infection which would include increased redness around the wound, increased swelling, new drainage of yellow pus.    Adhesive tape repair Tape strips have the advantage of being less painful to apply and lower risk of infection, but do require more caution on your part, as they are more fragile than other skin closure techniques.  It's important to: -Keep the tape clean and dry -Avoid picking at the tape or rubbing the area -Avoid soaking in water (showering is okay-bathing is not) -The tape strips will fall off on their own in about 5-7 days (if they don't, you can gently remove them or soak the wound in water at this time to loosen them).  At this time, scar tissue will be forming under the surface of the wound and your body will do the rest of the work of healing.

## 2022-02-12 NOTE — ED Provider Notes (Addendum)
[facility] EMERGENCY DEPARTMENT Provider Note   CSN: [id number] Arrival date & time: 02/12/22  1029     History  Chief Complaint  Patient presents with   Fall   Head Injury   HPI Nancy Williams is a 82 y.o. female with hyperlipidemia and hypothyroidism presenting for fall and head injury.  Occurred a couple hours ago.  Patient was walking down a couple of steps with a box and tripped and fell.   Landed on her forehead and nose.  Now with large hematoma to her forehead and bruising to her her nose.  Denies loss of consciousness, visual disturbance, slurred speech, facial droop and extremity weakness and numbness.  Denies trouble breathing.  Denies use of blood thinners.  States she took some ibuprofen for headache after her fall and she longer has pain.  Denies dizziness, lightheadedness and chest pain.  Denies near-syncope and syncope.   Fall Associated symptoms include headaches.  Head Injury Associated symptoms: headache        Home Medications Prior to Admission medications   Medication Sig Start Date End Date Taking? Authorizing Provider  bacitracin ointment Apply 1 Application topically 2 (two) times daily. 02/12/22  Yes Harriet Pho, PA-C  b complex vitamins tablet Take 1 tablet by mouth daily.    [provider]  Calcium Carb-Cholecalciferol (CALCIUM 500+D3 PO) Take 1 capsule by mouth daily. 200 units of vit D    [provider]  Cholecalciferol (VITAMIN D3) 25 MCG (1000 UT) CAPS Take 1 capsule by mouth daily.    [provider]  Cyanocobalamin (B-12) 5000 MCG CAPS Take 1 capsule by mouth daily.    [provider]  fluticasone (FLONASE) 50 MCG/ACT nasal spray Place 1 spray into both nostrils as needed for allergies or rhinitis. 02/02/21   [name], NP  gabapentin (NEURONTIN) 100 MG capsule TAKE 1 CAPSULE BY MOUTH TWICE DAILY AS NEEDED 10/05/21   [name], NP  levothyroxine (SYNTHROID) 50 MCG tablet Take 1 tablet (50  mcg total) by mouth daily before breakfast. 02/02/22   [name], NP  Omega-3 Fatty Acids (OMEGA-3 PO) Take 1 tablet by mouth daily.    [provider]      Allergies    Patient has no known allergies.    Review of Systems   Review of Systems  HENT:         Forehead and nasal pain and forehead laceration  Neurological:  Positive for headaches.    Physical Exam   Vitals:   02/12/22 1125 02/12/22 1330  BP: 132/67 136/70  Pulse: 69 64  Resp: 16 16  Temp: 97.8 F (36.6 C)   SpO2: 98% 98%    CONSTITUTIONAL:  well-appearing, NAD NEURO: GCS 15. Speech is goal oriented. No deficits appreciated to CN III-XII; symmetric eyebrow raise, no facial drooping, tongue midline. Patient has equal grip strength bilaterally with 5/5 strength against resistance in all major muscle groups bilaterally. Sensation to light touch intact. Patient moves extremities without ataxia. Normal finger-nose-finger. Patient ambulatory with steady gait.  EYES:  eyes equal and reactive ENT/NECK:  Supple, no stridor, no Battle sign, raccoon eyes or hemotympanum Head: Large hematoma at middle of forehead, ecchymosis and swelling noted about the superior portion of the nasal bridge, 2 forehead abrasions actively bleeding approximately 2 cm in length CARDIO: regular rate and rhythm, appears well-perfused PULM:  No respiratory distress, CTAB GI/GU:  non-distended, soft, non tender MSK/SPINE:  No gross deformities, no  edema, moves all extremities SKIN:  no rash    *Additional and/or pertinent findings included in MDM below    ED Results / Procedures / Treatments   Labs (all labs ordered are listed, but only abnormal results are displayed) Labs Reviewed  CBC - Abnormal; Notable for the following components:      Result Value   WBC 12.7 (*)    All other components within normal limits  BASIC METABOLIC PANEL - Abnormal; Notable for the following components:   Glucose, Bld 128 (*)    BUN 26 (*)     All other components within normal limits    EKG None  Radiology CT Head Wo Contrast  Result Date: 02/12/2022 CLINICAL DATA:  Polytrauma, blunt; Facial trauma, blunt EXAM: CT HEAD WITHOUT CONTRAST CT MAXILLOFACIAL WITHOUT CONTRAST CT CERVICAL SPINE WITHOUT CONTRAST TECHNIQUE: Multidetector CT imaging of the head, cervical spine, and maxillofacial structures were performed using the standard protocol without intravenous contrast. Multiplanar CT image reconstructions of the cervical spine and maxillofacial structures were also generated. RADIATION DOSE REDUCTION: This exam was performed according to the departmental dose-optimization program which includes automated exposure control, adjustment of the mA and/or kV according to patient size and/or use of iterative reconstruction technique. COMPARISON:  10/05/2011 FINDINGS: CT HEAD FINDINGS Brain: No evidence of acute infarction, hemorrhage, hydrocephalus, extra-axial collection or mass lesion/mass effect. Mild age related cerebral volume loss. Vascular: Atherosclerotic calcifications involving the large vessels of the skull base. No unexpected hyperdense vessel. Skull: Normal. Negative for fracture or focal lesion. Other: Moderate-sized anterior frontal scalp/forehead hematoma. CT MAXILLOFACIAL FINDINGS Osseous: No acute maxillofacial bone fracture. Bony orbital walls are intact. Mandible intact. Temporomandibular joints are aligned without dislocation. Orbits: Negative. No traumatic or inflammatory finding. Sinuses: Clear. Soft tissues: Negative. CT CERVICAL SPINE FINDINGS Alignment: Facet joints are aligned without dislocation or traumatic listhesis. Dens and lateral masses are aligned. Skull base and vertebrae: No acute fracture. No primary bone lesion or focal pathologic process. Soft tissues and spinal canal: No prevertebral fluid or swelling. No visible canal hematoma. Disc levels:  Moderate multilevel cervical spondylosis. Upper chest: Included lung  apices are clear. Other: Bilateral carotid atherosclerosis. IMPRESSION: 1. No acute intracranial abnormality. 2. Moderate-sized anterior frontal scalp/forehead hematoma. No underlying calvarial fracture. 3. No acute maxillofacial bone fracture. 4. No acute cervical spine fracture or subluxation. Electronically Signed   By: Davina Poke D.O.   On: 02/12/2022 12:19   CT Cervical Spine Wo Contrast  Result Date: 02/12/2022 CLINICAL DATA:  Polytrauma, blunt; Facial trauma, blunt EXAM: CT HEAD WITHOUT CONTRAST CT MAXILLOFACIAL WITHOUT CONTRAST CT CERVICAL SPINE WITHOUT CONTRAST TECHNIQUE: Multidetector CT imaging of the head, cervical spine, and maxillofacial structures were performed using the standard protocol without intravenous contrast. Multiplanar CT image reconstructions of the cervical spine and maxillofacial structures were also generated. RADIATION DOSE REDUCTION: This exam was performed according to the departmental dose-optimization program which includes automated exposure control, adjustment of the mA and/or kV according to patient size and/or use of iterative reconstruction technique. COMPARISON:  10/05/2011 FINDINGS: CT HEAD FINDINGS Brain: No evidence of acute infarction, hemorrhage, hydrocephalus, extra-axial collection or mass lesion/mass effect. Mild age related cerebral volume loss. Vascular: Atherosclerotic calcifications involving the large vessels of the skull base. No unexpected hyperdense vessel. Skull: Normal. Negative for fracture or focal lesion. Other: Moderate-sized anterior frontal scalp/forehead hematoma. CT MAXILLOFACIAL FINDINGS Osseous: No acute maxillofacial bone fracture. Bony orbital walls are intact. Mandible intact. Temporomandibular joints are aligned without dislocation. Orbits: Negative. No traumatic or  inflammatory finding. Sinuses: Clear. Soft tissues: Negative. CT CERVICAL SPINE FINDINGS Alignment: Facet joints are aligned without dislocation or traumatic listhesis.  Dens and lateral masses are aligned. Skull base and vertebrae: No acute fracture. No primary bone lesion or focal pathologic process. Soft tissues and spinal canal: No prevertebral fluid or swelling. No visible canal hematoma. Disc levels:  Moderate multilevel cervical spondylosis. Upper chest: Included lung apices are clear. Other: Bilateral carotid atherosclerosis. IMPRESSION: 1. No acute intracranial abnormality. 2. Moderate-sized anterior frontal scalp/forehead hematoma. No underlying calvarial fracture. 3. No acute maxillofacial bone fracture. 4. No acute cervical spine fracture or subluxation. Electronically Signed   By: Davina Poke D.O.   On: 02/12/2022 12:19   CT Maxillofacial WO CM  Result Date: 02/12/2022 CLINICAL DATA:  Polytrauma, blunt; Facial trauma, blunt EXAM: CT HEAD WITHOUT CONTRAST CT MAXILLOFACIAL WITHOUT CONTRAST CT CERVICAL SPINE WITHOUT CONTRAST TECHNIQUE: Multidetector CT imaging of the head, cervical spine, and maxillofacial structures were performed using the standard protocol without intravenous contrast. Multiplanar CT image reconstructions of the cervical spine and maxillofacial structures were also generated. RADIATION DOSE REDUCTION: This exam was performed according to the departmental dose-optimization program which includes automated exposure control, adjustment of the mA and/or kV according to patient size and/or use of iterative reconstruction technique. COMPARISON:  10/05/2011 FINDINGS: CT HEAD FINDINGS Brain: No evidence of acute infarction, hemorrhage, hydrocephalus, extra-axial collection or mass lesion/mass effect. Mild age related cerebral volume loss. Vascular: Atherosclerotic calcifications involving the large vessels of the skull base. No unexpected hyperdense vessel. Skull: Normal. Negative for fracture or focal lesion. Other: Moderate-sized anterior frontal scalp/forehead hematoma. CT MAXILLOFACIAL FINDINGS Osseous: No acute maxillofacial bone fracture. Bony  orbital walls are intact. Mandible intact. Temporomandibular joints are aligned without dislocation. Orbits: Negative. No traumatic or inflammatory finding. Sinuses: Clear. Soft tissues: Negative. CT CERVICAL SPINE FINDINGS Alignment: Facet joints are aligned without dislocation or traumatic listhesis. Dens and lateral masses are aligned. Skull base and vertebrae: No acute fracture. No primary bone lesion or focal pathologic process. Soft tissues and spinal canal: No prevertebral fluid or swelling. No visible canal hematoma. Disc levels:  Moderate multilevel cervical spondylosis. Upper chest: Included lung apices are clear. Other: Bilateral carotid atherosclerosis. IMPRESSION: 1. No acute intracranial abnormality. 2. Moderate-sized anterior frontal scalp/forehead hematoma. No underlying calvarial fracture. 3. No acute maxillofacial bone fracture. 4. No acute cervical spine fracture or subluxation. Electronically Signed   By: Davina Poke D.O.   On: 02/12/2022 12:19    Procedures .Marland KitchenLaceration Repair  Date/Time: 02/12/2022 2:39 PM  Performed by: Harriet Pho, PA-C Authorized by: Harriet Pho, PA-C   Consent:    Consent obtained:  Verbal   Consent given by:  Patient   Risks discussed:  Infection and retained foreign body   Alternatives discussed:  No treatment Universal protocol:    Procedure explained and questions answered to patient or proxy's satisfaction: yes     Patient identity confirmed:  Verbally with patient and arm band Anesthesia:    Anesthesia method:  Topical application and local infiltration   Topical anesthetic:  LET and lidocaine gel   Local anesthetic:  Lidocaine 2% WITH epi Laceration details:    Location:  Face   Face location:  Forehead   Length (cm):  2   Depth (mm):  10 Pre-procedure details:    Preparation:  Patient was prepped and draped in usual sterile fashion Exploration:    Hemostasis achieved with:  Direct pressure   Imaging outcome: foreign body  not  noted     Wound extent: areolar tissue violated     Contaminated: no   Treatment:    Area cleansed with:  Saline   Amount of cleaning:  Extensive   Irrigation solution:  Sterile saline   Irrigation method:  Pressure wash   Debridement:  None   Undermining:  None Skin repair:    Repair method:  Sutures   Suture size:  5-0   Suture material:  Prolene   Suture technique:  Simple interrupted   Number of sutures:  4 Approximation:    Approximation:  Close Repair type:    Repair type:  Intermediate Post-procedure details:    Dressing:  Antibiotic ointment and non-adherent dressing     Medications Ordered in ED Medications  bacitracin ointment ( Topical Given 02/12/22 1346)  lidocaine-EPINEPHrine-tetracaine (LET) topical gel (3 mLs Topical Given 02/12/22 1345)  lidocaine-EPINEPHrine (XYLOCAINE W/EPI) 2 %-1:200000 (PF) injection 10 mL (10 mLs Infiltration Given 02/12/22 1351)  Tdap (BOOSTRIX) injection 0.5 mL (0.5 mLs Intramuscular Given 02/12/22 1345)    ED Course/ Medical Decision Making/ A&P Clinical Course as of 02/12/22 1448  Sat Feb 12, 2022  1228 CT Cervical Spine Wo Contrast [JR]  [id number] 82 year old female with mechanical fall hitting her head no loss of consciousness.  She has a couple of abrasions lacerations on her forehead.  Is likely will need some level of repair.  CT imaging does not show any acute traumatic issues otherwise.  Anticipate discharge [MB]    Clinical Course User Index [JR] Harriet Pho, PA-C [MB] Hayden Rasmussen, MD                             Medical Decision Making Amount and/or Complexity of Data Reviewed Labs: ordered. Radiology: ordered. Decision-making details documented in ED Course.  Risk OTC drugs. Prescription drug management.   82 year old female who is well-appearing hemodynamically stable presenting for mechanical fall and head injury.  Physical exam was notable for moderately sized hematoma of the forehead and bruising  around the nose but otherwise reassuring with no focal neurodeficit.  Considered stroke and ICH but unlikely given reassuring neuroexam and unremarkable CT scans.  Considered skull fracture or maxillofacial fracture but unlikely given reassuring CT scans.  Laceration repair of the forehead was well-tolerated without complication.  Advised conservative treatment and wound care at home.  Also advised to follow-up with PCP for wound reevaluation and suture removal.        Final Clinical Impression(s) / ED Diagnoses Final diagnoses:  Facial laceration, initial encounter  Traumatic hematoma of forehead, initial encounter    Rx / DC Orders ED Discharge Orders          Ordered    bacitracin ointment  2 times daily        02/12/22 1447              Lindell Spar 02/12/22 1448    Clinical Course as of 02/12/22 1739  Sat Feb 12, 2022  1228 CT Cervical Spine Wo Contrast [JR]  4810 82 year old female with mechanical fall hitting her head no loss of consciousness.  She has a couple of abrasions lacerations on her forehead.  Is likely will need some level of repair.  CT imaging does not show any acute traumatic issues otherwise.  Anticipate discharge [MB]    Clinical Course User Index [JR] Harriet Pho, PA-C [MB] Hayden Rasmussen, MD  Hayden Rasmussen, MD 02/12/22 1738    Hayden Rasmussen, MD 02/12/22 1739

## 2022-02-12 NOTE — ED Notes (Signed)
Dermabond placed at bedside. 

## 2022-02-12 NOTE — ED Triage Notes (Signed)
Pt to ED c/o mechanical fall that occurred a couple hours ago while walking down the steps. Pt denies LOC. Large hematoma noted to center forehead. Pt A&O in triage.

## 2022-02-17 ENCOUNTER — Ambulatory Visit
Admission: RE | Admit: 2022-02-17 | Discharge: 2022-02-17 | Disposition: A | Discharge status: Home / Self Care | Payer: Medicare HMO | Source: Ambulatory Visit | Attending: Nurse Practitioner | Admitting: Nurse Practitioner

## 2022-02-17 VITALS — BP 137/76 | HR 69 | Temp 98.0°F | Resp 20

## 2022-02-17 DIAGNOSIS — Z4802 Encounter for removal of sutures: Secondary | ICD-10-CM

## 2022-02-17 NOTE — Discharge Instructions (Signed)
Please return in 48 hours to have the wound re-evaluated to see if the sutures are ready for removal

## 2022-02-17 NOTE — ED Triage Notes (Signed)
Pt presents in urgent care with 4 sutures to be removes. Pt states fever or discharge.

## 2022-02-17 NOTE — ED Provider Notes (Signed)
[facility]    CSN: [id number] Arrival date & time: 02/17/22  1415      History   Chief Complaint No chief complaint on file.   HPI Nancy Williams is a 82 y.o. female.   Patient presents today for suture removal.  Reports 5 days ago, she fell down the steps in her garage and hit her face.  She had an open area to her forehead that was repaired with 4 sutures in the emergency room on 02/12/2022.  She denies any pain currently.  No drainage from the repaired laceration.  No fevers or nausea/vomiting.  She has been applying bacitracin ointment over top of the laceration.    Past Medical History:  Diagnosis Date   Hypothyroidism    Hashimoto's per [facility] New Patient Packet    Neuropathy    Osteoarthritis    Per patient at New patient appointment    Seasonal allergies    Vitamin D deficiency     Patient Active Problem List   Diagnosis Date Noted   Hyperlipemia 09/05/2019   Hypothyroidism    Muscle weakness (generalized) 02/21/2012   Pain in joint, shoulder region 02/16/2012    Past Surgical History:  Procedure Laterality Date   No previous surgery      OB History   No obstetric history on file.      Home Medications    Prior to Admission medications   Medication Sig Start Date End Date Taking? Authorizing Provider  b complex vitamins tablet Take 1 tablet by mouth daily.    [provider]  bacitracin ointment Apply 1 Application topically 2 (two) times daily. 02/12/22   Harriet Pho, PA-C  Calcium Carb-Cholecalciferol (CALCIUM 500+D3 PO) Take 1 capsule by mouth daily. 200 units of vit D    [provider]  Cholecalciferol (VITAMIN D3) 25 MCG (1000 UT) CAPS Take 1 capsule by mouth daily.    [provider]  Cyanocobalamin (B-12) 5000 MCG CAPS Take 1 capsule by mouth daily.    [provider]  fluticasone (FLONASE) 50 MCG/ACT nasal spray Place 1 spray into both nostrils as needed for allergies or rhinitis. 02/02/21    [name], NP  gabapentin (NEURONTIN) 100 MG capsule TAKE 1 CAPSULE BY MOUTH TWICE DAILY AS NEEDED 10/05/21   [name], NP  levothyroxine (SYNTHROID) 50 MCG tablet Take 1 tablet (50 mcg total) by mouth daily before breakfast. 02/02/22   [name], NP  Omega-3 Fatty Acids (OMEGA-3 PO) Take 1 tablet by mouth daily.    [provider]    Family History Family History  Problem Relation Age of Onset   Heart disease Mother    Heart failure Mother    Heart disease Father    Prostate cancer Father    Heart disease Daughter    Diabetes Daughter    Neurologic Disorder Daughter     Social History Social History   Tobacco Use   Smoking status: Never   Smokeless tobacco: Never  Vaping Use   Vaping Use: Never used  Substance Use Topics   Alcohol use: Yes    Comment: Occasional, last drink 1-2 years ago as of 2021    Drug use: Never     Allergies   Patient has no known allergies.   Review of Systems Review of Systems Per HPI  Physical Exam Triage Vital Signs ED Triage Vitals  Enc Vitals Group     BP 02/17/22 1432 137/76  Pulse Rate 02/17/22 1432 69     Resp 02/17/22 1432 20     Temp 02/17/22 1432 98 F (36.7 C)     Temp Source 02/17/22 1429 Oral     SpO2 02/17/22 1432 96 %     Weight --      Height --      Head Circumference --      Peak Flow --      Pain Score 02/17/22 1430 0     Pain Loc --      Pain Edu? --      Excl. in [place]? --    No data found.  Updated Vital Signs BP 137/76 (BP Location: Right Arm)   Pulse 69   Temp 98 F (36.7 C) (Oral)   Resp 20   SpO2 96%   Visual Acuity Right Eye Distance:   Left Eye Distance:   Bilateral Distance:    Right Eye Near:   Left Eye Near:    Bilateral Near:     Physical Exam Vitals and nursing note reviewed.  Constitutional:      General: She is not in acute distress.    Appearance: Normal appearance. She is not toxic-appearing.  HENT:     Head:      Comments: Repaired  laceration to mid forehead; closed with 4 sutures.  Mild surrounding erythema, however no fluctuance, tenderness to palpation, or drainage.  When tension applied on the wound edges, the wound edges do come apart slightly.    Mouth/Throat:     Mouth: Mucous membranes are moist.     Pharynx: Oropharynx is clear.  Pulmonary:     Effort: Pulmonary effort is normal. No respiratory distress.  Skin:    General: Skin is warm and dry.     Capillary Refill: Capillary refill takes less than 2 seconds.  Neurological:     Mental Status: She is alert and oriented to person, place, and time.  Psychiatric:        Behavior: Behavior is cooperative.      UC Treatments / Results  Labs (all labs ordered are listed, but only abnormal results are displayed) Labs Reviewed - No data to display  EKG   Radiology No results found.  Procedures Procedures (including critical care time)  Medications Ordered in UC Medications - No data to display  Initial Impression / Assessment and Plan / UC Course  I have reviewed the triage vital signs and the nursing notes.  Pertinent labs & imaging results that were available during my care of the patient were reviewed by me and considered in my medical decision making (see chart for details).   Patient is well-appearing, normotensive, afebrile, not tachycardic, not tachypneic, oxygenating well on room air.    Encounter for removal of sutures Recommended returning in 2 days time for suture removal; I do not feel the sutures are appropriate to remove today  The patient was given the opportunity to ask questions.  All questions answered to their satisfaction.  The patient is in agreement to this plan.    Final Clinical Impressions(s) / UC Diagnoses   Final diagnoses:  Encounter for removal of sutures     Discharge Instructions      Please return in 48 hours to have the wound re-evaluated to see if the sutures are ready for removal     ED Prescriptions    None    PDMP not reviewed this encounter.   Eulogio Bear, NP 02/17/22 1513

## 2022-02-19 ENCOUNTER — Ambulatory Visit
Admission: RE | Admit: 2022-02-19 | Discharge: 2022-02-19 | Disposition: A | Discharge status: Home / Self Care | Payer: Medicare HMO | Source: Ambulatory Visit | Attending: Emergency Medicine | Admitting: Emergency Medicine

## 2022-02-19 VITALS — BP 122/63 | HR 71 | Temp 98.1°F | Resp 18

## 2022-02-19 DIAGNOSIS — Z4802 Encounter for removal of sutures: Secondary | ICD-10-CM | POA: Diagnosis not present

## 2022-02-19 NOTE — ED Triage Notes (Signed)
Fell last Saturday.  Here for suture removal from forehead.

## 2022-02-19 NOTE — Discharge Instructions (Addendum)
Sutures were removed Continue applying antibiotic ointment as directed For with PCP Return or go to ED if you develop any new or worsening of his symptoms

## 2022-02-19 NOTE — ED Provider Notes (Signed)
[facility]   [id number] 02/19/22 Arrival Time: 3557   Chief Complaint  Patient presents with   Suture / Staple Removal     SUBJECTIVE: History from: patient.  Nancy Williams is a 82 y.o. female who presents to the urgent care for complaint of suture removal.  Member reports she had a fall couple days ago and had a suture placed in the ER.  She denies any aggravating factors.  She denies chills, fever, nausea, vomiting and diarrhea    ROS: As per HPI.  All other pertinent ROS negative.     Past Medical History:  Diagnosis Date   Hypothyroidism    Hashimoto's per [facility] New Patient Packet    Neuropathy    Osteoarthritis    Per patient at New patient appointment    Seasonal allergies    Vitamin D deficiency    Past Surgical History:  Procedure Laterality Date   No previous surgery     No Known Allergies No current facility-administered medications on file prior to encounter.   Current Outpatient Medications on File Prior to Encounter  Medication Sig Dispense Refill   b complex vitamins tablet Take 1 tablet by mouth daily.     bacitracin ointment Apply 1 Application topically 2 (two) times daily. 120 g 0   Calcium Carb-Cholecalciferol (CALCIUM 500+D3 PO) Take 1 capsule by mouth daily. 200 units of vit D     Cholecalciferol (VITAMIN D3) 25 MCG (1000 UT) CAPS Take 1 capsule by mouth daily.     Cyanocobalamin (B-12) 5000 MCG CAPS Take 1 capsule by mouth daily.     fluticasone (FLONASE) 50 MCG/ACT nasal spray Place 1 spray into both nostrils as needed for allergies or rhinitis. 16 g 1   gabapentin (NEURONTIN) 100 MG capsule TAKE 1 CAPSULE BY MOUTH TWICE DAILY AS NEEDED 180 capsule 0   levothyroxine (SYNTHROID) 50 MCG tablet Take 1 tablet (50 mcg total) by mouth daily before breakfast. 90 tablet 0   Omega-3 Fatty Acids (OMEGA-3 PO) Take 1 tablet by mouth daily.     Social History   Socioeconomic History   Marital status: Widowed    Spouse name: Not on file    Number of children: Not on file   Years of education: Not on file   Highest education level: Not on file  Occupational History   Not on file  Tobacco Use   Smoking status: Never   Smokeless tobacco: Never  Vaping Use   Vaping Use: Never used  Substance and Sexual Activity   Alcohol use: Yes    Comment: Occasional, last drink 1-2 years ago as of 2021    Drug use: Never   Sexual activity: Not on file  Other Topics Concern   Not on file  Social History Narrative   Per [facility] New Patient Packet Abstracted on 08/19/2019:      Diet: Vegetarian, gluten free       Caffeine: Coffee & Tea      Married, if yes what year: Widow, 1959      Do you live in a house, apartment, assisted living, condo, trailer, ect: House      Is it one or more stories: One stories, one person       Pets: 4 cats       Current/Past profession: Left Blank      Highest level or education completed: 12 th grade       Exercise:        Try  Type and how often: Need to clarify, unable to read what patient wrote          Living Will: Yes   DNR: Yes   POA/HPOA: Yes      Functional Status:   Do you have difficulty bathing or dressing yourself? No   Do you have difficulty preparing food or eating?No   Do you have difficulty managing your medications? No   Do you have difficulty managing your finances? No   Do you have difficulty affording your medications? No         Right Handed    Lives in a one story home    Social Determinants of Health   Financial Resource Strain: Not on file  Food Insecurity: Not on file  Transportation Needs: Not on file  Physical Activity: Not on file  Stress: Not on file  Social Connections: Not on file  Intimate Partner Violence: Not on file   Family History  Problem Relation Age of Onset   Heart disease Mother    Heart failure Mother    Heart disease Father    Prostate cancer Father    Heart disease Daughter    Diabetes Daughter    Neurologic Disorder  Daughter     OBJECTIVE:  Vitals:   02/19/22 1110  BP: 122/63  Pulse: 71  Resp: 18  Temp: 98.1 F (36.7 C)  TempSrc: Oral  SpO2: 96%     Physical Exam Vitals and nursing note reviewed.  Constitutional:      General: She is not in acute distress.    Appearance: Normal appearance. She is normal weight. She is not ill-appearing, toxic-appearing or diaphoretic.  HENT:     Head: Normocephalic.  Cardiovascular:     Rate and Rhythm: Normal rate and regular rhythm.     Pulses: Normal pulses.     Heart sounds: Normal heart sounds. No murmur heard.    No friction rub. No gallop.  Pulmonary:     Effort: Pulmonary effort is normal. No respiratory distress.     Breath sounds: Normal breath sounds. No stridor. No wheezing, rhonchi or rales.  Chest:     Chest wall: No tenderness.  Skin:    Findings: No erythema.     Comments: 4 Sutures on forehead.  Neurological:     Mental Status: She is alert and oriented to person, place, and time.      LABS:  No results found for this or any previous visit (from the past 24 hour(s)).   ASSESSMENT & PLAN:  1. Encounter for removal of sutures     No orders of the defined types were placed in this encounter.  Discharge instructions  Sutures were removed Continue applying antibiotic ointment as directed For with PCP Return or go to ED if you develop any new or worsening of his symptoms  Reviewed expectations re: course of current medical issues. Questions answered. Outlined signs and symptoms indicating need for more acute intervention. Patient verbalized understanding. After Visit Summary given.          Emerson Monte, FNP 02/19/22 1135

## 2022-02-23 DIAGNOSIS — H2513 Age-related nuclear cataract, bilateral: Secondary | ICD-10-CM | POA: Diagnosis not present

## 2022-02-23 DIAGNOSIS — H35422 Microcystoid degeneration of retina, left eye: Secondary | ICD-10-CM | POA: Diagnosis not present

## 2022-02-23 DIAGNOSIS — H353134 Nonexudative age-related macular degeneration, bilateral, advanced atrophic with subfoveal involvement: Secondary | ICD-10-CM | POA: Diagnosis not present

## 2022-02-23 DIAGNOSIS — H43813 Vitreous degeneration, bilateral: Secondary | ICD-10-CM | POA: Diagnosis not present

## 2022-03-14 ENCOUNTER — Encounter: Payer: Self-pay | Admitting: Physician Assistant

## 2022-03-14 ENCOUNTER — Ambulatory Visit: Payer: Medicare HMO | Attending: Physician Assistant

## 2022-03-14 ENCOUNTER — Ambulatory Visit: Payer: Medicare HMO | Attending: Physician Assistant | Admitting: Physician Assistant

## 2022-03-14 VITALS — BP 126/66 | HR 60 | Ht 66.0 in | Wt 135.0 lb

## 2022-03-14 DIAGNOSIS — E038 Other specified hypothyroidism: Secondary | ICD-10-CM

## 2022-03-14 DIAGNOSIS — R002 Palpitations: Secondary | ICD-10-CM

## 2022-03-14 DIAGNOSIS — I952 Hypotension due to drugs: Secondary | ICD-10-CM

## 2022-03-14 DIAGNOSIS — I25700 Atherosclerosis of coronary artery bypass graft(s), unspecified, with unstable angina pectoris: Secondary | ICD-10-CM | POA: Diagnosis not present

## 2022-03-14 NOTE — Patient Instructions (Addendum)
Medication Instructions:  Your physician recommends that you continue on your current medications as directed. Please refer to the Current Medication list given to you today.   Labwork: None today  Testing/Procedures: ZIO XT- Long Term Monitor Instructions   Your physician has requested you wear your ZIO patch monitor____7___days.   This is a single patch monitor.  [employer] supplies one patch monitor per enrollment.  Additional stickers are not available.   Please do not apply patch if you will be having a Nuclear Stress Test, Echocardiogram, Cardiac CT, MRI, or Chest Xray during the time frame you would be wearing the monitor. The patch cannot be worn during these tests.  You cannot remove and re-apply the ZIO XT patch monitor.     Do not shower for the first 24 hours.  You may shower after the first 24 hours.   Press button if you feel a symptom. You will hear a small click.  Record Date, Time and Symptom in the Patient Log Book.   When you are ready to remove patch, follow instructions on last 2 pages of Patient Log Book.  Stick patch monitor onto last page of Patient Log Book.   Place Patient Log Book in [place] box.  Use locking tab on box and tape box closed securely.  The [employer] has [employer] on it.  Please place in mailbox as soon as possible.  Your physician should have your test results approximately 7 days after the monitor has been mailed back to [facility].   Call [place] at [phone] if you have questions regarding your ZIO XT patch monitor.  Call them immediately if you see an orange light blinking on your monitor.   If your monitor falls off in less than 4 days contact our Monitor department at [phone].  If your monitor becomes loose or falls off after 4 days call [employer] at [phone] for suggestions on securing your monitor.    Follow-Up: 1 year Dr.McDowell  Any Other Special Instructions Will Be Listed Below (If  Applicable).   Exercise 150 minutes a week (30 minutes, 5 days a week)   If you need a refill on your cardiac medications before your next appointment, please call your pharmacy.

## 2022-03-14 NOTE — Progress Notes (Signed)
Cardiology Office Note:    Date:  03/14/2022   ID:  Nancy Williams, DOB 02-Sep-1940, MRN [id number]  PCP:  [name], NP  [place] [facility] Cardiologist:  Rozann Lesches, MD     Referring MD: [name], NP   Chief Complaint:  Follow-up     History of Present Illness:   Nancy Williams is a 82 y.o. female  with past medical history of hypothyroidism, osteoarthritis and palpitations (s/p monitor in 05/2019 showing brief SVT and 4% PVC burden).  Patient last saw Ms Ahmed Prima 01/2021 and having atypical chest pain and progressive fatigue. Because of strong gamily history of CAD a Coronary CTA was ordered. Also having more palpitations so Toprol XL increased 25 mg daily. Calcium score 21 and mild nonobstructive CAD.  She called in 01/2021 having low BP's and stopped Toprol and started Magnesium for palpitations. She was in ED with fall and head laceration 02/12/22-tripped while carrying a box down steps. When she gets up in the am she gets a jittery feeling, weak and tired. Improves as the day goes on. Balance has been off. She asked her PCP to lower her synthroid. She's been to functional doctors, [facility], neurology and many physicians. She used to dance but can't do anything. Walks 1/4 mile 2-3 days/week. Was taking gabapentin prn but now taking twice daily and seems to be helping.       Past Medical History:  Diagnosis Date   Hypothyroidism    Hashimoto's per [facility] New Patient Packet    Neuropathy    Osteoarthritis    Per patient at New patient appointment    Seasonal allergies    Vitamin D deficiency    Current Medications: Current Meds  Medication Sig   b complex vitamins tablet Take 1 tablet by mouth daily.   bacitracin ointment Apply 1 Application topically 2 (two) times daily.   Calcium Carb-Cholecalciferol (CALCIUM 500+D3 PO) Take 1 capsule by mouth daily. 200 units of vit D   Cholecalciferol (VITAMIN D3) 25 MCG (1000 UT) CAPS  Take 1 capsule by mouth daily.   Cyanocobalamin (B-12) 5000 MCG CAPS Take 1 capsule by mouth daily.   fluticasone (FLONASE) 50 MCG/ACT nasal spray Place 1 spray into both nostrils as needed for allergies or rhinitis.   gabapentin (NEURONTIN) 100 MG capsule TAKE 1 CAPSULE BY MOUTH TWICE DAILY AS NEEDED   levothyroxine (SYNTHROID) 50 MCG tablet Take 1 tablet (50 mcg total) by mouth daily before breakfast.   Omega-3 Fatty Acids (OMEGA-3 PO) Take 1 tablet by mouth daily.    Allergies:   Patient has no known allergies.   Social History   Tobacco Use   Smoking status: Never   Smokeless tobacco: Never  Vaping Use   Vaping Use: Never used  Substance Use Topics   Alcohol use: Yes    Comment: Occasional, last drink 1-2 years ago as of 2021    Drug use: Never    Family Hx: The patient's family history includes Diabetes in her daughter; Heart disease in her daughter, father, and mother; Heart failure in her mother; Neurologic Disorder in her daughter; Prostate cancer in her father.  ROS     Physical Exam:    VS:  BP 126/66   Pulse 60   Ht 5' 6"$  (1.676 m)   Wt 135 lb (61.2 kg)   SpO2 96%   BMI 21.79 kg/m     Wt Readings from Last 3 Encounters:  03/14/22 135  lb (61.2 kg)  02/12/22 135 lb (61.2 kg)  02/02/22 135 lb (61.2 kg)    Physical Exam  GEN: Thin, in no acute distress  Neck: no JVD, carotid bruits, or masses Cardiac:RRR; no murmurs, rubs, or gallops  Respiratory:  clear to auscultation bilaterally, normal work of breathing GI: soft, nontender, nondistended, + BS Ext: without cyanosis, clubbing, or edema, Good distal pulses bilaterally Neuro:  Alert and Oriented x 3,  Psych: euthymic mood, full affect        EKGs/Labs/Other Test Reviewed:    EKG:  EKG is   ordered today.  The ekg ordered today demonstrates NSR poor ant R wave progression  Recent Labs: 03/29/2021: ALT 14 10/27/2021: TSH 1.43 02/12/2022: BUN 26; Creatinine, Ser 0.76; Hemoglobin 13.9; Platelets 285;  Potassium 4.7; Sodium 137   Recent Lipid Panel Recent Labs    03/29/21 1345  CHOL 215*  TRIG 130  HDL 94  LDLCALC 98     Prior CV Studies:   Coronary CTA 03/2021 Calcium Score: 21.4 Agatston units.   Coronary Arteries: Right dominant with no anomalies   LM: No plaque or stenosis.   LAD system: Calcified plaque mid LAD, mild (25-49%) stenosis.   Circumflex system: No plaque or stenosis.   RCA system: Misregistration artifact mid RCA but no plaque or stenosis noted.   IMPRESSION: 1. Coronary artery calcium score 21.4 Agatston units. This places the patient in the 23rd percentile for age and gender, suggesting low risk for future cardiac events.   2.  Mild nonobstructive CAD.   Loralie Champagne    Echocardiogram: 05/2019 IMPRESSIONS     1. Left ventricular ejection fraction, by estimation, is 60 to 65%. The  left ventricle has normal function. The left ventricle has no regional  wall motion abnormalities. Left ventricular diastolic parameters were  normal.   2. Right ventricular systolic function is normal. The right ventricular  size is normal. Tricuspid regurgitation signal is inadequate for assessing  PA pressure.   3. Left atrial size was upper normal.   4. The mitral valve is grossly normal. Trivial mitral valve  regurgitation.   5. The aortic valve is tricuspid. Aortic valve regurgitation is not  visualized.   6. The inferior vena cava is normal in size with greater than 50%  respiratory variability, suggesting right atrial pressure of 3 mmHg.    Holter Monitor: 05/2019 ZIO XT reviewed.  2 days 19 hours analyzed.  Predominant rhythm is sinus with heart rate ranging from 47 bpm up to 120 bpm and average heart rate 70 bpm.  Rare PACs were noted representing less than 1% of total beats.  There were occasional PVCs as well representing approximately 4% of total beats.  Brief bursts of SVT noted, the longest was only 6 beats.  There were no sustained arrhythmias or  pauses.  No specific ectopy corresponded to patient triggered event other than PVC.   Risk Assessment/Calculations/Metrics:              ASSESSMENT & PLAN:   No problem-specific Assessment & Plan notes found for this encounter.   Palpitations/SVT-Toprol stopped by patient b/c of low BP's-doesn't think it ever helped. Still has jittery sensation in her chest daily when she wakes up. Makes her feel poorly the rest of the day. Has been to multiple physicians and takes multiple supplements. Will place 7 day Zio. Encourage her to see endocrinologist.  CAD mild nonobstructive  Hypothyroidism taking synthroid and I thyroid from function medicine. For blood  work later this month. Encourage her to see endocrinologist.   Hypotension has improved off metoprolol            Dispo:  No follow-ups on file.   Medication Adjustments/Labs and Tests Ordered: Current medicines are reviewed at length with the patient today.  Concerns regarding medicines are outlined above.  Tests Ordered: Orders Placed This Encounter  Procedures   LONG TERM MONITOR (3-14 DAYS)   EKG 12-Lead   Medication Changes: No orders of the defined types were placed in this encounter.  Sumner Boast, PA-C  03/14/2022 1:50 PM    [facility] [place], [place], [place]  [address] Phone: [phone]; Fax: [phone]

## 2022-03-15 ENCOUNTER — Other Ambulatory Visit: Payer: Self-pay | Admitting: Nurse Practitioner

## 2022-03-15 ENCOUNTER — Encounter: Payer: Self-pay | Admitting: Cardiology

## 2022-03-15 ENCOUNTER — Encounter: Payer: Self-pay | Admitting: Nurse Practitioner

## 2022-03-15 DIAGNOSIS — R002 Palpitations: Secondary | ICD-10-CM

## 2022-03-15 NOTE — Telephone Encounter (Signed)
error 

## 2022-03-28 ENCOUNTER — Telehealth: Payer: Self-pay | Admitting: Cardiology

## 2022-03-28 ENCOUNTER — Encounter: Payer: Self-pay | Admitting: Cardiology

## 2022-03-28 DIAGNOSIS — R002 Palpitations: Secondary | ICD-10-CM | POA: Diagnosis not present

## 2022-03-28 NOTE — Telephone Encounter (Signed)
Spoke with Nancy Williams at [facility]. She reports that the pt wore monitor from 03/14/22 until 03/21/22. She noted that there was 11 runs of SVT and the fastest lasting 1 min 41 secs. Please advise. Will send to Dr. Domenic Polite and Ermalinda Barrios, PA.

## 2022-03-28 NOTE — Telephone Encounter (Signed)
Raquel Sarna is calling to report abnormal results for the heart monitor ordered by Ermalinda Barrios, PA. Call transferred to RN.

## 2022-03-29 ENCOUNTER — Telehealth: Payer: Self-pay

## 2022-03-29 MED ORDER — METOPROLOL TARTRATE 25 MG PO TABS: 12.5000 mg | ORAL_TABLET | Freq: Two times a day (BID) | ORAL | 3 refills | Status: DC

## 2022-03-29 NOTE — Telephone Encounter (Signed)
Monitor results discussed with patient. She agrees to start metoprolol tartrate 12.5 mg BID and would like a 30 day supply to start. She will let us know if she wants a 90 day supply going forward .

## 2022-03-29 NOTE — Telephone Encounter (Signed)
-----   Message from Imogene Burn, [place] sent at 03/29/2022  7:37 AM EST ----- Patient stopped her toprol xl 25 mg because she didn't think it was helping and low BP's. Recurrent SVT. Lets try metoprolol 25 mg 1/2 bid to see if this helps   I sent this yesterday but don't see that she's been called.

## 2022-03-31 ENCOUNTER — Other Ambulatory Visit: Payer: Medicare HMO

## 2022-03-31 DIAGNOSIS — E038 Other specified hypothyroidism: Secondary | ICD-10-CM | POA: Diagnosis not present

## 2022-03-31 DIAGNOSIS — Z789 Other specified health status: Secondary | ICD-10-CM

## 2022-03-31 DIAGNOSIS — R002 Palpitations: Secondary | ICD-10-CM | POA: Diagnosis not present

## 2022-03-31 DIAGNOSIS — E063 Autoimmune thyroiditis: Secondary | ICD-10-CM | POA: Diagnosis not present

## 2022-04-01 ENCOUNTER — Encounter: Payer: Self-pay | Admitting: Nurse Practitioner

## 2022-04-01 DIAGNOSIS — R69 Illness, unspecified: Secondary | ICD-10-CM | POA: Diagnosis not present

## 2022-04-01 LAB — COMPLETE METABOLIC PANEL WITH GFR
AG Ratio: 1.5 (calc) (ref 1.0–2.5)
ALT: 12 U/L (ref 6–29)
AST: 21 U/L (ref 10–35)
Albumin: 4.1 g/dL (ref 3.6–5.1)
Alkaline phosphatase (APISO): 64 U/L (ref 37–153)
BUN/Creatinine Ratio: 38 (calc) — ABNORMAL HIGH (ref 6–22)
BUN: 27 mg/dL — ABNORMAL HIGH (ref 7–25)
CO2: 24 mmol/L (ref 20–32)
Calcium: 9.1 mg/dL (ref 8.6–10.4)
Chloride: 103 mmol/L (ref 98–110)
Creat: 0.71 mg/dL (ref 0.60–0.95)
Globulin: 2.8 g/dL (calc) (ref 1.9–3.7)
Glucose, Bld: 100 mg/dL — ABNORMAL HIGH (ref 65–99)
Potassium: 4.9 mmol/L (ref 3.5–5.3)
Sodium: 139 mmol/L (ref 135–146)
Total Bilirubin: 0.3 mg/dL (ref 0.2–1.2)
Total Protein: 6.9 g/dL (ref 6.1–8.1)
eGFR: 84 mL/min/{1.73_m2} (ref >=60)

## 2022-04-01 LAB — CBC WITH DIFFERENTIAL/PLATELET
Absolute Monocytes: 888 cells/uL (ref 200–950)
Basophils Absolute: 50 cells/uL (ref 0–200)
Basophils Relative: 0.6 %
Eosinophils Absolute: 340 cells/uL (ref 15–500)
Eosinophils Relative: 4.1 %
HCT: 38.6 % (ref 35.0–45.0)
Hemoglobin: 12.9 g/dL (ref 11.7–15.5)
Lymphs Abs: 2764 cells/uL (ref 850–3900)
MCH: 29.8 pg (ref 27.0–33.0)
MCHC: 33.4 g/dL (ref 32.0–36.0)
MCV: 89.1 fL (ref 80.0–100.0)
MPV: 11.2 fL (ref 7.5–12.5)
Monocytes Relative: 10.7 %
Neutro Abs: 4258 cells/uL (ref 1500–7800)
Neutrophils Relative %: 51.3 %
Platelets: 250 10*3/uL (ref 140–400)
RBC: 4.33 10*6/uL (ref 3.80–5.10)
RDW: 13 % (ref 11.0–15.0)
Total Lymphocyte: 33.3 %
WBC: 8.3 10*3/uL (ref 3.8–10.8)

## 2022-04-01 LAB — MAGNESIUM: Magnesium: 2.2 mg/dL (ref 1.5–2.5)

## 2022-04-01 LAB — TSH: TSH: 3.57 mIU/L (ref 0.40–4.50)

## 2022-04-01 NOTE — Telephone Encounter (Signed)
Message routed to PCP Dewaine Oats, Carlos American, NP

## 2022-04-18 DIAGNOSIS — L03032 Cellulitis of left toe: Secondary | ICD-10-CM | POA: Diagnosis not present

## 2022-04-18 DIAGNOSIS — M79672 Pain in left foot: Secondary | ICD-10-CM | POA: Diagnosis not present

## 2022-04-18 DIAGNOSIS — M79675 Pain in left toe(s): Secondary | ICD-10-CM | POA: Diagnosis not present

## 2022-04-18 DIAGNOSIS — L600 Ingrowing nail: Secondary | ICD-10-CM | POA: Diagnosis not present

## 2022-04-20 DIAGNOSIS — H35422 Microcystoid degeneration of retina, left eye: Secondary | ICD-10-CM | POA: Diagnosis not present

## 2022-04-20 DIAGNOSIS — H43813 Vitreous degeneration, bilateral: Secondary | ICD-10-CM | POA: Diagnosis not present

## 2022-04-20 DIAGNOSIS — H353134 Nonexudative age-related macular degeneration, bilateral, advanced atrophic with subfoveal involvement: Secondary | ICD-10-CM | POA: Diagnosis not present

## 2022-04-20 DIAGNOSIS — H2513 Age-related nuclear cataract, bilateral: Secondary | ICD-10-CM | POA: Diagnosis not present

## 2022-04-25 ENCOUNTER — Other Ambulatory Visit: Payer: Self-pay | Admitting: Nurse Practitioner

## 2022-04-25 DIAGNOSIS — E038 Other specified hypothyroidism: Secondary | ICD-10-CM

## 2022-05-02 DIAGNOSIS — R69 Illness, unspecified: Secondary | ICD-10-CM | POA: Diagnosis not present

## 2022-05-06 ENCOUNTER — Encounter: Payer: Self-pay | Admitting: Nurse Practitioner

## 2022-05-23 ENCOUNTER — Encounter: Payer: Self-pay | Admitting: Nurse Practitioner

## 2022-05-24 ENCOUNTER — Other Ambulatory Visit: Payer: Self-pay

## 2022-05-24 DIAGNOSIS — E038 Other specified hypothyroidism: Secondary | ICD-10-CM

## 2022-05-24 MED ORDER — LEVOTHYROXINE SODIUM 50 MCG PO TABS: 50.0000 ug | ORAL_TABLET | Freq: Every day | ORAL | 1 refills | Status: DC

## 2022-06-18 ENCOUNTER — Encounter: Payer: Self-pay | Admitting: Cardiology

## 2022-06-20 ENCOUNTER — Other Ambulatory Visit: Payer: Self-pay

## 2022-06-20 MED ORDER — METOPROLOL TARTRATE 25 MG PO TABS: 12.5000 mg | ORAL_TABLET | Freq: Two times a day (BID) | ORAL | 3 refills | Status: DC

## 2022-06-21 DIAGNOSIS — H2513 Age-related nuclear cataract, bilateral: Secondary | ICD-10-CM | POA: Diagnosis not present

## 2022-06-21 DIAGNOSIS — H353134 Nonexudative age-related macular degeneration, bilateral, advanced atrophic with subfoveal involvement: Secondary | ICD-10-CM | POA: Diagnosis not present

## 2022-06-21 DIAGNOSIS — H43813 Vitreous degeneration, bilateral: Secondary | ICD-10-CM | POA: Diagnosis not present

## 2022-06-21 DIAGNOSIS — H35422 Microcystoid degeneration of retina, left eye: Secondary | ICD-10-CM | POA: Diagnosis not present

## 2022-06-22 DIAGNOSIS — M19011 Primary osteoarthritis, right shoulder: Secondary | ICD-10-CM | POA: Diagnosis not present

## 2022-08-05 ENCOUNTER — Ambulatory Visit: Payer: Medicare HMO | Admitting: Nurse Practitioner

## 2022-08-06 ENCOUNTER — Encounter: Payer: Self-pay | Admitting: Nurse Practitioner

## 2022-08-08 NOTE — Telephone Encounter (Signed)
Okay to cancel, will look at things her next appt

## 2022-08-12 ENCOUNTER — Ambulatory Visit: Payer: Medicare HMO | Admitting: Nurse Practitioner

## 2022-08-16 DIAGNOSIS — H353134 Nonexudative age-related macular degeneration, bilateral, advanced atrophic with subfoveal involvement: Secondary | ICD-10-CM | POA: Diagnosis not present

## 2022-08-16 DIAGNOSIS — H35422 Microcystoid degeneration of retina, left eye: Secondary | ICD-10-CM | POA: Diagnosis not present

## 2022-08-16 DIAGNOSIS — H43813 Vitreous degeneration, bilateral: Secondary | ICD-10-CM | POA: Diagnosis not present

## 2022-10-10 ENCOUNTER — Encounter: Payer: Medicare HMO | Admitting: Nurse Practitioner

## 2022-10-10 ENCOUNTER — Encounter: Payer: Self-pay | Admitting: Nurse Practitioner

## 2022-10-10 ENCOUNTER — Ambulatory Visit (INDEPENDENT_AMBULATORY_CARE_PROVIDER_SITE_OTHER): Payer: Medicare HMO | Admitting: Nurse Practitioner

## 2022-10-10 ENCOUNTER — Other Ambulatory Visit: Payer: Self-pay | Admitting: Nurse Practitioner

## 2022-10-10 VITALS — BP 112/60 | HR 72 | Temp 96.8°F | Ht 66.0 in | Wt 134.0 lb

## 2022-10-10 DIAGNOSIS — G629 Polyneuropathy, unspecified: Secondary | ICD-10-CM | POA: Diagnosis not present

## 2022-10-10 DIAGNOSIS — E038 Other specified hypothyroidism: Secondary | ICD-10-CM | POA: Diagnosis not present

## 2022-10-10 DIAGNOSIS — R2689 Other abnormalities of gait and mobility: Secondary | ICD-10-CM | POA: Diagnosis not present

## 2022-10-10 DIAGNOSIS — R5382 Chronic fatigue, unspecified: Secondary | ICD-10-CM

## 2022-10-10 DIAGNOSIS — E063 Autoimmune thyroiditis: Secondary | ICD-10-CM | POA: Diagnosis not present

## 2022-10-10 NOTE — Progress Notes (Signed)
Careteam: Patient Care Team: Sharon Seller, NP as PCP - General (Geriatric Medicine) Jonelle Sidle, MD as PCP - Cardiology (Cardiology) Marcelino Duster, MD as Referring Physician (Dermatology) Daisy Lazar, DO (Optometry) Newman Pies, MD as Consulting Physician (Otolaryngology) Glendale Chard, DO as Consulting Physician (Neurology)  PLACE OF SERVICE:  [facility]  Advanced Directive information Does Patient Have a Medical Advance Directive?: Yes, Type of Advance Directive: Living will, Does patient want to make changes to medical advance directive?: No - Patient declined  No Known Allergies  Chief Complaint  Patient presents with   Acute Visit    Discuss weakness, balance issues , and fatigue. Here with daughter Verlon Au      HPI: Patient is a 82 y.o. female presents for balance issues and fatigue.  Patient accompanied by daughter. They state that patient has had balance issues for about 3 years and she has seen many specialists without finding a cause. She has had many thorough work-ups and they have been negative.  She was tested for heavy metals through functional medicine who did find high levels of heavy metals and mold. She had a detox which she thinks slightly improved her issues, but the detox made her sick. She has periods where she also "zones out" and has no energy. When she walks she feels like she's stumbling. She denies having any symptoms when she's sitting or laying down. She describes feeling "weak and jittery" every morning. It does subside as the day goes by.  She has checked her CBGs/BP when she's not feeling well and they have been fine so they do not think that's the problem. Unfortunately, she also had a fall in January 2024 due to these issues.  She's now not socializing due to her balance issues and fatigue.  Daughter endorses that patient denies stress and/or anxiety/depression, but that she may be internalizing these feelings. Has taking paxil  and valium in the past but patient is not interested in taking medication for anxiety. Question if anxiety does not contribute to some of her symptoms.   She eats well; she's a vegetarian, does not eat meat or fish and uses organic ingredients when baking. Tries to walk a mile every day. Able to do tai-chi as well.   Due to her balance issues would like to see movement disorder specialist.  Was seen by a neurologist in the past but did not work up her issues with mobility.  Wants referral to see [place] neurologist that works with mobility, Dr. Arbutus Leas.  Review of Systems:  Review of Systems  Constitutional:  Negative for chills, fever and weight loss.  HENT:  Negative for hearing loss and tinnitus.   Respiratory:  Negative for cough and shortness of breath.   Cardiovascular:  Negative for chest pain and palpitations.  Gastrointestinal:  Negative for abdominal pain, constipation, diarrhea, nausea and vomiting.  Genitourinary:  Negative for dysuria, frequency and urgency.  Neurological:  Positive for weakness. Negative for dizziness.       Balance issues  Psychiatric/Behavioral:  Negative for depression. The patient is not nervous/anxious and does not have insomnia.     Past Medical History:  Diagnosis Date   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    Hypothyroidism    Hashimoto's per [facility] New Patient Packet    Neuropathy    Osteoarthritis    Per patient at New patient appointment    Seasonal allergies    Vitamin D deficiency    Past Surgical  History:  Procedure Laterality Date   No previous surgery     Social History:   reports that she has never smoked. She has never used smokeless tobacco. She reports current alcohol use. She reports that she does not use drugs.  Family History  Problem Relation Age of Onset   Heart disease Mother    Heart failure Mother    Heart disease Father    Prostate cancer Father    Heart disease Son    Diabetes Son     Prostate cancer Son    Thyroid disease Son    Hashimoto's thyroiditis Daughter     Medications: Patient's Medications  New Prescriptions   No medications on file  Previous Medications   B COMPLEX VITAMINS TABLET    Take 1 tablet by mouth daily.   CALCIUM CARB-CHOLECALCIFEROL (CALCIUM 500+D3 PO)    Take 1 capsule by mouth daily. 200 units of vit D   [employer] OMEGA-3 KRILL OIL PO    Take 1 capsule by mouth daily.   FLUTICASONE (FLONASE) 50 MCG/ACT NASAL SPRAY    USE 1 SPRAY(S) IN EACH NOSTRIL AS NEEDED FOR  ALLERGIES  OR  RHINITIS   GABAPENTIN (NEURONTIN) 100 MG CAPSULE    Take 100 mg by mouth as needed.   LEVOTHYROXINE (SYNTHROID) 50 MCG TABLET    Take 1 tablet (50 mcg total) by mouth daily before breakfast.   MAGNESIUM GLYCINATE 120 MG CAPS    Take 1 capsule by mouth daily.   MULTIPLE VITAMINS-MINERALS (PRESERVISION AREDS 2) CAPS    Take 1 capsule by mouth daily.   UNABLE TO FIND    Med Name: methylated  completed once daily   UNABLE TO FIND    Take 1 capsule by mouth daily. Med Name: Cell Salt   VITAMIN D-VITAMIN K PO    Take 1 capsule by mouth daily.  Modified Medications   No medications on file  Discontinued Medications   BACITRACIN OINTMENT    Apply 1 Application topically 2 (two) times daily.   CHOLECALCIFEROL (VITAMIN D3) 25 MCG (1000 UT) CAPS    Take 1 capsule by mouth daily.   CYANOCOBALAMIN (B-12) 5000 MCG CAPS    Take 1 capsule by mouth daily.   GABAPENTIN (NEURONTIN) 100 MG CAPSULE    TAKE 1 CAPSULE TWICE DAILY   MAGNESIUM PO    Take 1 capsule by mouth daily. Dose unknown   METOPROLOL TARTRATE (LOPRESSOR) 25 MG TABLET    Take 0.5 tablets (12.5 mg total) by mouth 2 (two) times daily.   OMEGA-3 FATTY ACIDS (OMEGA-3 PO)    Take 1 tablet by mouth daily.    Physical Exam:  Vitals:   10/10/22 1323  BP: 112/60  Pulse: 72  Temp: (!) 96.8 F (36 C)  TempSrc: Temporal  SpO2: 97%  Weight: 60.8 kg  Height: 5\' 6"  (1.676 m)   Body mass index is 21.63 kg/m. Wt Readings from  Last 3 Encounters:  10/10/22 60.8 kg  03/14/22 61.2 kg  02/12/22 61.2 kg    Physical Exam Constitutional:      Appearance: Normal appearance.  Cardiovascular:     Rate and Rhythm: Normal rate and regular rhythm.     Pulses: Normal pulses.     Heart sounds: Normal heart sounds.  Pulmonary:     Effort: Pulmonary effort is normal.     Breath sounds: Normal breath sounds.  Abdominal:     General: Bowel sounds are normal.     Palpations: Abdomen  is soft.  Musculoskeletal:     Comments: Rigidity noted to upper extremities.  Abnormal gait- leaning back and lack of arm swing when walking  Skin:    General: Skin is warm and dry.  Neurological:     Mental Status: She is alert and oriented to person, place, and time. Mental status is at baseline.     Gait: Gait abnormal.  Psychiatric:        Mood and Affect: Mood normal.        Behavior: Behavior normal.     Labs reviewed: Basic Metabolic Panel: Recent Labs    10/27/21 0957 02/12/22 1154 03/31/22 1332  NA  --  137 139  K  --  4.7 4.9  CL  --  103 103  CO2  --  26 24  GLUCOSE  --  128* 100*  BUN  --  26* 27*  CREATININE  --  0.76 0.71  CALCIUM  --  9.3 9.1  MG  --   --  2.2  TSH 1.43  --  3.57   Liver Function Tests: Recent Labs    03/31/22 1332  AST 21  ALT 12  BILITOT 0.3  PROT 6.9   No results for input(s): "LIPASE", "AMYLASE" in the last 8760 hours. No results for input(s): "AMMONIA" in the last 8760 hours. CBC: Recent Labs    10/27/21 0957 02/12/22 1154 03/31/22 1332  WBC 9.7 12.7* 8.3  NEUTROABS 6,063  --  4,258  HGB 13.6 13.9 12.9  HCT 41.2 43.3 38.6  MCV 90.2 93.3 89.1  PLT 283 285 250   Lipid Panel: No results for input(s): "CHOL", "HDL", "LDLCALC", "TRIG", "CHOLHDL", "LDLDIRECT" in the last 8760 hours. TSH: Recent Labs    10/27/21 0957 03/31/22 1332  TSH 1.43 3.57   A1C: Lab Results  Component Value Date   HGBA1C 5.5 10/27/2021     Assessment/Plan 1. Imbalance - Ambulatory  referral to Neurology for further workup of movement disorder Also has parkinsonism like symptoms without tremor She wants to have neurology workup prior to any additional intervention.   2. Chronic fatigue - Ambulatory referral to Neurology - COMPLETE METABOLIC PANEL WITH GFR - CBC with Differential/Platelet -TSH -question if anxiety is not contributing to chronic fatigue. Will await neurology evaluation at this time  3. Neuropathy -mild, not taking gabapentin at this time  4. Hypothyroidism due to Hashimoto's thyroiditis - TSH -Continue levothyroxine/Synthroid   To follow up after neurology work up.   Rollen Sox, FNP-MSN Student -I personally was present during the history, physical exam and medical decision-making activities of this service and have verified that the service and findings are accurately documented in the student's note Abbey Chatters, NP

## 2022-10-11 LAB — CBC/DIFF AMBIGUOUS DEFAULT
Basophils Absolute: 0 10*3/uL (ref 0.0–0.2)
Basos: 1 %
EOS (ABSOLUTE): 0.3 10*3/uL (ref 0.0–0.4)
Eos: 3 %
Hematocrit: 40.6 % (ref 34.0–46.6)
Hemoglobin: 13.4 g/dL (ref 11.1–15.9)
Immature Grans (Abs): 0 10*3/uL (ref 0.0–0.1)
Immature Granulocytes: 0 %
Lymphocytes Absolute: 2.9 10*3/uL (ref 0.7–3.1)
Lymphs: 35 %
MCH: 30.2 pg (ref 26.6–33.0)
MCHC: 33 g/dL (ref 31.5–35.7)
MCV: 92 fL (ref 79–97)
Monocytes Absolute: 1 10*3/uL — ABNORMAL HIGH (ref 0.1–0.9)
Monocytes: 12 %
Neutrophils Absolute: 4.2 10*3/uL (ref 1.4–7.0)
Neutrophils: 49 %
Platelets: 285 10*3/uL (ref 150–450)
RBC: 4.43 x10E6/uL (ref 3.77–5.28)
RDW: 12.6 % (ref 11.7–15.4)
WBC: 8.4 10*3/uL (ref 3.4–10.8)

## 2022-10-11 LAB — COMPREHENSIVE METABOLIC PANEL
ALT: 12 IU/L (ref 0–32)
AST: 21 IU/L (ref 0–40)
Albumin: 4.2 g/dL (ref 3.7–4.7)
Alkaline Phosphatase: 86 IU/L (ref 44–121)
BUN/Creatinine Ratio: 35 — ABNORMAL HIGH (ref 12–28)
BUN: 24 mg/dL (ref 8–27)
Bilirubin Total: <0.2 mg/dL (ref 0.0–1.2)
CO2: 24 mmol/L (ref 20–29)
Calcium: 9.4 mg/dL (ref 8.7–10.3)
Chloride: 102 mmol/L (ref 96–106)
Creatinine, Ser: 0.68 mg/dL (ref 0.57–1.00)
Globulin, Total: 2.4 g/dL (ref 1.5–4.5)
Glucose: 83 mg/dL (ref 70–99)
Potassium: 4.8 mmol/L (ref 3.5–5.2)
Sodium: 141 mmol/L (ref 134–144)
Total Protein: 6.6 g/dL (ref 6.0–8.5)
eGFR: 86 mL/min/{1.73_m2} (ref >59)

## 2022-10-11 LAB — SPECIMEN STATUS REPORT

## 2022-10-11 LAB — TSH: TSH: 5.82 u[IU]/mL — ABNORMAL HIGH (ref 0.450–4.500)

## 2022-10-12 ENCOUNTER — Encounter: Payer: Self-pay | Admitting: Nurse Practitioner

## 2022-10-12 ENCOUNTER — Telehealth: Payer: Self-pay

## 2022-10-12 NOTE — Telephone Encounter (Signed)
-----   Message from Sharon Seller sent at 10/12/2022  2:32 PM EDT ----- Blood counts, electrolytes, glucose, liver and kidney function are normal Thyroid sightly elevated but still considered normal for age.

## 2022-10-24 ENCOUNTER — Encounter: Payer: Self-pay | Admitting: Nurse Practitioner

## 2022-10-24 ENCOUNTER — Encounter: Payer: Medicare HMO | Admitting: Nurse Practitioner

## 2022-10-24 NOTE — Progress Notes (Unsigned)
This encounter was created in error - please disregard.

## 2022-10-24 NOTE — Progress Notes (Unsigned)
This service is provided via telemedicine  No vital signs collected/recorded due to the encounter was a telemedicine visit.   Location of patient (ex: home, work):  Home  Patient consents to a telephone visit: Yes  Location of the provider (ex: office, home):  [facility], Office   Name of any referring provider:  N/A  Names of all persons participating in the telemedicine service and their role in the encounter:  S.Chrae B/CMA, Abbey Chatters, NP, and Patient   Time spent on call:  6 min with medical assistant

## 2022-10-26 DIAGNOSIS — L719 Rosacea, unspecified: Secondary | ICD-10-CM | POA: Diagnosis not present

## 2022-10-26 DIAGNOSIS — Z85828 Personal history of other malignant neoplasm of skin: Secondary | ICD-10-CM | POA: Diagnosis not present

## 2022-10-31 ENCOUNTER — Ambulatory Visit: Payer: Medicare HMO | Admitting: Neurology

## 2022-10-31 ENCOUNTER — Encounter: Payer: Self-pay | Admitting: Neurology

## 2022-10-31 VITALS — BP 128/71 | HR 84 | Ht 66.0 in | Wt 133.0 lb

## 2022-10-31 DIAGNOSIS — R2681 Unsteadiness on feet: Secondary | ICD-10-CM | POA: Diagnosis not present

## 2022-10-31 NOTE — Progress Notes (Signed)
Follow-up Visit   Date: 10/31/2022    Nancy Williams MRN: [id number] DOB: [birth year]    Nancy Williams is a 82 y.o. right-handed Caucasian female with hypothyroidism, palpitations, and left carpal tunnel syndrome returning to the clinic for follow-up of neuropathy and gait instability.  The patient was accompanied to the clinic by daughter who also provides collateral information.    IMPRESSION/PLAN: Unsteady gait, unclear etiology.  She has mild neuropathy distally in the feet, which would not cause this degree of unsteadiness.  MRI lumbar spine was reviewed and shows mild degenerative changes, none which would explain her imbalance.  MRI brain was personally viewed and looks great, no evidence of NPH or cerebellar atrophy. Exam does not show any neurodegenerative condition, such as parkinson's.   I explained that I do not have a good answer for her symptoms.  Management may be supportive, such that I emphasized fall prevention and balance training.  She is interested in PT for balance.  I also recommend adding grab bars in the shower and that she use a rollator for stability.   --------------------------------------------- History of present illness: She was diagnosed with neuropathy 18 years ago manifesting with burning pain.  She was recommended to wear good shoes which alleviated her burning pain.  She now has numbness in the soles of the feet, but no tingling or pain.   Over the past 6 months, she has noticed greater problems with imbalance which has progressively been getting worse.  She walks unassisted, but tends to lean on things for support.  She has not suffered any falls.  She lives alone in a one-level home.  She retired from office work.  Her drives, manages medication and finances, and does her own household chores.    Her daughter also says that she has spells where she appears with a blank stare.  Her memory is not as great and for the past 2 years.  Patient feels that she is  not "right", but unable to find a reason for this.  They would like MRI brain.   UPDATE 10/31/2022:  She complains unsteadiness for the past 3 years.  There is no dizziness or spinning. She had one fall climbing stairs down, but reports that she was holding items in her hand.  She walks unassisted but always feels like she is swaying towards the left.  Symptoms are constant.  She does not use gait assist device.  She does endorse holding on to walls/furniture.  Despite this, she walks 1 mile daily. She denies leg weakness, neck pain, back pain, or joint pain.  She has mild numbness in the soles of the feet.   Medications:  Current Outpatient Medications on File Prior to Visit  Medication Sig Dispense Refill   b complex vitamins tablet Take 1 tablet by mouth daily.     Calcium Carb-Cholecalciferol (CALCIUM 500+D3 PO) Take 1 capsule by mouth daily. 200 units of vit D     [employer] OMEGA-3 KRILL OIL PO Take 1 capsule by mouth daily.     fluticasone (FLONASE) 50 MCG/ACT nasal spray USE 1 SPRAY(S) IN EACH NOSTRIL AS NEEDED FOR  ALLERGIES  OR  RHINITIS 16 g 0   gabapentin (NEURONTIN) 100 MG capsule Take 100 mg by mouth as needed.     levothyroxine (SYNTHROID) 50 MCG tablet Take 1 tablet (50 mcg total) by mouth daily before breakfast. 90 tablet 1   Magnesium Glycinate 120 MG CAPS Take 1 capsule by mouth daily.  Multiple Vitamins-Minerals (PRESERVISION AREDS 2) CAPS Take 1 capsule by mouth daily.     UNABLE TO FIND Med Name: methylated  completed once daily     UNABLE TO FIND Take 1 capsule by mouth daily. Med Name: Cell Salt     VITAMIN D-VITAMIN K PO Take 1 capsule by mouth daily.     No current facility-administered medications on file prior to visit.    Allergies: No Known Allergies  Vital Signs:  BP 128/71   Pulse 84   Ht 5\' 6"  (1.676 m)   Wt 133 lb (60.3 kg)   SpO2 95%   BMI 21.47 kg/m   Neurological Exam: MENTAL STATUS including orientation to time, place, person, recent and remote  memory, attention span and concentration, language, and fund of knowledge is normal.  Speech is not dysarthric.  CRANIAL NERVES:  No visual field defects.  Pupils equal round and reactive to light.  Normal conjugate, extra-ocular eye movements in all directions of gaze.  No ptosis.  Face is symmetric. Palate elevates symmetrically.  Tongue is midline.  MOTOR:  Motor strength is 5/5 in all extremities, including distally in the feet.  No atrophy, fasciculations or abnormal movements.  No pronator drift.  Tone is normal.   MSRs:                                           Right        Left brachioradialis 2+  2+  biceps 2+  2+  triceps 2+  2+  patellar 3+  3+  ankle jerk 1+  1+  plantar response down  down  No clonus.  SENSORY:  Vibration is mildly reduced at the great toe.  Temperature and pin prick intact throughout. Rhomberg testing is positive.   COORDINATION/GAIT:  Normal finger-to- nose-finger.  Intact rapid alternating movements bilaterally.  Ataxic appear gait, unassisted.     Data: NCS/EMG of the left hand 09/02/2020: Left median neuropathy at or distal to the wrist, consistent with a clinical diagnosis of carpal tunnel syndrome.  Overall, these findings are moderate-to-severe in degree electrically.   MRI brain wo contrast 12/04/2020: Age normal brain MRI.  No explanation for symptoms.   MRI lumbar spine wo contrast 12/04/2020: 1. Multilevel lumbar spondylosis as described above, slightly progressed at L4-L5. No high-grade spinal canal or neuroforaminal stenosis at any level.  Total time spent reviewing records, interview, history/exam, documentation, and coordination of care on day of encounter:  40 min   Thank you for allowing me to participate in patient's care.  If I can answer any additional questions, I would be pleased to do so.    Sincerely,    [place] Alexa K. Allena Katz, DO

## 2022-10-31 NOTE — Patient Instructions (Signed)
We will refer you to out-patient physical therapy for balance training  Recommend installing grab bars in the shower  Please consider using a walking aide, such as a rollator, especially for long distances

## 2022-11-01 ENCOUNTER — Encounter: Payer: Self-pay | Admitting: Nurse Practitioner

## 2022-11-01 DIAGNOSIS — R2689 Other abnormalities of gait and mobility: Secondary | ICD-10-CM

## 2022-11-03 ENCOUNTER — Other Ambulatory Visit: Payer: Self-pay

## 2022-11-03 ENCOUNTER — Ambulatory Visit (HOSPITAL_COMMUNITY): Payer: Medicare HMO | Attending: Neurology

## 2022-11-03 DIAGNOSIS — R2681 Unsteadiness on feet: Secondary | ICD-10-CM | POA: Diagnosis not present

## 2022-11-03 DIAGNOSIS — R262 Difficulty in walking, not elsewhere classified: Secondary | ICD-10-CM | POA: Insufficient documentation

## 2022-11-03 DIAGNOSIS — R2689 Other abnormalities of gait and mobility: Secondary | ICD-10-CM | POA: Insufficient documentation

## 2022-11-03 NOTE — Therapy (Signed)
OUTPATIENT PHYSICAL THERAPY NEURO EVALUATION   Patient Name: Nancy Williams MRN: [id number] DOB:1940-03-28, 82 y.o., female Today's Date: 11/03/2022   PCP: Abbey Chatters, NP REFERRING PROVIDER: Glendale Chard, DO  END OF SESSION:  PT End of Session - 11/03/22 1303     Visit Number 1    Number of Visits 8    Date for PT Re-Evaluation 12/01/22    Authorization Type [employer]    Progress Note Due on Visit 10    PT Start Time 1302    PT Stop Time 1345    PT Time Calculation (min) 43 min    Activity Tolerance Patient tolerated treatment well    Behavior During Therapy WFL for tasks assessed/performed             Past Medical History:  Diagnosis Date   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    Hypothyroidism    Hashimoto's per [facility] Main New Patient Packet    Neuropathy    Osteoarthritis    Per patient at New patient appointment    Seasonal allergies    Vitamin D deficiency    Past Surgical History:  Procedure Laterality Date   No previous surgery     Patient Active Problem List   Diagnosis Date Noted   Hyperlipemia 09/05/2019   Hypothyroidism    Muscle weakness (generalized) 02/21/2012   Pain in joint, shoulder region 02/16/2012    ONSET DATE: 3 years  REFERRING DIAG: R26.81 (ICD-10-CM) - Unsteady gait  THERAPY DIAG:  Balance problem - Plan: PT plan of care cert/re-cert  Difficulty in walking, not elsewhere classified - Plan: PT plan of care cert/re-cert  Rationale for Evaluation and Treatment: Rehabilitation  SUBJECTIVE:                                                                                                                                                                                             SUBJECTIVE STATEMENT: Here at this clinic before in 2021 for balance.  Also for shoulder in 2014." I feel like I weave when I walk; seem to veer off to the left".   States she has been to chiropractor, cardiologist, and  neurologist.  Has an appointment with ENT next month. Never is dizzy.  MD wants her to try therapy again.  Only when walking.  Seems to be worse with fatigue.  Has to be very careful with uneven ground.  Does not use AD. Unable to line dance any more; lives alone but daughter and son live close by.  Walks a mile everyday; no longer takes gabapentin; only takes thyroid medicine now. Has  been tested for BPPV in the past not able to reproduce symptoms that way.   Pt accompanied by: self  PERTINENT HISTORY: has some mild  neuropathy in feet Macular degeneration- geographic atrophy (a dry form of macular degeneration)  PAIN:  Are you having pain? No  PRECAUTIONS: Fall    WEIGHT BEARING RESTRICTIONS: No  FALLS: Has patient fallen in last 6 months? Yes. Number of falls 1  In March  LIVING ENVIRONMENT: Lives with: lives alone Lives in: House/apartment Stairs: Yes: Internal: 0 steps; none and External: 5 steps; on left going up Has following equipment at home: None  PLOF: Independent  PATIENT GOALS: to not lose my balance  OBJECTIVE:  Note: Objective measures were completed at Evaluation unless otherwise noted.  DIAGNOSTIC FINDINGS:     IMPRESSION: 1. No acute intracranial abnormality. 2. Moderate-sized anterior frontal scalp/forehead hematoma. No underlying calvarial fracture. 3. No acute maxillofacial bone fracture. 4. No acute cervical spine fracture or subluxation.  COGNITION: Overall cognitive status: Within functional limits for tasks assessed   SENSATION: Some times numbness in hands from CPT; some mild neuropathy in her feet  COORDINATION: Lower extremity coordination mildly impaired heel to shin  MUSCLE TONE: appears normal    POSTURE: rounded shoulders and forward head   LOWER EXTREMITY MMT:    MMT Right Eval Left Eval  Hip flexion    Hip extension    Hip abduction    Hip adduction    Hip internal rotation    Hip external rotation    Knee flexion     Knee extension    Ankle dorsiflexion    Ankle plantarflexion    Ankle inversion    Ankle eversion    (Blank rows = not tested)   TRANSFERS: Assistive device utilized: None  Sit to stand: Complete Independence Stand to sit: Complete Independence Chair to chair: Complete Independence Floor:  not tested  STAIRS: Level of Assistance: SBA and CGA Stair Negotiation Technique: Alternating Pattern  with Single Rail on Right Number of Stairs: 4  Height of Stairs: 7"  Comments: no issues noted with steps  GAIT: Gait pattern: shuffling, scissoring, narrow BOS, poor foot clearance- Right, and poor foot clearance- Left Distance walked: 301 ft Assistive device utilized: None Level of assistance: CGA Comments: occasional scissoring; path deviation noted   FUNCTIONAL TESTS:  5 times sit to stand: 14.04 no UE assist 2 minute walk test: 301 ft no AD Dynamic Gait Index: 13/24 SLS  PATIENT SURVEYS:  ABC scale 73.75 %  DGI 1. Gait level surface (1) Moderate Impairment: Walks 20', slow speed, abnormal gait pattern, evidence for imbalance. 2. Change in gait speed (2) Mild Impairment: Is able to change speed but demonstrates mild gait deviations, or not gait deviations but unable to achieve a significant change in velocity, or uses an assistive device. 3. Gait with horizontal head turns (0) Severe Impairment: Performs task with severe disruption of gait, i.e., staggers outside 15" path, loses balance, stops, reaches for wall. 4. Gait with vertical head turns (2) Mild Impairment: Performs head turns smoothly with slight change in gait velocity, i.e., minor disruption to smooth gait path or uses walking aid. 5. Gait and pivot turn (2) Mild Impairment: Pivot turns safely in > 3 seconds and stops with no loss of balance. 6. Step over obstacle (2) Mild Impairment: Is able to step over box, but must slow down and adjust steps to clear box safely. 7. Step around obstacles (2) Mild Impairment:  Is able to  step around both cones, but must slow down and adjust steps to clear cones. 8. Stairs (2) Mild Impairment: Alternating feet, must use rail.  TOTAL SCORE: 13 / 24  OCULOMOTOR EXAM:  Ocular Alignment: normal  Ocular ROM: No Limitations  Spontaneous Nystagmus: absent  Gaze-Induced Nystagmus: absent  Smooth Pursuits: intact  Saccades: intact  Convergence/Divergence: not tested cm   VESTIBULAR - OCULAR REFLEX:   Slow VOR: Normal  VOR Cancellation: Normal  Head-Impulse Test: HIT Right: negative HIT Left: negative  Dynamic Visual Acuity: Not assessed   TODAY'S TREATMENT:                                                                                                                              DATE: 11/03/2022 physical therapy evaluation and HEP instruction    PATIENT EDUCATION: Education details: Patient educated on exam findings, POC, scope of PT, HEP, and what to expect next visit. Encouraged a rollator Person educated: Patient Education method: Explanation, Demonstration, and Handouts Education comprehension: verbalized understanding, returned demonstration, verbal cues required, and tactile cues required  HOME EXERCISE PROGRAM: Access Code: [email or web address] URL: [email or web address] Date: 11/03/2022 Prepared by: AP - Rehab  Exercises - Single Leg Stance with Support  - 2 x daily - 7 x weekly - 1 sets - 3 reps - 30 sec hold - Sit to Stand  - 2 x daily - 7 x weekly - 1 sets - 10 reps - Tandem Stance in Corner  - 2 x daily - 7 x weekly - 1 sets - 3 reps - 30" hold  GOALS: Goals reviewed with patient? No  SHORT TERM GOALS: Target date: 11/17/2022  patient will be independent with initial HEP  Baseline: Goal status: INITIAL  2.  Patient will self report 30% improvement to improve tolerance for functional activity  Baseline:  Goal status: INITIAL   LONG TERM GOALS: Target date: 12/01/2022  Patient will be independent in self management strategies  to improve quality of life and functional outcomes.  Baseline:  Goal status: INITIAL  2.  Patient will self report 50% improvement to improve tolerance for functional activity  Baseline:  Goal status: INITIAL  3.  Patient will improve ABC confidence score to 78% or better to demonstrate improved funtional balance confidence Baseline: 73.75% Goal status: INITIAL  4.  Patient will improve DGI score to 17/24 to demonstrate improved functional gait and balance Baseline: 13/24 Goal status: INITIAL  5.  Patient will improve 5 times sit to stand score from 14.04 sec to 12 sec to demonstrate improved functional mobility and increased lower extremity strength.   Baseline: 14.04 Goal status: INITIAL  ASSESSMENT:  CLINICAL IMPRESSION: Patient is a 82 y.o. female who was seen today for physical therapy evaluation and treatment for R26.81 (ICD-10-CM) - Unsteady gait. Patient demonstrates decreased strength, balance deficits and gait abnormalities which are negatively impacting patient ability to perform ADLs and functional mobility tasks. Patient will benefit from skilled physical therapy  services to address these deficits to improve level of function with ADLs, functional mobility tasks, and reduce risk for falls.    OBJECTIVE IMPAIRMENTS: Abnormal gait, decreased balance, decreased coordination, decreased knowledge of condition, decreased knowledge of use of DME, difficulty walking, and impaired perceived functional ability.   ACTIVITY LIMITATIONS: carrying, lifting, standing, squatting, stairs, and locomotion level  PARTICIPATION LIMITATIONS: meal prep, cleaning, laundry, shopping, community activity, and yard work  [facility] POTENTIAL: Good  CLINICAL DECISION MAKING: Evolving/moderate complexity  EVALUATION COMPLEXITY: Moderate  PLAN:  PT FREQUENCY: 2x/week  PT DURATION: 4 weeks  PLANNED INTERVENTIONS: Therapeutic exercises, Therapeutic activity, Neuromuscular re-education, Balance  training, Gait training, Patient/Family education, Joint manipulation, Joint mobilization, Stair training, Orthotic/Fit training, DME instructions, Aquatic Therapy, Dry Needling, Electrical stimulation, Spinal manipulation, Spinal mobilization, Cryotherapy, Moist heat, Compression bandaging, scar mobilization, Splintting, Taping, Traction, Ultrasound, Ionotophoresis 4mg /ml Dexamethasone, and Manual therapy   PLAN FOR NEXT SESSION: Review HEP and goals; follow up on encouraging patient to purchase a rollator for safety; check lower extremity strength and SLS; gait and balance training  2:15 PM, 11/03/22 Dimitris Shanahan Small Jaedan Huttner MPT  physical therapy [place] [phone] Ph:[phone]

## 2022-11-07 ENCOUNTER — Ambulatory Visit (HOSPITAL_COMMUNITY): Payer: Medicare HMO | Admitting: Physical Therapy

## 2022-11-07 ENCOUNTER — Encounter (HOSPITAL_COMMUNITY): Payer: Self-pay | Admitting: Physical Therapy

## 2022-11-07 DIAGNOSIS — R2681 Unsteadiness on feet: Secondary | ICD-10-CM | POA: Diagnosis not present

## 2022-11-07 DIAGNOSIS — R262 Difficulty in walking, not elsewhere classified: Secondary | ICD-10-CM

## 2022-11-07 DIAGNOSIS — R2689 Other abnormalities of gait and mobility: Secondary | ICD-10-CM

## 2022-11-07 NOTE — Therapy (Signed)
OUTPATIENT PHYSICAL THERAPY NEURO EVALUATION   Patient Name: Nancy Williams MRN: [id number] DOB:08/01/1940, 82 y.o., female Today's Date: 11/07/2022   PCP: Abbey Chatters, NP REFERRING PROVIDER: Glendale Chard, DO  END OF SESSION:  PT End of Session - 11/07/22 0930     Visit Number 2    Number of Visits 8    Date for PT Re-Evaluation 12/01/22    Authorization Type [employer] Medicare    Progress Note Due on Visit 10    PT Start Time 0930    PT Stop Time 1015    PT Time Calculation (min) 45 min    Activity Tolerance Patient tolerated treatment well    Behavior During Therapy WFL for tasks assessed/performed             Past Medical History:  Diagnosis Date   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    Hypothyroidism    Hashimoto's per [facility] New Patient Packet    Neuropathy    Osteoarthritis    Per patient at New patient appointment    Seasonal allergies    Vitamin D deficiency    Past Surgical History:  Procedure Laterality Date   No previous surgery     Patient Active Problem List   Diagnosis Date Noted   Hyperlipemia 09/05/2019   Hypothyroidism    Muscle weakness (generalized) 02/21/2012   Pain in joint, shoulder region 02/16/2012    ONSET DATE: 3 years  REFERRING DIAG: R26.81 (ICD-10-CM) - Unsteady gait  THERAPY DIAG:  Balance problem  Difficulty in walking, not elsewhere classified  Rationale for Evaluation and Treatment: Rehabilitation  SUBJECTIVE:                                                                                                                                                                                             SUBJECTIVE STATEMENT: Pt states she has been able to do her exercises daily.   From eval: Here at this clinic before in 2021 for balance.  Also for shoulder in 2014." I feel like I weave when I walk; seem to veer off to the left".   States she has been to chiropractor, cardiologist, and  neurologist.  Has an appointment with ENT next month. Never is dizzy.  MD wants her to try therapy again.  Only when walking.  Seems to be worse with fatigue.  Has to be very careful with uneven ground.  Does not use AD. Unable to line dance any more; lives alone but daughter and son live close by.  Walks a mile everyday; no longer takes gabapentin; only takes thyroid medicine now.  Has been tested for BPPV in the past not able to reproduce symptoms that way.   Pt accompanied by: self  PERTINENT HISTORY: has some mild  neuropathy in feet Macular degeneration- geographic atrophy (a dry form of macular degeneration)  PAIN:  Are you having pain? No  PRECAUTIONS: Fall    WEIGHT BEARING RESTRICTIONS: No  FALLS: Has patient fallen in last 6 months? Yes. Number of falls 1  In March  LIVING ENVIRONMENT: Lives with: lives alone Lives in: House/apartment Stairs: Yes: Internal: 0 steps; none and External: 5 steps; on left going up Has following equipment at home: None  PLOF: Independent  PATIENT GOALS: to not lose my balance  OBJECTIVE:  Note: Objective measures were completed at Evaluation unless otherwise noted.  DIAGNOSTIC FINDINGS:     IMPRESSION: 1. No acute intracranial abnormality. 2. Moderate-sized anterior frontal scalp/forehead hematoma. No underlying calvarial fracture. 3. No acute maxillofacial bone fracture. 4. No acute cervical spine fracture or subluxation.  COGNITION: Overall cognitive status: Within functional limits for tasks assessed   SENSATION: Some times numbness in hands from CPT; some mild neuropathy in her feet  COORDINATION: Lower extremity coordination mildly impaired heel to shin  MUSCLE TONE: appears normal    POSTURE: rounded shoulders and forward head   LOWER EXTREMITY MMT:    MMT Right 11/07/22 Left 11/07/22  Hip flexion 3+ 3+  Hip extension 3+ 3+  Hip abduction 3+ 3+  Hip adduction    Hip internal rotation    Hip external rotation     Knee flexion 5 5  Knee extension 5 5  Ankle dorsiflexion 4 4  Ankle plantarflexion 5 5  Ankle inversion    Ankle eversion    (Blank rows = not tested)   TRANSFERS: Assistive device utilized: None  Sit to stand: Complete Independence Stand to sit: Complete Independence Chair to chair: Complete Independence Floor:  not tested  STAIRS: Level of Assistance: SBA and CGA Stair Negotiation Technique: Alternating Pattern  with Single Rail on Right Number of Stairs: 4  Height of Stairs: 7"  Comments: no issues noted with steps  GAIT: Gait pattern: shuffling, scissoring, narrow BOS, poor foot clearance- Right, and poor foot clearance- Left Distance walked: 301 ft Assistive device utilized: None Level of assistance: CGA Comments: occasional scissoring; path deviation noted   FUNCTIONAL TESTS:  5 times sit to stand: 14.04 no UE assist 2 minute walk test: 301 ft no AD Dynamic Gait Index: 13/24 SLS (11/07/22): L 28.79 sec, R 22.08 sec  PATIENT SURVEYS:  ABC scale 73.75 %  DGI 1. Gait level surface (1) Moderate Impairment: Walks 20', slow speed, abnormal gait pattern, evidence for imbalance. 2. Change in gait speed (2) Mild Impairment: Is able to change speed but demonstrates mild gait deviations, or not gait deviations but unable to achieve a significant change in velocity, or uses an assistive device. 3. Gait with horizontal head turns (0) Severe Impairment: Performs task with severe disruption of gait, i.e., staggers outside 15" path, loses balance, stops, reaches for wall. 4. Gait with vertical head turns (2) Mild Impairment: Performs head turns smoothly with slight change in gait velocity, i.e., minor disruption to smooth gait path or uses walking aid. 5. Gait and pivot turn (2) Mild Impairment: Pivot turns safely in > 3 seconds and stops with no loss of balance. 6. Step over obstacle (2) Mild Impairment: Is able to step over box, but must slow down and adjust steps to clear  box safely. 7. Step  around obstacles (2) Mild Impairment: Is able to step around both cones, but must slow down and adjust steps to clear cones. 8. Stairs (2) Mild Impairment: Alternating feet, must use rail.  TOTAL SCORE: 13 / 24  OCULOMOTOR EXAM:  Ocular Alignment: normal  Ocular ROM: No Limitations  Spontaneous Nystagmus: absent  Gaze-Induced Nystagmus: absent  Smooth Pursuits: intact  Saccades: intact  Convergence/Divergence: not tested cm   VESTIBULAR - OCULAR REFLEX:   Slow VOR: Normal  VOR Cancellation: Normal  Head-Impulse Test: HIT Right: negative HIT Left: negative  Dynamic Visual Acuity: Not assessed   TODAY'S TREATMENT:                                                                                                                              DATE:  11/07/22 Nustep L4 x 5 min UEs/LEs Checked MMT and SLS SLS x30" SLS with shoulder ext red TB 2x10 Tandem stance corner 2x30" Tandem walking 2x10' Sit to stand 2x10 Side step red TB 3x10 R&L  11/03/2022 physical therapy evaluation and HEP instruction    PATIENT EDUCATION: Education details: Patient educated on exam findings, POC, scope of PT, HEP, and what to expect next visit. Encouraged a rollator Person educated: Patient Education method: Explanation, Demonstration, and Handouts Education comprehension: verbalized understanding, returned demonstration, verbal cues required, and tactile cues required  HOME EXERCISE PROGRAM: Access Code: [email or web address] URL: [email or web address] Date: 11/07/2022 Prepared by: Vernon Prey April Kirstie Peri  Exercises - Sit to Stand  - 2 x daily - 7 x weekly - 1 sets - 10 reps - Resistance Pulldown with March  - 1 x daily - 7 x weekly - 2 sets - 30 sec hold - Tandem Walking with Counter Support  - 1 x daily - 7 x weekly - 2 sets - 10 reps - Side Stepping with Resistance at Ankles and Counter Support  - 1 x daily - 7 x weekly - 2 sets - 10 reps  GOALS: Goals reviewed  with patient? No  SHORT TERM GOALS: Target date: 11/17/2022  patient will be independent with initial HEP  Baseline: Goal status: INITIAL  2.  Patient will self report 30% improvement to improve tolerance for functional activity  Baseline:  Goal status: INITIAL   LONG TERM GOALS: Target date: 12/01/2022  Patient will be independent in self management strategies to improve quality of life and functional outcomes.  Baseline:  Goal status: INITIAL  2.  Patient will self report 50% improvement to improve tolerance for functional activity  Baseline:  Goal status: INITIAL  3.  Patient will improve ABC confidence score to 78% or better to demonstrate improved funtional balance confidence Baseline: 73.75% Goal status: INITIAL  4.  Patient will improve DGI score to 17/24 to demonstrate improved functional gait and balance Baseline: 13/24 Goal status: INITIAL  5.  Patient will improve 5 times sit to stand score from 14.04 sec to 12 sec to demonstrate improved functional mobility and  increased lower extremity strength.   Baseline: 14.04 Goal status: INITIAL  ASSESSMENT:  CLINICAL IMPRESSION: Reviewed and progressed HEP as tolerated. Found pt to have very weak hips but otherwise good with MMT. Added hip strengthening exercises today. Reviewed sit to stand to focus on placing weight back in the heels for increased glute engagement.   From eval: Patient is a 82 y.o. female who was seen today for physical therapy evaluation and treatment for R26.81 (ICD-10-CM) - Unsteady gait. Patient demonstrates decreased strength, balance deficits and gait abnormalities which are negatively impacting patient ability to perform ADLs and functional mobility tasks. Patient will benefit from skilled physical therapy services to address these deficits to improve level of function with ADLs, functional mobility tasks, and reduce risk for falls.    OBJECTIVE IMPAIRMENTS: Abnormal gait, decreased balance,  decreased coordination, decreased knowledge of condition, decreased knowledge of use of DME, difficulty walking, and impaired perceived functional ability.   ACTIVITY LIMITATIONS: carrying, lifting, standing, squatting, stairs, and locomotion level  PARTICIPATION LIMITATIONS: meal prep, cleaning, laundry, shopping, community activity, and yard work  [facility] POTENTIAL: Good  CLINICAL DECISION MAKING: Evolving/moderate complexity  EVALUATION COMPLEXITY: Moderate  PLAN:  PT FREQUENCY: 2x/week  PT DURATION: 4 weeks  PLANNED INTERVENTIONS: Therapeutic exercises, Therapeutic activity, Neuromuscular re-education, Balance training, Gait training, Patient/Family education, Joint manipulation, Joint mobilization, Stair training, Orthotic/Fit training, DME instructions, Aquatic Therapy, Dry Needling, Electrical stimulation, Spinal manipulation, Spinal mobilization, Cryotherapy, Moist heat, Compression bandaging, scar mobilization, Splintting, Taping, Traction, Ultrasound, Ionotophoresis 4mg /ml Dexamethasone, and Manual therapy   PLAN FOR NEXT SESSION: Review HEP and goals; follow up on encouraging patient to purchase a rollator for safety; gait and balance training  9:30 AM, 11/07/22 Mandi Mattioli April Ma Alphonsa Overall, PT [facility] [phone]

## 2022-11-14 ENCOUNTER — Encounter: Payer: Self-pay | Admitting: Nurse Practitioner

## 2022-11-14 ENCOUNTER — Ambulatory Visit (INDEPENDENT_AMBULATORY_CARE_PROVIDER_SITE_OTHER): Payer: Medicare HMO | Admitting: Nurse Practitioner

## 2022-11-14 DIAGNOSIS — Z0000 Encounter for general adult medical examination without abnormal findings: Secondary | ICD-10-CM | POA: Diagnosis not present

## 2022-11-14 NOTE — Progress Notes (Signed)
Subjective:   Nancy Williams is a 82 y.o. female who presents for Medicare Annual (Subsequent) preventive examination.  Visit Complete: Virtual I connected with  Nancy Williams on 11/14/22 by a video and audio enabled telemedicine application and verified that I am speaking with the correct person using two identifiers.  Patient Location: Home  Provider Location: Office/Clinic  I discussed the limitations of evaluation and management by telemedicine. The patient expressed understanding and agreed to proceed.  Vital Signs: Because this visit was a virtual/telehealth visit, some criteria may be missing or patient reported. Any vitals not documented were not able to be obtained and vitals that have been documented are patient reported.   Cardiac Risk Factors include: advanced age (>50men, >70 women);family history of premature cardiovascular disease     Objective:    There were no vitals filed for this visit. There is no height or weight on file to calculate BMI.     11/14/2022    1:09 PM 11/03/2022    1:07 PM 10/31/2022    2:07 PM 10/24/2022    2:35 PM 10/24/2022    1:19 PM 10/10/2022    1:25 PM 02/12/2022   11:25 AM  Advanced Directives  Does Patient Have a Medical Advance Directive? Yes Yes Yes Yes Yes Yes Yes  Type of Advance Directive Living will Healthcare Power of [place];Living will;Out of facility DNR (pink MOST or yellow form) Healthcare Power of [place];Living will;Out of facility DNR (pink MOST or yellow form) Living will Living will Living will Living will  Does patient want to make changes to medical advance directive? No - Patient declined No - Patient declined  No - Patient declined No - Patient declined No - Patient declined     Current Medications (verified) Outpatient Encounter Medications as of 11/14/2022  Medication Sig   b complex vitamins tablet Take 1 tablet by mouth daily.   Calcium Carb-Cholecalciferol (CALCIUM 500+D3 PO) Take 1 capsule by mouth daily. 200  units of vit D   [employer] OMEGA-3 KRILL OIL PO Take 1 capsule by mouth daily.   fluticasone (FLONASE) 50 MCG/ACT nasal spray USE 1 SPRAY(S) IN EACH NOSTRIL AS NEEDED FOR  ALLERGIES  OR  RHINITIS   gabapentin (NEURONTIN) 100 MG capsule Take 100 mg by mouth as needed.   levothyroxine (SYNTHROID) 50 MCG tablet Take 1 tablet (50 mcg total) by mouth daily before breakfast.   Magnesium Glycinate 120 MG CAPS Take 1 capsule by mouth daily.   Multiple Vitamins-Minerals (PRESERVISION AREDS 2) CAPS Take 1 capsule by mouth daily.   UNABLE TO FIND Med Name: methylated  completed once daily   UNABLE TO FIND Take 1 capsule by mouth daily. Med Name: Cell Salt   VITAMIN D-VITAMIN K PO Take 1 capsule by mouth daily.   No facility-administered encounter medications on file as of 11/14/2022.    Allergies (verified) Patient has no known allergies.   History: Past Medical History:  Diagnosis Date   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    Hypothyroidism    Hashimoto's per [facility] New Patient Packet    Neuropathy    Osteoarthritis    Per patient at New patient appointment    Seasonal allergies    Vitamin D deficiency    Past Surgical History:  Procedure Laterality Date   No previous surgery     Family History  Problem Relation Age of Onset   Heart disease Mother    Heart failure Mother    Heart  disease Father    Prostate cancer Father    Heart disease Son    Diabetes Son    Prostate cancer Son    Thyroid disease Son    Hashimoto's thyroiditis Daughter    Social History   Socioeconomic History   Marital status: Widowed    Spouse name: Not on file   Number of children: Not on file   Years of education: Not on file   Highest education level: 12th grade  Occupational History   Not on file  Tobacco Use   Smoking status: Never   Smokeless tobacco: Never  Vaping Use   Vaping status: Never Used  Substance and Sexual Activity   Alcohol use: Yes    Comment: Occasional,  last drink 1-2 years ago as of 2021    Drug use: Never   Sexual activity: Not on file  Other Topics Concern   Not on file  Social History Narrative   Per [facility] New Patient Packet Abstracted on 08/19/2019:      Diet: Vegetarian, gluten free       Caffeine: Coffee & Tea      Married, if yes what year: Widow, 1959      Do you live in a house, apartment, assisted living, condo, trailer, ect: House      Is it one or more stories: One stories, one person       Pets: 4 cats       Current/Past profession: Left Blank      Highest level or education completed: 12 th grade       Exercise:        Try          Type and how often: Need to clarify, unable to read what patient wrote          Living Will: Yes   DNR: Yes   POA/HPOA: Yes      Functional Status:   Do you have difficulty bathing or dressing yourself? No   Do you have difficulty preparing food or eating?No   Do you have difficulty managing your medications? No   Do you have difficulty managing your finances? No   Do you have difficulty affording your medications? No         Right Handed    Lives in a one story home    Social Determinants of Health   Financial Resource Strain: Low Risk  (10/06/2022)   Overall Financial Resource Strain (CARDIA)    Difficulty of Paying Living Expenses: Not hard at all  Food Insecurity: No Food Insecurity (10/06/2022)   Hunger Vital Sign    Worried About Running Out of Food in the Last Year: Never true    Ran Out of Food in the Last Year: Never true  Transportation Needs: No Transportation Needs (10/06/2022)   PRAPARE - Administrator, Civil Service (Medical): No    Lack of Transportation (Non-Medical): No  Physical Activity: Sufficiently Active (10/06/2022)   Exercise Vital Sign    Days of Exercise per Week: 6 days    Minutes of Exercise per Session: 30 min  Stress: No Stress Concern Present (10/06/2022)   [employer] of Occupational Health - Occupational Stress Questionnaire     Feeling of Stress : Only a little  Social Connections: Moderately Isolated (10/06/2022)   Social Connection and Isolation Panel [NHANES]    Frequency of Communication with Friends and Family: More than three times a week    Frequency of  Social Gatherings with Friends and Family: Once a week    Attends Religious Services: Never    Database administrator or Organizations: Yes    Attends Engineer, structural: More than 4 times per year    Marital Status: Widowed    Tobacco Counseling Counseling given: Not Answered   Clinical Intake:     Pain : No/denies pain     BMI - recorded: 21 Nutritional Status: BMI of 19-24  Normal Nutritional Risks: None Diabetes: No  How often do you need to have someone help you when you read instructions, pamphlets, or other written materials from your doctor or pharmacy?: 1 - Never         Activities of Daily Living    11/14/2022    1:13 PM 11/12/2022    5:45 PM  In your present state of health, do you have any difficulty performing the following activities:  Hearing? 0 0  Vision? 1 1  Difficulty concentrating or making decisions? 1 0  Walking or climbing stairs? 1 0  Dressing or bathing? 0 0  Doing errands, shopping? 0 0  Preparing Food and eating ? N N  Using the Toilet? N N  In the past six months, have you accidently leaked urine? Y N  Comment rare   Do you have problems with loss of bowel control? N N  Managing your Medications? N N  Managing your Finances? N N  Housekeeping or managing your Housekeeping? N N    Patient Care Team: Sharon Seller, NP as PCP - General (Geriatric Medicine) Jonelle Sidle, MD as PCP - Cardiology (Cardiology) Marcelino Duster, MD as Referring Physician (Dermatology) Daisy Lazar, DO (Optometry) Newman Pies, MD as Consulting Physician (Otolaryngology) Glendale Chard, DO as Consulting Physician (Neurology)  Indicate any recent Medical Services you may have received from other  than [facility] providers in the past year (date may be approximate).     Assessment:   This is a routine wellness examination for Kandee.  Hearing/Vision screen Hearing Screening - Comments:: No hearing issues  Vision Screening - Comments:: Last eye exam less than 12 months ago due to eye injections    Goals Addressed             This Visit's Progress    Patient Stated       To figure out what is wrong with gait       Depression Screen    11/14/2022    1:07 PM 10/24/2022    2:34 PM 10/10/2022    2:02 PM 02/02/2022    1:15 PM 09/21/2021    3:23 PM 11/16/2020    2:51 PM 09/18/2020    8:25 AM  PHQ 2/9 Scores  PHQ - 2 Score 0 0 0 0 0 0 0    Fall Risk    11/14/2022    1:07 PM 11/12/2022    5:45 PM 10/31/2022    2:07 PM 10/24/2022    2:33 PM 10/23/2022    4:29 PM  Fall Risk   Falls in the past year? 1 1 1 1 1   Number falls in past yr: 0 0 0 0 0  Injury with Fall? 0 1 1 0 1  Risk for fall due to : No Fall Risks   No Fall Risks   Follow up Falls evaluation completed  Falls evaluation completed Falls evaluation completed     MEDICARE RISK AT HOME: Medicare Risk at Home Any stairs in or around  the home?: Yes If so, are there any without handrails?: No Home free of loose throw rugs in walkways, pet beds, electrical cords, etc?: Yes Adequate lighting in your home to reduce risk of falls?: Yes Life alert?: Yes Use of a cane, walker or w/c?: No Grab bars in the bathroom?: No Shower chair or bench in shower?: Yes Elevated toilet seat or a handicapped toilet?: Yes  TIMED UP AND GO:  Was the test performed?  No    Cognitive Function:        10/24/2022    2:35 PM 09/21/2021    3:25 PM 09/18/2020    8:26 AM 09/18/2019    8:42 AM  6CIT Screen  What Year? 0 points 0 points 4 points 0 points  What month? 0 points 0 points 0 points 0 points  What time? 0 points 0 points 0 points 0 points  Count back from 20 0 points 0 points 0 points 0 points  Months in reverse 0 points 0  points 0 points 0 points  Repeat phrase 0 points 2 points 0 points 0 points  Total Score 0 points 2 points 4 points 0 points    Immunizations Immunization History  Administered Date(s) Administered   Fluad Quad(high Dose 65+) 11/20/2019, 11/16/2020, 02/02/2022   Influenza-Unspecified 02/16/2018   Moderna SARS-COV2 Booster Vaccination 12/18/2019   Moderna Sars-Covid-2 Vaccination 02/12/2019, 03/15/2019   Pneumococcal Conjugate-13 04/25/2014   Pneumococcal Polysaccharide-23 12/14/2016   Tdap 08/21/2012, 02/12/2022    TDAP status: Up to date  Flu Vaccine status: Due, Education has been provided regarding the importance of this vaccine. Advised may receive this vaccine at local pharmacy or Health Dept. Aware to provide a copy of the vaccination record if obtained from local pharmacy or Health Dept. Verbalized acceptance and understanding.  Pneumococcal vaccine status: Up to date  Covid-19 vaccine status: Information provided on how to obtain vaccines.   Qualifies for Shingles Vaccine? Yes   Zostavax completed No   Shingrix Completed?: No.    Education has been provided regarding the importance of this vaccine. Patient has been advised to call insurance company to determine out of pocket expense if they have not yet received this vaccine. Advised may also receive vaccine at local pharmacy or Health Dept. Verbalized acceptance and understanding.  Screening Tests Health Maintenance  Topic Date Due   INFLUENZA VACCINE  09/01/2022   COVID-19 Vaccine (4 - 2023-24 season) 10/02/2022   Medicare Annual Wellness (AWV)  11/14/2023   DTaP/Tdap/Td (3 - Td or Tdap) 02/13/2032   Pneumonia Vaccine 41+ Years old  Completed   DEXA SCAN  Completed   HPV VACCINES  Aged Out   Zoster Vaccines- Shingrix  Discontinued    Health Maintenance  Health Maintenance Due  Topic Date Due   INFLUENZA VACCINE  09/01/2022   COVID-19 Vaccine (4 - 2023-24 season) 10/02/2022    Colorectal cancer screening: No  longer required.   Mammogram status: No longer required due to age.  Bone Density status: Completed 10/12/2021. Results reflect: Bone density results: OSTEOPENIA. Repeat every 2 years.  Lung Cancer Screening: (Low Dose CT Chest recommended if Age 71-80 years, 20 pack-year currently smoking OR have quit w/in 15years.) does not qualify.   Lung Cancer Screening Referral: na  Additional Screening:  Hepatitis C Screening: does not qualify  Vision Screening: Recommended annual ophthalmology exams for early detection of glaucoma and other disorders of the eye. Is the patient up to date with their annual eye exam?  Yes  Who is the provider or what is the name of the office in which the patient attends annual eye exams? Dr Allyne Gee   If pt is not established with a provider, would they like to be referred to a provider to establish care? No .   Dental Screening: Recommended annual dental exams for proper oral hygiene Community Resource Referral / Chronic Care Management: CRR required this visit?  No   CCM required this visit?  No     Plan:     I have personally reviewed and noted the following in the patient's chart:   Medical and social history Use of alcohol, tobacco or illicit drugs  Current medications and supplements including opioid prescriptions. Patient is not currently taking opioid prescriptions. Functional ability and status Nutritional status Physical activity Advanced directives List of other physicians Hospitalizations, surgeries, and ER visits in previous 12 months Vitals Screenings to include cognitive, depression, and falls Referrals and appointments  In addition, I have reviewed and discussed with patient certain preventive protocols, quality metrics, and best practice recommendations. A written personalized care plan for preventive services as well as general preventive health recommendations were provided to patient.     Sharon Seller, NP   11/14/2022    After Visit Summary: (MyChart) Due to this being a telephonic visit, the after visit summary with patients personalized plan was offered to patient via MyChart

## 2022-11-14 NOTE — Progress Notes (Signed)
   This service is provided via telemedicine  No vital signs collected/recorded due to the encounter was a telemedicine visit.   Location of patient (ex: home, work):  Home  Patient consents to a telephone visit: Yes  Location of the provider (ex: office, home):  [facility], Office   Name of any referring provider:  N/A  Names of all persons participating in the telemedicine service and their role in the encounter:  S.Chrae B/CMA, Abbey Chatters, NP, and Patient   Time spent on call:  9 min with medical assistant

## 2022-11-15 ENCOUNTER — Encounter (HOSPITAL_COMMUNITY): Payer: Medicare HMO

## 2022-11-17 ENCOUNTER — Ambulatory Visit (HOSPITAL_COMMUNITY): Payer: Medicare HMO

## 2022-11-17 DIAGNOSIS — R2681 Unsteadiness on feet: Secondary | ICD-10-CM | POA: Diagnosis not present

## 2022-11-17 DIAGNOSIS — R2689 Other abnormalities of gait and mobility: Secondary | ICD-10-CM | POA: Diagnosis not present

## 2022-11-17 DIAGNOSIS — R262 Difficulty in walking, not elsewhere classified: Secondary | ICD-10-CM | POA: Diagnosis not present

## 2022-11-17 NOTE — Therapy (Signed)
OUTPATIENT PHYSICAL THERAPY TREATMENT   Patient Name: Nancy Williams MRN: [id number] DOB:December 11, 1940, 82 y.o., female Today's Date: 11/17/2022   PCP: Abbey Chatters, NP REFERRING PROVIDER: Glendale Chard, DO  END OF SESSION:  PT End of Session - 11/17/22 1106     Visit Number 3    Number of Visits 8    Date for PT Re-Evaluation 12/01/22    Authorization Type [employer]    Progress Note Due on Visit 10    PT Start Time 1101    PT Stop Time 1141    PT Time Calculation (min) 40 min    Activity Tolerance Patient tolerated treatment well    Behavior During Therapy WFL for tasks assessed/performed             Past Medical History:  Diagnosis Date   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    Hypothyroidism    Hashimoto's per [facility] New Patient Packet    Neuropathy    Osteoarthritis    Per patient at New patient appointment    Seasonal allergies    Vitamin D deficiency    Past Surgical History:  Procedure Laterality Date   No previous surgery     Patient Active Problem List   Diagnosis Date Noted   Hyperlipemia 09/05/2019   Hypothyroidism    Muscle weakness (generalized) 02/21/2012   Pain in joint, shoulder region 02/16/2012    ONSET DATE: 3 years  REFERRING DIAG: R26.81 (ICD-10-CM) - Unsteady gait  THERAPY DIAG:  Balance problem  Difficulty in walking, not elsewhere classified  Rationale for Evaluation and Treatment: Rehabilitation  SUBJECTIVE:                                                                                                                                                                                             SUBJECTIVE STATEMENT: No updates, pt said her chronic low back issue was flared Monday and Tuesday which is why she was unable to come in. Her back feels better today.   From eval: Pt accompanied by: self  HISTORY OF PRESENT ILLNESS: Here at this clinic before in 2021 for balance.  Also for shoulder in  2014." I feel like I weave when I walk; seem to veer off to the left".   States she has been to chiropractor, cardiologist, and neurologist.  Has an appointment with ENT next month. Never is dizzy.  MD wants her to try therapy again.  Only when walking.  Seems to be worse with fatigue.  Has to be very careful with uneven ground.  Does not use AD. Unable to line dance  any more; lives alone but daughter and son live close by.  Walks a mile everyday; no longer takes gabapentin; only takes thyroid medicine now. Has been tested for BPPV in the past not able to reproduce symptoms that way.  Has some mild  neuropathy in feet  Macular degeneration- geographic atrophy (a dry form of macular degeneration)  PAIN:  Are you having pain? No  PRECAUTIONS: Fall   WEIGHT BEARING RESTRICTIONS: No  FALLS: Has patient fallen in last 6 months? Yes. Number of falls 1  In March  PATIENT GOALS: to not lose my balance  OBJECTIVE:  Note: Objective measures were completed at Evaluation unless otherwise noted.  DIAGNOSTIC FINDINGS:   IMPRESSION: 1. No acute intracranial abnormality. 2. Moderate-sized anterior frontal scalp/forehead hematoma. No underlying calvarial fracture. 3. No acute maxillofacial bone fracture. 4. No acute cervical spine fracture or subluxation.  COGNITION: Overall cognitive status: Within functional limits for tasks assessed   SENSATION: Some times numbness in hands from CPT; some mild neuropathy in her feet  LOWER EXTREMITY MMT:    MMT Right 11/07/22 Left 11/07/22  Hip flexion 3+ 3+  Hip extension 3+ 3+  Hip abduction 3+ 3+  Hip adduction    Hip internal rotation    Hip external rotation    Knee flexion 5 5  Knee extension 5 5  Ankle dorsiflexion 4 4  Ankle plantarflexion 5 5  Ankle inversion    Ankle eversion    (Blank rows = not tested)   TRANSFERS: Assistive device utilized: None  Sit to stand: Complete Independence Stand to sit: Complete Independence Chair to  chair: Complete Independence Floor:  not tested  STAIRS: Level of Assistance: SBA and CGA Stair Negotiation Technique: Alternating Pattern  with Single Rail on Right Number of Stairs: 4  Height of Stairs: 7"  Comments: no issues noted with steps  GAIT: Gait pattern: shuffling, scissoring, narrow BOS, poor foot clearance- Right, and poor foot clearance- Left Distance walked: 301 ft Assistive device utilized: None Level of assistance: CGA Comments: occasional scissoring; path deviation noted   FUNCTIONAL TESTS:  5 times sit to stand: 14.04 no UE assist 2 minute walk test: 301 ft no AD Dynamic Gait Index: 13/24 SLS (11/07/22): L 28.79 sec, R 22.08 sec  PATIENT SURVEYS:  ABC scale 73.75 %  DGI 1. Gait level surface (1) Moderate Impairment: Walks 20', slow speed, abnormal gait pattern, evidence for imbalance. 2. Change in gait speed (2) Mild Impairment: Is able to change speed but demonstrates mild gait deviations, or not gait deviations but unable to achieve a significant change in velocity, or uses an assistive device. 3. Gait with horizontal head turns (0) Severe Impairment: Performs task with severe disruption of gait, i.e., staggers outside 15" path, loses balance, stops, reaches for wall. 4. Gait with vertical head turns (2) Mild Impairment: Performs head turns smoothly with slight change in gait velocity, i.e., minor disruption to smooth gait path or uses walking aid. 5. Gait and pivot turn (2) Mild Impairment: Pivot turns safely in > 3 seconds and stops with no loss of balance. 6. Step over obstacle (2) Mild Impairment: Is able to step over box, but must slow down and adjust steps to clear box safely. 7. Step around obstacles (2) Mild Impairment: Is able to step around both cones, but must slow down and adjust steps to clear cones. 8. Stairs (2) Mild Impairment: Alternating feet, must use rail.  TOTAL SCORE: 13 / 24  OCULOMOTOR EXAM:  Ocular Alignment:  normal  Ocular  ROM: No Limitations  Spontaneous Nystagmus: absent  Gaze-Induced Nystagmus: absent  Smooth Pursuits: intact  Saccades: intact  Convergence/Divergence: not tested cm   VESTIBULAR - OCULAR REFLEX:   Slow VOR: Normal  VOR Cancellation: Normal  Head-Impulse Test: HIT Right: negative HIT Left: negative  Dynamic Visual Acuity: Not assessed   TODAY'S TREATMENT:                                                                                                                              DATE:  11/17/22  -Nustep L4 x 4 min UEs/LEs, seat 7, arms 9 -lateral side stepping with red TB moved up to knees, cued for small steps 1x67ft bilat, 2 hand support -retro stepping and forward c RTB at knees and alternate forward 60ft each  -STS from chair 1x10 hands free -on airex foam, min Guard assist: horizontal head turns, vertical head turns -eyes closed on foam 3x30secH, minguard to minA for LOB> 5  -balance challenge course: 2" firm step up, to floor, to 3" foam step up to floor to cone u-turn, then repeat in reverse: 5x with 2lb AW, red TB above knees.      PATIENT EDUCATION: Education details: Patient educated on exam findings, POC, scope of PT, HEP, and what to expect next visit. Encouraged a rollator Person educated: Patient Education method: Explanation, Demonstration, and Handouts Education comprehension: verbalized understanding, returned demonstration, verbal cues required, and tactile cues required  HOME EXERCISE PROGRAM: Access Code: [email or web address] URL: [email or web address] Date: 11/17/2022 Prepared by: Alvera Novel  Exercises - Sit to Stand  - 2 x daily - 7 x weekly - 1 sets - 10 reps - Resistance Pulldown with March  - 1 x daily - 7 x weekly - 2 sets - 30 sec hold - Tandem Walking with Counter Support  - 1 x daily - 7 x weekly - 2 sets - 10 reps - Side Stepping with Resistance at Thighs and Counter Support  - 1 x daily - 7 x weekly - 2 sets - 10 reps (updated visit 3)    GOALS: Goals reviewed with patient? No  SHORT TERM GOALS: Target date: 11/17/2022  patient will be independent with initial HEP  Baseline: Goal status: INITIAL  2.  Patient will self report 30% improvement to improve tolerance for functional activity  Baseline:  Goal status: INITIAL   LONG TERM GOALS: Target date: 12/01/2022  Patient will be independent in self management strategies to improve quality of life and functional outcomes.  Baseline:  Goal status: INITIAL  2.  Patient will self report 50% improvement to improve tolerance for functional activity  Baseline:  Goal status: INITIAL  3.  Patient will improve ABC confidence score to 78% or better to demonstrate improved funtional balance confidence Baseline: 73.75% Goal status: INITIAL  4.  Patient will improve DGI score to 17/24 to demonstrate improved functional gait and balance Baseline: 13/24 Goal status: INITIAL  5.  Patient will improve 5 times sit to stand score from 14.04 sec to 12 sec to demonstrate improved functional mobility and increased lower extremity strength.   Baseline: 14.04 Goal status: INITIAL  ASSESSMENT:  CLINICAL IMPRESSION: Modified HEP to reduce back pulling. Continued to work on balance interventions. Soft compliant surfaces remain the most triggering with noted abnormal sway increased and delayed reaction time. Will continue to advance as able so pt can meet LT goals.    OBJECTIVE IMPAIRMENTS: Abnormal gait, decreased balance, decreased coordination, decreased knowledge of condition, decreased knowledge of use of DME, difficulty walking, and impaired perceived functional ability.   ACTIVITY LIMITATIONS: carrying, lifting, standing, squatting, stairs, and locomotion level  PARTICIPATION LIMITATIONS: meal prep, cleaning, laundry, shopping, community activity, and yard work  [facility] POTENTIAL: Good  CLINICAL DECISION MAKING: Evolving/moderate complexity  EVALUATION COMPLEXITY:  Moderate  PLAN:  PT FREQUENCY: 2x/week  PT DURATION: 4 weeks  PLANNED INTERVENTIONS: Therapeutic exercises, Therapeutic activity, Neuromuscular re-education, Balance training, Gait training, Patient/Family education, Joint manipulation, Joint mobilization, Stair training, Orthotic/Fit training, DME instructions, Aquatic Therapy, Dry Needling, Electrical stimulation, Spinal manipulation, Spinal mobilization, Cryotherapy, Moist heat, Compression bandaging, scar mobilization, Splintting, Taping, Traction, Ultrasound, Ionotophoresis 4mg /ml Dexamethasone, and Manual therapy   PLAN FOR NEXT SESSION: static and dynamic balance  11:08 AM, 11/17/22 Rosamaria Lints, PT  physical therapy Ph:[phone]

## 2022-11-22 ENCOUNTER — Ambulatory Visit (HOSPITAL_COMMUNITY): Payer: Medicare HMO

## 2022-11-22 DIAGNOSIS — R262 Difficulty in walking, not elsewhere classified: Secondary | ICD-10-CM | POA: Diagnosis not present

## 2022-11-22 DIAGNOSIS — R2689 Other abnormalities of gait and mobility: Secondary | ICD-10-CM | POA: Diagnosis not present

## 2022-11-22 DIAGNOSIS — R2681 Unsteadiness on feet: Secondary | ICD-10-CM | POA: Diagnosis not present

## 2022-11-22 NOTE — Therapy (Signed)
OUTPATIENT PHYSICAL THERAPY TREATMENT   Patient Name: Nancy Williams MRN: [id number] DOB:Apr 24, 1940, 82 y.o., female Today's Date: 11/22/2022   PCP: Abbey Chatters, NP REFERRING PROVIDER: Glendale Chard, DO  END OF SESSION:  PT End of Session - 11/22/22 0935     Visit Number 4    Number of Visits 8    Date for PT Re-Evaluation 12/01/22    Authorization Type [employer]    PT Start Time 0930    PT Stop Time 1010    PT Time Calculation (min) 40 min    Activity Tolerance Patient tolerated treatment well    Behavior During Therapy WFL for tasks assessed/performed             Past Medical History:  Diagnosis Date   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    Hypothyroidism    Hashimoto's per [facility] New Patient Packet    Neuropathy    Osteoarthritis    Per patient at New patient appointment    Seasonal allergies    Vitamin D deficiency    Past Surgical History:  Procedure Laterality Date   No previous surgery     Patient Active Problem List   Diagnosis Date Noted   Hyperlipemia 09/05/2019   Hypothyroidism    Muscle weakness (generalized) 02/21/2012   Pain in joint, shoulder region 02/16/2012    ONSET DATE: 3 years  REFERRING DIAG: R26.81 (ICD-10-CM) - Unsteady gait  THERAPY DIAG:  Balance problem  Difficulty in walking, not elsewhere classified  Rationale for Evaluation and Treatment: Rehabilitation  SUBJECTIVE:                                                                                                                                                                                             SUBJECTIVE STATEMENT: No updates. HEP going fine. Still walking in the mornings.   From eval: Pt accompanied by: self  HISTORY OF PRESENT ILLNESS: Here at this clinic before in 2021 for balance.  Also for shoulder in 2014." I feel like I weave when I walk; seem to veer off to the left".   States she has been to chiropractor,  cardiologist, and neurologist.  Has an appointment with ENT next month. Never is dizzy.  MD wants her to try therapy again.  Only when walking.  Seems to be worse with fatigue.  Has to be very careful with uneven ground.  Does not use AD. Unable to line dance any more; lives alone but daughter and son live close by.  Walks a mile everyday; no longer takes gabapentin; only takes thyroid medicine now. Has been  tested for BPPV in the past not able to reproduce symptoms that way.  Has some mild  neuropathy in feet  Macular degeneration- geographic atrophy (a dry form of macular degeneration)  PAIN:  Are you having pain? No  PRECAUTIONS: Fall   WEIGHT BEARING RESTRICTIONS: No  FALLS: Has patient fallen in last 6 months? Yes. Number of falls 1  In March  PATIENT GOALS: to not lose my balance  OBJECTIVE:    TODAY'S TREATMENT:                                                                                                                              DATE:   11/22/22  -Nustep L4 x 5 min UEs/LEs, seat 7, arms 9  -lateral side stepping with red TB moved up to knees, cued for small steps, 4 lengths // bars, no hands   -retroAMB RTB at thighs 6 lengths, hands as needed   -STS from chair 1x5 hands free, good control  -STS from chair 1x8 hands free, feet on black foam pad   -airex pad stance horizontal and vertcla head turns x16 -walking obstacle course c 4" step up/down, airex pad up/down, foam beam  tandem walk, BOSU sunny side up(reactive step strategy) and half roller balance beam, then 180 degree turn around (2lb AW bilat) performed 8 round trips.      -on airex foam, min Guard assist: horizontal head turns, vertical head turns  11/17/22 -Nustep L4 x 4 min UEs/LEs, seat 7, arms 9 -lateral side stepping with red TB moved up to knees, cued for small steps 1x46ft bilat, 2 hand support -retro stepping and forward c RTB at knees and alternate forward 57ft each  -STS from chair 1x10 hands  free -on airex foam, min Guard assist: horizontal head turns, vertical head turns -eyes closed on foam 3x30secH, minguard to minA for LOB> 5  -balance challenge course: 2" firm step up, to floor, to 3" foam step up to floor to cone u-turn, then repeat in reverse: 5x with 2lb AW, red TB above knees.      PATIENT EDUCATION: Education details: Patient educated on exam findings, POC, scope of PT, HEP, and what to expect next visit. Encouraged a rollator Person educated: Patient Education method: Explanation, Demonstration, and Handouts Education comprehension: verbalized understanding, returned demonstration, verbal cues required, and tactile cues required  HOME EXERCISE PROGRAM: Access Code: [email or web address] URL: [email or web address] Date: 11/17/2022 Prepared by: Alvera Novel  Exercises - Sit to Stand  - 2 x daily - 7 x weekly - 1 sets - 10 reps - Resistance Pulldown with March  - 1 x daily - 7 x weekly - 2 sets - 30 sec hold - Tandem Walking with Counter Support  - 1 x daily - 7 x weekly - 2 sets - 10 reps - Side Stepping with Resistance at Thighs and Counter Support  - 1 x daily - 7 x weekly - 2 sets - 10  reps    GOALS: Goals reviewed with patient? No  SHORT TERM GOALS: Target date: 11/17/2022  patient will be independent with initial HEP  Baseline: Goal status: INITIAL  2.  Patient will self report 30% improvement to improve tolerance for functional activity  Baseline:  Goal status: INITIAL   LONG TERM GOALS: Target date: 12/01/2022  Patient will be independent in self management strategies to improve quality of life and functional outcomes.  Baseline:  Goal status: INITIAL  2.  Patient will self report 50% improvement to improve tolerance for functional activity  Baseline:  Goal status: INITIAL  3.  Patient will improve ABC confidence score to 78% or better to demonstrate improved funtional balance confidence Baseline: 73.75% Goal status: INITIAL  4.   Patient will improve DGI score to 17/24 to demonstrate improved functional gait and balance Baseline: 13/24 Goal status: INITIAL  5.  Patient will improve 5 times sit to stand score from 14.04 sec to 12 sec to demonstrate improved functional mobility and increased lower extremity strength.   Baseline: 14.04 Goal status: INITIAL  ASSESSMENT:  CLINICAL IMPRESSION: Continued to work on balance interventions. Soft compliant surfaces remain the most triggering with noted abnormal sway increased and delayed reaction time. Today,foam reactions are better. Will continue to advance as able so pt can meet LT goals.    OBJECTIVE IMPAIRMENTS: Abnormal gait, decreased balance, decreased coordination, decreased knowledge of condition, decreased knowledge of use of DME, difficulty walking, and impaired perceived functional ability.   ACTIVITY LIMITATIONS: carrying, lifting, standing, squatting, stairs, and locomotion level  PARTICIPATION LIMITATIONS: meal prep, cleaning, laundry, shopping, community activity, and yard work  [facility] POTENTIAL: Good  CLINICAL DECISION MAKING: Evolving/moderate complexity  EVALUATION COMPLEXITY: Moderate  PLAN:  PT FREQUENCY: 2x/week  PT DURATION: 4 weeks  PLANNED INTERVENTIONS: Therapeutic exercises, Therapeutic activity, Neuromuscular re-education, Balance training, Gait training, Patient/Family education, Joint manipulation, Joint mobilization, Stair training, Orthotic/Fit training, DME instructions, Aquatic Therapy, Dry Needling, Electrical stimulation, Spinal manipulation, Spinal mobilization, Cryotherapy, Moist heat, Compression bandaging, scar mobilization, Splintting, Taping, Traction, Ultrasound, Ionotophoresis 4mg /ml Dexamethasone, and Manual therapy   PLAN FOR NEXT SESSION: static and dynamic balance  9:36 AM, 11/22/22 Rosamaria Lints, PT [place] [phone]

## 2022-11-24 ENCOUNTER — Ambulatory Visit (HOSPITAL_COMMUNITY): Payer: Medicare HMO | Admitting: Physical Therapy

## 2022-11-24 DIAGNOSIS — R2689 Other abnormalities of gait and mobility: Secondary | ICD-10-CM | POA: Diagnosis not present

## 2022-11-24 DIAGNOSIS — R262 Difficulty in walking, not elsewhere classified: Secondary | ICD-10-CM

## 2022-11-24 DIAGNOSIS — R2681 Unsteadiness on feet: Secondary | ICD-10-CM | POA: Diagnosis not present

## 2022-11-24 NOTE — Therapy (Signed)
OUTPATIENT PHYSICAL THERAPY TREATMENT   Patient Name: Nancy Williams MRN: [id number] DOB:September 28, 1940, 82 y.o., female Today's Date: 11/24/2022   PCP: Abbey Chatters, NP REFERRING PROVIDER: Glendale Chard, DO  END OF SESSION:  PT End of Session - 11/24/22 0939     Visit Number 5    Number of Visits 8    Date for PT Re-Evaluation 12/01/22    Authorization Type [employer]    PT Start Time 0935    PT Stop Time 1015    PT Time Calculation (min) 40 min    Activity Tolerance Patient tolerated treatment well    Behavior During Therapy WFL for tasks assessed/performed             Past Medical History:  Diagnosis Date   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    Hypothyroidism    Hashimoto's per [facility] New Patient Packet    Neuropathy    Osteoarthritis    Per patient at New patient appointment    Seasonal allergies    Vitamin D deficiency    Past Surgical History:  Procedure Laterality Date   No previous surgery     Patient Active Problem List   Diagnosis Date Noted   Hyperlipemia 09/05/2019   Hypothyroidism    Muscle weakness (generalized) 02/21/2012   Pain in joint, shoulder region 02/16/2012    ONSET DATE: 3 years  REFERRING DIAG: R26.81 (ICD-10-CM) - Unsteady gait  THERAPY DIAG:  Balance problem  Difficulty in walking, not elsewhere classified  Rationale for Evaluation and Treatment: Rehabilitation  SUBJECTIVE:                                                                                                                                                                                             SUBJECTIVE STATEMENT: Pt reports compliance with her exercises.  Feels she's getting a little stronger but her balance is still off.   From eval: Pt accompanied by: self  HISTORY OF PRESENT ILLNESS: Here at this clinic before in 2021 for balance.  Also for shoulder in 2014." I feel like I weave when I walk; seem to veer off to the  left".   States she has been to chiropractor, cardiologist, and neurologist.  Has an appointment with ENT next month. Never is dizzy.  MD wants her to try therapy again.  Only when walking.  Seems to be worse with fatigue.  Has to be very careful with uneven ground.  Does not use AD. Unable to line dance any more; lives alone but daughter and son live close by.  Walks a mile everyday; no longer  takes gabapentin; only takes thyroid medicine now. Has been tested for BPPV in the past not able to reproduce symptoms that way.  Has some mild  neuropathy in feet  Macular degeneration- geographic atrophy (a dry form of macular degeneration)  PAIN:  Are you having pain? No  PRECAUTIONS: Fall   WEIGHT BEARING RESTRICTIONS: No  FALLS: Has patient fallen in last 6 months? Yes. Number of falls 1  In March  PATIENT GOALS: to not lose my balance  OBJECTIVE:    TODAY'S TREATMENT:                                                                                                                              DATE:  11/24/22 Nustep level 4 seat 7; UE/LE 5 minutes Standing:  heel raises no UE asisst 20x  Tandem stance on BB foam with 1# bar flexion 10X each LL lead  Standing on BB foam with head motions up/down and CGA  Vectors 10X3" each with 1 UE assist  Bodycraft walk outs 2PL 5X each with SB-CGA  Lunges onto BOSU dome up 10X each no UE assist  GTB single leg stance shoulder extension pull downs 10X each   11/22/22  -Nustep L4 x 5 min UEs/LEs, seat 7, arms 9  -lateral side stepping with red TB moved up to knees, cued for small steps, 4 lengths // bars, no hands   -retroAMB RTB at thighs 6 lengths, hands as needed   -STS from chair 1x5 hands free, good control  -STS from chair 1x8 hands free, feet on black foam pad   -airex pad stance horizontal and vertcla head turns x16 -walking obstacle course c 4" step up/down, airex pad up/down, foam beam  tandem walk, BOSU sunny side up(reactive step strategy)  and half roller balance beam, then 180 degree turn around (2lb AW bilat) performed 8 round trips.      -on airex foam, min Guard assist: horizontal head turns, vertical head turns  11/17/22 -Nustep L4 x 4 min UEs/LEs, seat 7, arms 9 -lateral side stepping with red TB moved up to knees, cued for small steps 1x22ft bilat, 2 hand support -retro stepping and forward c RTB at knees and alternate forward 25ft each  -STS from chair 1x10 hands free -on airex foam, min Guard assist: horizontal head turns, vertical head turns -eyes closed on foam 3x30secH, minguard to minA for LOB> 5  -balance challenge course: 2" firm step up, to floor, to 3" foam step up to floor to cone u-turn, then repeat in reverse: 5x with 2lb AW, red TB above knees.      PATIENT EDUCATION: Education details: Patient educated on exam findings, POC, scope of PT, HEP, and what to expect next visit. Encouraged a rollator Person educated: Patient Education method: Explanation, Demonstration, and Handouts Education comprehension: verbalized understanding, returned demonstration, verbal cues required, and tactile cues required  HOME EXERCISE PROGRAM: Access Code: [email or web address] URL: [email or web address] Date: 11/17/2022  Prepared by: Alvera Novel  Exercises - Sit to Stand  - 2 x daily - 7 x weekly - 1 sets - 10 reps - Resistance Pulldown with March  - 1 x daily - 7 x weekly - 2 sets - 30 sec hold - Tandem Walking with Counter Support  - 1 x daily - 7 x weekly - 2 sets - 10 reps - Side Stepping with Resistance at Thighs and Counter Support  - 1 x daily - 7 x weekly - 2 sets - 10 reps    GOALS: Goals reviewed with patient? No  SHORT TERM GOALS: Target date: 11/17/2022  patient will be independent with initial HEP  Baseline: Goal status: INITIAL  2.  Patient will self report 30% improvement to improve tolerance for functional activity  Baseline:  Goal status: INITIAL   LONG TERM GOALS: Target date:  12/01/2022  Patient will be independent in self management strategies to improve quality of life and functional outcomes.  Baseline:  Goal status: INITIAL  2.  Patient will self report 50% improvement to improve tolerance for functional activity  Baseline:  Goal status: INITIAL  3.  Patient will improve ABC confidence score to 78% or better to demonstrate improved funtional balance confidence Baseline: 73.75% Goal status: INITIAL  4.  Patient will improve DGI score to 17/24 to demonstrate improved functional gait and balance Baseline: 13/24 Goal status: INITIAL  5.  Patient will improve 5 times sit to stand score from 14.04 sec to 12 sec to demonstrate improved functional mobility and increased lower extremity strength.   Baseline: 14.04 Goal status: INITIAL  ASSESSMENT:  CLINICAL IMPRESSION: Focus remains on balance/stability challenges.  Balance beam stance with UE tasks required CGA-minA due to LOB.  Began resisted walkouts with Rt lateral and forward pulls being most challenging to control during eccentric return.  Single leg UE pull downs required CGA to maintain and cues for posturing/form.  Pt given anchor for thera band at home to use as she was only attaching to door knob.  Pt able to complete all activities without rest break or issue.  Pt will continue to benefit from skilled therapy to improve her stability and increase her overall functional status.   OBJECTIVE IMPAIRMENTS: Abnormal gait, decreased balance, decreased coordination, decreased knowledge of condition, decreased knowledge of use of DME, difficulty walking, and impaired perceived functional ability.   ACTIVITY LIMITATIONS: carrying, lifting, standing, squatting, stairs, and locomotion level  PARTICIPATION LIMITATIONS: meal prep, cleaning, laundry, shopping, community activity, and yard work  [facility] POTENTIAL: Good  CLINICAL DECISION MAKING: Evolving/moderate complexity  EVALUATION COMPLEXITY:  Moderate  PLAN:  PT FREQUENCY: 2x/week  PT DURATION: 4 weeks  PLANNED INTERVENTIONS: Therapeutic exercises, Therapeutic activity, Neuromuscular re-education, Balance training, Gait training, Patient/Family education, Joint manipulation, Joint mobilization, Stair training, Orthotic/Fit training, DME instructions, Aquatic Therapy, Dry Needling, Electrical stimulation, Spinal manipulation, Spinal mobilization, Cryotherapy, Moist heat, Compression bandaging, scar mobilization, Splintting, Taping, Traction, Ultrasound, Ionotophoresis 4mg /ml Dexamethasone, and Manual therapy   PLAN FOR NEXT SESSION: Continue focus on static and dynamic balance  11:08 AM, 11/24/22 Lurena Nida, PTA [facility] [phone]

## 2022-11-28 ENCOUNTER — Ambulatory Visit (HOSPITAL_COMMUNITY): Payer: Medicare HMO

## 2022-11-28 DIAGNOSIS — R2689 Other abnormalities of gait and mobility: Secondary | ICD-10-CM | POA: Diagnosis not present

## 2022-11-28 DIAGNOSIS — R2681 Unsteadiness on feet: Secondary | ICD-10-CM | POA: Diagnosis not present

## 2022-11-28 DIAGNOSIS — R262 Difficulty in walking, not elsewhere classified: Secondary | ICD-10-CM

## 2022-11-28 NOTE — Therapy (Signed)
OUTPATIENT PHYSICAL THERAPY TREATMENT   Patient Name: Nancy Williams MRN: [id number] DOB:1940/09/12, 82 y.o., female Today's Date: 11/28/2022   PCP: Abbey Chatters, NP REFERRING PROVIDER: Glendale Chard, DO  END OF SESSION:  PT End of Session - 11/28/22 1018     Visit Number 6    Number of Visits 8    Date for PT Re-Evaluation 12/01/22    Authorization Type [employer]    Progress Note Due on Visit 10    PT Start Time 1015    PT Stop Time 1055    PT Time Calculation (min) 40 min    Activity Tolerance Patient tolerated treatment well    Behavior During Therapy WFL for tasks assessed/performed             Past Medical History:  Diagnosis Date   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    Hypothyroidism    Hashimoto's per [facility] New Patient Packet    Neuropathy    Osteoarthritis    Per patient at New patient appointment    Seasonal allergies    Vitamin D deficiency    Past Surgical History:  Procedure Laterality Date   No previous surgery     Patient Active Problem List   Diagnosis Date Noted   Hyperlipemia 09/05/2019   Hypothyroidism    Muscle weakness (generalized) 02/21/2012   Pain in joint, shoulder region 02/16/2012    ONSET DATE: 3 years  REFERRING DIAG: R26.81 (ICD-10-CM) - Unsteady gait  THERAPY DIAG:  Balance problem  Difficulty in walking, not elsewhere classified  Rationale for Evaluation and Treatment: Rehabilitation  SUBJECTIVE:                                                                                                                                                                                             SUBJECTIVE STATEMENT: Pt reports compliance with her exercises.  Feels she's getting a little stronger but her balance is still off.   From eval: Pt accompanied by: self  HISTORY OF PRESENT ILLNESS: Here at this clinic before in 2021 for balance.  Also for shoulder in 2014." I feel like I weave when I  walk; seem to veer off to the left".   States she has been to chiropractor, cardiologist, and neurologist.  Has an appointment with ENT next month. Never is dizzy.  MD wants her to try therapy again.  Only when walking.  Seems to be worse with fatigue.  Has to be very careful with uneven ground.  Does not use AD. Unable to line dance any more; lives alone but daughter and son live  close by.  Walks a mile everyday; no longer takes gabapentin; only takes thyroid medicine now. Has been tested for BPPV in the past not able to reproduce symptoms that way.  Has some mild  neuropathy in feet  Macular degeneration- geographic atrophy (a dry form of macular degeneration)  PAIN:  Are you having pain? No  PRECAUTIONS: Fall   WEIGHT BEARING RESTRICTIONS: No  FALLS: Has patient fallen in last 6 months? Yes. Number of falls 1  In March  PATIENT GOALS: to not lose my balance  OBJECTIVE:    TODAY'S TREATMENT:                                                                                                                              DATE:   11/28/22 -Nustep x4 minutes, level 4, UE/LE #5 -5 laps around building with 2lb AW, red TB around thighs, multiple step ups/downs, foam, bosu, and helaf roll balacn ebeam, then up  8" stairs, down 4" stairs and backward down main hallway.  -3lbAW with removal of 5 items varryign weight and size up to 11 lbs from storage area to table or high stool as cued; AMB over 2" foam step up, 4" firm step up, 2" frim step up, 2" foam step down, step over hurdle, step over half roll and 180 degree turnaround x10 total  11/24/22 Nustep level 4 seat 7; UE/LE 5 minutes Standing:  heel raises no UE asisst 20x  Tandem stance on BB foam with 1# bar flexion 10X each LL lead  Standing on BB foam with head motions up/down and CGA  Vectors 10X3" each with 1 UE assist  Bodycraft walk outs 2PL 5X each with SB-CGA  Lunges onto BOSU dome up 10X each no UE assist  GTB single leg stance  shoulder extension pull downs 10X each   11/22/22  -Nustep L4 x 5 min UEs/LEs, seat 7, arms 9  -lateral side stepping with red TB moved up to knees, cued for small steps, 4 lengths // bars, no hands   -retroAMB RTB at thighs 6 lengths, hands as needed   -STS from chair 1x5 hands free, good control  -STS from chair 1x8 hands free, feet on black foam pad   -airex pad stance horizontal and vertcla head turns x16 -walking obstacle course c 4" step up/down, airex pad up/down, foam beam  tandem walk, BOSU sunny side up(reactive step strategy) and half roller balance beam, then 180 degree turn around (2lb AW bilat) performed 8 round trips.  -on airex foam, min Guard assist: horizontal head turns, vertical head turns    11/17/22 -Nustep L4 x 4 min UEs/LEs, seat 7, arms 9 -lateral side stepping with red TB moved up to knees, cued for small steps 1x77ft bilat, 2 hand support -retro stepping and forward c RTB at knees and alternate forward 67ft each  -STS from chair 1x10 hands free -on airex foam, min Guard assist: horizontal head turns,  vertical head turns -eyes closed on foam 3x30secH, minguard to minA for LOB> 5  -balance challenge course: 2" firm step up, to floor, to 3" foam step up to floor to cone u-turn, then repeat in reverse: 5x with 2lb AW, red TB above knees.      PATIENT EDUCATION: Education details: Patient educated on exam findings, POC, scope of PT, HEP, and what to expect next visit. Encouraged a rollator Person educated: Patient Education method: Explanation, Demonstration, and Handouts Education comprehension: verbalized understanding, returned demonstration, verbal cues required, and tactile cues required  HOME EXERCISE PROGRAM: Access Code: [email or web address] URL: [email or web address] Date: 11/17/2022 Prepared by: Alvera Novel  Exercises - Sit to Stand  - 2 x daily - 7 x weekly - 1 sets - 10 reps - Resistance Pulldown with March  - 1 x daily - 7 x weekly - 2  sets - 30 sec hold - Tandem Walking with Counter Support  - 1 x daily - 7 x weekly - 2 sets - 10 reps - Side Stepping with Resistance at Thighs and Counter Support  - 1 x daily - 7 x weekly - 2 sets - 10 reps    GOALS: Goals reviewed with patient? No  SHORT TERM GOALS: Target date: 11/17/2022  patient will be independent with initial HEP  Baseline: Goal status: INITIAL  2.  Patient will self report 30% improvement to improve tolerance for functional activity  Baseline:  Goal status: INITIAL   LONG TERM GOALS: Target date: 12/01/2022  Patient will be independent in self management strategies to improve quality of life and functional outcomes.  Baseline:  Goal status: INITIAL  2.  Patient will self report 50% improvement to improve tolerance for functional activity  Baseline:  Goal status: INITIAL  3.  Patient will improve ABC confidence score to 78% or better to demonstrate improved funtional balance confidence Baseline: 73.75% Goal status: INITIAL  4.  Patient will improve DGI score to 17/24 to demonstrate improved functional gait and balance Baseline: 13/24 Goal status: INITIAL  5.  Patient will improve 5 times sit to stand score from 14.04 sec to 12 sec to demonstrate improved functional mobility and increased lower extremity strength.   Baseline: 14.04 Goal status: INITIAL  ASSESSMENT:  CLINICAL IMPRESSION:  Continued transition from static/dynamic balance to gait based balance training. Progress as tolerated. Retest balance outcome measures. Pt shows no significant LOB in session, is improving ability to do these tasks with fewer LOB. Pt will continue to benefit from skilled therapy to improve her stability and increase her overall functional status.   OBJECTIVE IMPAIRMENTS: Abnormal gait, decreased balance, decreased coordination, decreased knowledge of condition, decreased knowledge of use of DME, difficulty walking, and impaired perceived functional ability.    ACTIVITY LIMITATIONS: carrying, lifting, standing, squatting, stairs, and locomotion level  PARTICIPATION LIMITATIONS: meal prep, cleaning, laundry, shopping, community activity, and yard work  [facility] POTENTIAL: Good  CLINICAL DECISION MAKING: Evolving/moderate complexity  EVALUATION COMPLEXITY: Moderate  PLAN:  PT FREQUENCY: 2x/week  PT DURATION: 4 weeks  PLANNED INTERVENTIONS: Therapeutic exercises, Therapeutic activity, Neuromuscular re-education, Balance training, Gait training, Patient/Family education, Joint manipulation, Joint mobilization, Stair training, Orthotic/Fit training, DME instructions, Aquatic Therapy, Dry Needling, Electrical stimulation, Spinal manipulation, Spinal mobilization, Cryotherapy, Moist heat, Compression bandaging, scar mobilization, Splintting, Taping, Traction, Ultrasound, Ionotophoresis 4mg /ml Dexamethasone, and Manual therapy   PLAN FOR NEXT SESSION: Continue focus on static and dynamic balance     10:19 AM, 11/28/22 Nancy Williams, PT [facility] Ph:[phone]

## 2022-11-30 ENCOUNTER — Encounter (HOSPITAL_COMMUNITY): Payer: Self-pay

## 2022-11-30 ENCOUNTER — Ambulatory Visit (HOSPITAL_COMMUNITY): Payer: Medicare HMO

## 2022-11-30 DIAGNOSIS — R2681 Unsteadiness on feet: Secondary | ICD-10-CM | POA: Diagnosis not present

## 2022-11-30 DIAGNOSIS — R262 Difficulty in walking, not elsewhere classified: Secondary | ICD-10-CM | POA: Diagnosis not present

## 2022-11-30 DIAGNOSIS — R2689 Other abnormalities of gait and mobility: Secondary | ICD-10-CM

## 2022-11-30 NOTE — Therapy (Addendum)
OUTPATIENT PHYSICAL THERAPY TREATMENT / PROGRESS NOTE  Progress Note Reporting Period 11/03/22 to 11/30/22  See note below for Objective Data and Assessment of Progress/Goals.   PHYSICAL THERAPY DISCHARGE SUMMARY  Visits from Start of Care: 7  Current functional level related to goals / functional outcomes: Independent with HEP and feels that PT has helped her with daily function. See below.   Remaining deficits: Still experiences some LOB during daily and feels the need to watch her feet while walking. See below.   Education / Equipment: HEP   Patient agrees to discharge. Patient goals were partially met. Patient is being discharged due to being pleased with the current functional level.   Patient Name: Nancy Williams MRN: [id number] DOB:[birth year], 82 y.o., female Today's Date: 11/30/2022   PCP: Abbey Chatters, NP REFERRING PROVIDER: Glendale Chard, DO  END OF SESSION:  PT End of Session - 11/30/22 1007     Visit Number 7    Number of Visits 8    Date for PT Re-Evaluation 12/01/22    Authorization Type [employer]    Progress Note Due on Visit 10    PT Start Time 1010    PT Stop Time 1050    PT Time Calculation (min) 40 min    Activity Tolerance Patient tolerated treatment well    Behavior During Therapy WFL for tasks assessed/performed             Past Medical History:  Diagnosis Date   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    Hypothyroidism    Hashimoto's per [facility] New Patient Packet    Neuropathy    Osteoarthritis    Per patient at New patient appointment    Seasonal allergies    Vitamin D deficiency    Past Surgical History:  Procedure Laterality Date   No previous surgery     Patient Active Problem List   Diagnosis Date Noted   Hyperlipemia 09/05/2019   Hypothyroidism    Muscle weakness (generalized) 02/21/2012   Pain in joint, shoulder region 02/16/2012    ONSET DATE: 3 years  REFERRING DIAG: R26.81  (ICD-10-CM) - Unsteady gait  THERAPY DIAG:  Difficulty in walking, not elsewhere classified  Balance problem  Rationale for Evaluation and Treatment: Rehabilitation  SUBJECTIVE:                                                                                                                                                                                              SUBJECTIVE STATEMENT: 11/30/22: No changes since last session. Today is her usual; no worse or better. Feels that exercises  are helping and she's able to keep up with them 3x a week. Believes that this balance problem is something that she will have to manage the rest of her life. Patient mentioned she has felt that her right ear has been "stopped up" for the past month. Has taken Claritin and ibuprofen but doesn't notice any change. Denies any ringing or loss of hearing.   From eval: Pt accompanied by: self  HISTORY OF PRESENT ILLNESS: Here at this clinic before in 2021 for balance.  Also for shoulder in 2014." I feel like I weave when I walk; seem to veer off to the left".   States she has been to chiropractor, cardiologist, and neurologist.  Has an appointment with ENT next month. Never is dizzy.  MD wants her to try therapy again.  Only when walking.  Seems to be worse with fatigue.  Has to be very careful with uneven ground.  Does not use AD. Unable to line dance any more; lives alone but daughter and son live close by.  Walks a mile everyday; no longer takes gabapentin; only takes thyroid medicine now. Has been tested for BPPV in the past not able to reproduce symptoms that way.  Has some mild  neuropathy in feet  Macular degeneration- geographic atrophy (a dry form of macular degeneration)  PAIN:  Are you having pain? No  PRECAUTIONS: Fall   WEIGHT BEARING RESTRICTIONS: No  FALLS: Has patient fallen in last 6 months? Yes. Number of falls 1  In March  PATIENT GOALS: to not lose my balance  OBJECTIVE:    TODAY'S  TREATMENT:                                                                                                                              DATE:  11/30/22  - Progress note Nustep warm up for 5 min, seat 7, level 4 ABC scale: 73.12% 5x STS: 11.08 DGI 1. Gait level surface (3) Normal: Walks 20', no assistive devices, good sped, no evidence for imbalance, normal gait pattern 2. Change in gait speed (2) Mild Impairment: Is able to change speed but demonstrates mild gait deviations, or not gait deviations but unable to achieve a significant change in velocity, or uses an assistive device. 3. Gait with horizontal head turns (1) Moderate Impairment: Performs head turns with moderate change in gait velocity, slows down, staggers but recovers, can continue to walk. 4. Gait with vertical head turns (0) Severe Impairment: Performs task with severe disruption of gait, i.e., staggers outside 15" path, loses balance, stops, reaches for wall. 5. Gait and pivot turn (2) Mild Impairment: Pivot turns safely in > 3 seconds and stops with no loss of balance. 6. Step over obstacle (3) Normal: Is able to step over the box without changing gait speed, no evidence of imbalance. 7. Step around obstacles (3) Normal: Is able to walk around cones safely without changing gait speed; no evidence of imbalance. 8.  Stairs (3) Normal: Alternating feet, no rail.  TOTAL SCORE: 17 / 24   Clinical Test of Sensory Interaction for Balance    (CTSIB): - If a patient is unable to maintain the position for 30 seconds they are provided with 2 additional attempts. The scores of the 3 trials are averaged. - Each condition is tested 3 times and a value of 1 to 4 to characterize the sway. 1= minimal, 2-sway, 4 = a fall  CONDITION TIME STRATEGY SWAY  Eyes open, firm surface  30 seconds    Eyes closed, firm surface 30 seconds ankle +1  Eyes open, foam surface 30 seconds ankle +1  Eyes closed, foam surface 4 seconds ankle, hip +2   -  felt "worse" and "very unstable" during 4th condition.  11/28/22 -Nustep x4 minutes, level 4, UE/LE #5 -5 laps around building with 2lb AW, red TB around thighs, multiple step ups/downs, foam, bosu, and helaf roll balacn ebeam, then up  8" stairs, down 4" stairs and backward down main hallway.  -3lbAW with removal of 5 items varryign weight and size up to 11 lbs from storage area to table or high stool as cued; AMB over 2" foam step up, 4" firm step up, 2" frim step up, 2" foam step down, step over hurdle, step over half roll and 180 degree turnaround x10 total  11/24/22 Nustep level 4 seat 7; UE/LE 5 minutes Standing:  heel raises no UE asisst 20x  Tandem stance on BB foam with 1# bar flexion 10X each LL lead  Standing on BB foam with head motions up/down and CGA  Vectors 10X3" each with 1 UE assist  Bodycraft walk outs 2PL 5X each with SB-CGA  Lunges onto BOSU dome up 10X each no UE assist  GTB single leg stance shoulder extension pull downs 10X each   11/22/22  -Nustep L4 x 5 min UEs/LEs, seat 7, arms 9  -lateral side stepping with red TB moved up to knees, cued for small steps, 4 lengths // bars, no hands   -retroAMB RTB at thighs 6 lengths, hands as needed   -STS from chair 1x5 hands free, good control  -STS from chair 1x8 hands free, feet on black foam pad   -airex pad stance horizontal and vertcla head turns x16 -walking obstacle course c 4" step up/down, airex pad up/down, foam beam  tandem walk, BOSU sunny side up(reactive step strategy) and half roller balance beam, then 180 degree turn around (2lb AW bilat) performed 8 round trips.  -on airex foam, min Guard assist: horizontal head turns, vertical head turns    11/17/22 -Nustep L4 x 4 min UEs/LEs, seat 7, arms 9 -lateral side stepping with red TB moved up to knees, cued for small steps 1x26ft bilat, 2 hand support -retro stepping and forward c RTB at knees and alternate forward 2ft each  -STS from chair 1x10 hands  free -on airex foam, min Guard assist: horizontal head turns, vertical head turns -eyes closed on foam 3x30secH, minguard to minA for LOB> 5  -balance challenge course: 2" firm step up, to floor, to 3" foam step up to floor to cone u-turn, then repeat in reverse: 5x with 2lb AW, red TB above knees.    PATIENT EDUCATION: Education details: Patient educated on exam findings, POC, scope of PT, HEP, and what to expect next visit. Encouraged a rollator Person educated: Patient Education method: Explanation, Demonstration, and Handouts Education comprehension: verbalized understanding, returned demonstration, verbal cues required, and tactile  cues required  HOME EXERCISE PROGRAM: Access Code: [email or web address] URL: [email or web address] Date: 11/17/2022 Prepared by: Alvera Novel  Exercises - Sit to Stand  - 2 x daily - 7 x weekly - 1 sets - 10 reps - Resistance Pulldown with March  - 1 x daily - 7 x weekly - 2 sets - 30 sec hold - Tandem Walking with Counter Support  - 1 x daily - 7 x weekly - 2 sets - 10 reps - Side Stepping with Resistance at Thighs and Counter Support  - 1 x daily - 7 x weekly - 2 sets - 10 reps    GOALS: Goals reviewed with patient? No  SHORT TERM GOALS: Target date: 11/17/2022  patient will be independent with initial HEP  Baseline: Goal status: MET  2.  Patient will self report 30% improvement to improve tolerance for functional activity  Baseline:  Goal status: MET   LONG TERM GOALS: Target date: 12/01/2022  Patient will be independent in self management strategies to improve quality of life and functional outcomes.  Baseline:  Goal status: MET  2.  Patient will self report 50% improvement to improve tolerance for functional activity  Baseline:  Progress: 40% Goal status: IN PROGRESS  3.  Patient will improve ABC confidence score to 78% or better to demonstrate improved funtional balance confidence Baseline: 73.75% Progress: 73.12% Goal  status: MINIMAL CHANGE  4.  Patient will improve DGI score to 17/24 to demonstrate improved functional gait and balance Baseline: 13/24 Progress: 17/24 Goal status: MET  5.  Patient will improve 5 times sit to stand score from 14.04 sec to 12 sec to demonstrate improved functional mobility and increased lower extremity strength.   Baseline: 14.04 Progress: 11.08 Goal status: MET  ASSESSMENT:  CLINICAL IMPRESSION: Discussed Rolator use for unfamiliar environment. Patient compliant with HEP and understands value of maintaining compliance. Goals met for 5xSTS and DGI, but not for ABC score. CTSIB-M results a failure on 4th condition (eyes closed on foam pad) which indicates vestibular dysfunction. Other test conditions show sufficient visual and somatosensory function. Patient educated on results and told to monitor right sided aural fullness until ENT appointment.   OBJECTIVE IMPAIRMENTS: Abnormal gait, decreased balance, decreased coordination, decreased knowledge of condition, decreased knowledge of use of DME, difficulty walking, and impaired perceived functional ability.   ACTIVITY LIMITATIONS: carrying, lifting, standing, squatting, stairs, and locomotion level  PARTICIPATION LIMITATIONS: meal prep, cleaning, laundry, shopping, community activity, and yard work  [facility] POTENTIAL: Good  CLINICAL DECISION MAKING: Evolving/moderate complexity  EVALUATION COMPLEXITY: Moderate  PLAN:  PT FREQUENCY: 2x/week  PT DURATION: 4 weeks  PLANNED INTERVENTIONS: Therapeutic exercises, Therapeutic activity, Neuromuscular re-education, Balance training, Gait training, Patient/Family education, Joint manipulation, Joint mobilization, Stair training, Orthotic/Fit training, DME instructions, Aquatic Therapy, Dry Needling, Electrical stimulation, Spinal manipulation, Spinal mobilization, Cryotherapy, Moist heat, Compression bandaging, scar mobilization, Splintting, Taping, Traction, Ultrasound,  Ionotophoresis 4mg /ml Dexamethasone, and Manual therapy   PLAN FOR NEXT SESSION:  Patient discharged.     11:54 AM, 11/30/22 Saralyn Pilar, Student-PT [place] [facility] [phone]  " I agree with the following treatment note after reviewing documentation. This session was performed under the supervision of a licensed clinician."    12:37 PM, 11/30/22 Amy Small Lynch MPT [place] [facility] [place] [phone]

## 2022-12-06 ENCOUNTER — Encounter (HOSPITAL_COMMUNITY): Payer: Medicare HMO | Admitting: Physical Therapy

## 2022-12-08 ENCOUNTER — Encounter (HOSPITAL_COMMUNITY): Payer: Medicare HMO

## 2022-12-16 ENCOUNTER — Encounter (INDEPENDENT_AMBULATORY_CARE_PROVIDER_SITE_OTHER): Payer: Self-pay | Admitting: Otolaryngology

## 2022-12-16 ENCOUNTER — Ambulatory Visit (INDEPENDENT_AMBULATORY_CARE_PROVIDER_SITE_OTHER): Payer: Medicare HMO | Admitting: Otolaryngology

## 2022-12-16 VITALS — BP 149/73 | HR 63 | Ht 60.0 in | Wt 132.0 lb

## 2022-12-16 DIAGNOSIS — R2681 Unsteadiness on feet: Secondary | ICD-10-CM

## 2022-12-16 DIAGNOSIS — G629 Polyneuropathy, unspecified: Secondary | ICD-10-CM

## 2022-12-16 DIAGNOSIS — R2689 Other abnormalities of gait and mobility: Secondary | ICD-10-CM

## 2022-12-16 DIAGNOSIS — H6121 Impacted cerumen, right ear: Secondary | ICD-10-CM | POA: Diagnosis not present

## 2022-12-16 NOTE — Progress Notes (Signed)
ENT CONSULT:  Reason for Consult: balance issues x 3 yrs    HPI: Discussed the use of AI scribe software for clinical note transcription with the patient, who gave verbal consent to proceed.  History of Present Illness   The patient is a pleasant 82 yoF, with a history of macular degeneration and neuropathy, presents with longstanding balance issues.   She describes a sensation of 'floating' and veering to the left when walking, often resulting in collisions with walls. The patient denies any associated dizziness or vertigo. Despite these balance issues, the patient maintains an active lifestyle, walking a mile daily and performing core-strengthening exercises.  The patient has sought multiple opinions for this issue, including neurology, cardiology, and nutrition, with no definitive diagnosis. She has undergone physical therapy, which did not yield significant improvement. An MRI brain and CT scan of head/cervical spine/max/face were performed, with no abnormal findings (CT scans were done 02/12/22 after a fall). The patient also underwent a detox for suspected heavy metal toxicity, which was discontinued due to adverse effects (this was recommended by Naturopath/Functional Medicine specialist).  The patient's balance issues are constant and have led to at least one fall. Despite the persistent balance issues, the patient is reluctant to use assistive devices such as a cane. The patient's daughter, who often accompanies her, describes the patient's gait as 'wobbly' and akin to appearing 'drunk.'  The patient also has a history of arthritis in the shoulder pain, which was initially suspected to contribute to the balance issues. However, this was ruled out per report. The patient also reports a history of neuropathy, which has been evaluated by a neurologist and deemed unrelated to the current balance issues.  The patient's balance issues remain a significant concern, impacting her daily activities  and quality of life. Despite extensive evaluation and intervention, the cause remains elusive, and the symptoms persist.     Records Reviewed:  Neurology note 10/31/22 IMPRESSION/PLAN: Unsteady gait, unclear etiology.  She has mild neuropathy distally in the feet, which would not cause this degree of unsteadiness.  MRI lumbar spine was reviewed and shows mild degenerative changes, none which would explain her imbalance.  MRI brain was personally viewed and looks great, no evidence of NPH or cerebellar atrophy. Exam does not show any neurodegenerative condition, such as parkinson's.   I explained that I do not have a good answer for her symptoms.  Management may be supportive, such that I emphasized fall prevention and balance training.  She is interested in PT for balance.  I also recommend adding grab bars in the shower and that she use a rollator for stability.    --------------------------------------------- History of present illness: She was diagnosed with neuropathy 18 years ago manifesting with burning pain.  She was recommended to wear good shoes which alleviated her burning pain.  She now has numbness in the soles of the feet, but no tingling or pain.   Over the past 6 months, she has noticed greater problems with imbalance which has progressively been getting worse.  She walks unassisted, but tends to lean on things for support.  She has not suffered any falls.  She lives alone in a one-level home.  She retired from office work.  Her drives, manages medication and finances, and does her own household chores.    Her daughter also says that she has spells where she appears with a blank stare.  Her memory is not as great and for the past 2 years.  Patient feels  that she is not "right", but unable to find a reason for this.  They would like MRI brain.    UPDATE 10/31/2022:  She complains unsteadiness for the past 82 years.  There is no dizziness or spinning. She had one fall climbing stairs down, but  reports that she was holding items in her hand.  She walks unassisted but always feels like she is swaying towards the left.  Symptoms are constant.  She does not use gait assist device.  She does endorse holding on to walls/furniture.  Despite this, she walks 1 mile daily. She denies leg weakness, neck pain, back pain, or joint pain.  She has mild numbness in the soles of the feet.   Cardiology office note 03/14/22 SHAWNTIA SANTELL is a 82 y.o. female  with past medical history of hypothyroidism, osteoarthritis and palpitations (s/p monitor in 05/2019 showing brief SVT and 4% PVC burden).   Patient last saw Ms Iran Ouch 01/2021 and having atypical chest pain and progressive fatigue. Because of strong gamily history of CAD a Coronary CTA was ordered. Also having more palpitations so Toprol XL increased 25 mg daily. Calcium score 21 and mild nonobstructive CAD.   She called in 01/2021 having low BP's and stopped Toprol and started Magnesium for palpitations. She was in ED with fall and head laceration 02/12/22-tripped while carrying a box down steps. When she gets up in the am she gets a jittery feeling, weak and tired. Improves as the day goes on. Balance has been off. She asked her PCP to lower her synthroid. She's been to functional doctors, [facility], neurology and many physicians. She used to dance but can't do anything. Walks 1/4 mile 2-3 days/week. Was taking gabapentin prn but now taking twice daily and seems to be helping.   Palpitations/SVT-Toprol stopped by patient b/c of low BP's-doesn't think it ever helped. Still has jittery sensation in her chest daily when she wakes up. Makes her feel poorly the rest of the day. Has been to multiple physicians and takes multiple supplements. Will place 7 day Zio. Encourage her to see endocrinologist.   CAD mild nonobstructive   Hypothyroidism taking synthroid and I thyroid from function medicine. For blood work later this month. Encourage her  to see endocrinologist.    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    Hypothyroidism    Hashimoto's per [facility] New Patient Packet    Neuropathy    Osteoarthritis    Per patient at New patient appointment    Seasonal allergies    Vitamin D deficiency     Past Surgical History:  Procedure Laterality Date   No previous surgery      Family History  Problem Relation Age of Onset   Heart disease Mother    Heart failure Mother    Heart disease Father    Prostate cancer Father    Heart disease Son    Diabetes Son    Prostate cancer Son    Thyroid disease Son    Hashimoto's thyroiditis Daughter     Social History:  reports that she has never smoked. She has never used smokeless tobacco. She reports current alcohol use. She reports that she does not use drugs.  Allergies: No Known Allergies  Medications: I have reviewed the patient's current medications.  The PMH, PSH, Medications, Allergies, and SH were reviewed and updated.  ROS: Constitutional:  Negative for fever, weight loss and weight gain. Cardiovascular: Negative for chest pain and dyspnea on exertion. Respiratory: Is not experiencing shortness of breath at rest. Gastrointestinal: Negative for nausea and vomiting. Neurological: Negative for headaches. Psychiatric: The patient is not nervous/anxious  Blood pressure (!) 149/73, pulse 63, height 5' (1.524 m), weight 132 lb (59.9 kg), SpO2 97%.  PHYSICAL EXAM:  Exam: General: Well-developed, well-nourished Respiratory Respiratory effort: Equal inspiration and expiration without stridor Cardiovascular Peripheral Vascular: Warm extremities with equal color/perfusion Eyes: No nystagmus with equal extraocular motion bilaterally Neuro/Psych/Balance: Patient oriented to person, place, and time; Appropriate mood and affect; Gait is intact with no imbalance; Cranial  nerves I-XII are intact. Positive Romberg test (sways with eyes closed, able to stand with eyes open) Head and Face Inspection: Normocephalic and atraumatic without mass or lesion Palpation: Facial skeleton intact without bony stepoffs Salivary Glands: No mass or tenderness Facial Strength: Facial motility symmetric and full bilaterally ENT Pinna: External ear intact and fully developed External canal: Canal is patent with intact skin, had cerumen removed on the right side  Tympanic Membrane: Clear and mobile External Nose: No scar or anatomic deformity Internal Nose: Septum intact and midline. No edema, polyp, or rhinorrhea Lips, Teeth, and gums: Mucosa and teeth intact and viable Oral cavity/oropharynx: No erythema or exudate, no lesions present Neck Neck and Trachea: Midline trachea without mass or lesion Thyroid: No mass or nodularity Lymphatics: No lymphadenopathy  Procedure: Procedure: Cerumen Removal, right (CPT [phone])  Diagnosis: cerumen impaction, right side  Informed consent: Timeout performed and informed consent was obtained.  Procedure: Operating microscope was employed to evaluate the ear(s).  Cerumen curette, speculum and suction were employed to clear the cerumen.   Findings: Normal appearing tympanic membranes without perforations, and external canals are normal after removal of cerumen.No middle ear fluid bilaterally.   Complications: None. Patient tolerated well.   Studies Reviewed: NCS/EMG of the left hand 09/02/2020: Left median neuropathy at or distal to the wrist, consistent with a clinical diagnosis of carpal tunnel syndrome.  Overall, these findings are moderate-to-severe in degree electrically.   MRI brain wo contrast 12/04/2020: Age normal brain MRI.  No explanation for symptoms.    MRI lumbar spine wo contrast 12/04/2020: 1. Multilevel lumbar spondylosis as described above, slightly progressed at L4-L5. No high-grade spinal canal or  neuroforaminal stenosis at any level.   Assessment/Plan: Encounter Diagnoses  Name Primary?   Balance disorder Yes   Impacted cerumen of right ear    Neuropathy    Gait instability     Assessment and Plan    Chronic balance Issues Chronic balance issues characterized by a feeling of floating, veering to the left while walking, and occasional falls/gait instability. No dizziness or vertigo. Previous evaluations include normal brain MRI (2022) c-spine MRI, CT scans following a fall (including brain, max/face and c-spine 01/2022), and cardiology workup. Physical therapy for balance issues attempted without significant improvement. Differential diagnosis includes vestibular hypofunction, neuropathy, and potential vestibular nerve tumors, although latter is less likely 2/2 no hearing changes and other findings. She had (+) Romberg on exam today which is c/w vestibular weakness vs neuropathy. Exhibits swaying when eyes are closed, indicating possible vestibular dysfunction. Discussed potential benign vestibular nerve tumors causing nerve pressure. - Order MRI IAC with focus on the internal auditory canal to rule out CPA lesions - Refer for vestibular testing - Consider vestibular therapy based on test results - Discuss use of a cane for safety during ambulation  Neuropathy Neuropathy  evaluated by a neurologist already, sent for balance PT and recommended to have supportive care - Continue current management as per neurologist's recommendations  Recurrent cerumen impaction, had impaction on the right side today  - s/p cerumen removal  - use Debrox or sweet oil to prevent it in the future   General Health Maintenance Discussion on earwax management and general ear health. - Advise against using Q-tips inside the ear - Recommend Debrox for earwax management  Follow-up - Schedule MRI IAC and vestibular testing - Follow-up appointment after test results are available.        Thank you  for allowing me to participate in the care of this patient. Please do not hesitate to contact me with any questions or concerns.   Ashok Croon, MD Otolaryngology [facility] Phone: [phone] Fax: [phone]    12/16/2022, 3:54 PM

## 2022-12-19 ENCOUNTER — Institutional Professional Consult (permissible substitution) (INDEPENDENT_AMBULATORY_CARE_PROVIDER_SITE_OTHER): Payer: Medicare HMO | Admitting: Audiology

## 2022-12-23 ENCOUNTER — Ambulatory Visit (HOSPITAL_COMMUNITY)
Admission: RE | Admit: 2022-12-23 | Discharge: 2022-12-23 | Disposition: A | Discharge status: Home / Self Care | Payer: Medicare HMO | Source: Ambulatory Visit | Attending: Otolaryngology | Admitting: Otolaryngology

## 2022-12-23 DIAGNOSIS — R2689 Other abnormalities of gait and mobility: Secondary | ICD-10-CM | POA: Insufficient documentation

## 2022-12-23 DIAGNOSIS — I6782 Cerebral ischemia: Secondary | ICD-10-CM | POA: Diagnosis not present

## 2022-12-23 MED ORDER — GADOBUTROL 1 MMOL/ML IV SOLN
6.0000 mL | Freq: Once | INTRAVENOUS | Status: AC | PRN
  Administered 2022-12-23: 6 mL via INTRAVENOUS

## 2022-12-24 ENCOUNTER — Other Ambulatory Visit: Payer: Self-pay | Admitting: Nurse Practitioner

## 2022-12-24 DIAGNOSIS — E063 Autoimmune thyroiditis: Secondary | ICD-10-CM

## 2023-01-11 ENCOUNTER — Ambulatory Visit (INDEPENDENT_AMBULATORY_CARE_PROVIDER_SITE_OTHER): Payer: Medicare HMO | Admitting: Audiology

## 2023-01-11 DIAGNOSIS — R42 Dizziness and giddiness: Secondary | ICD-10-CM | POA: Diagnosis not present

## 2023-01-11 DIAGNOSIS — R94121 Abnormal vestibular function study: Secondary | ICD-10-CM

## 2023-01-11 NOTE — Progress Notes (Unsigned)
[address], [address] [place], [place] [address] [phone]  Vestibular Evaluation    Name: Nancy Williams     DOB:   [birth year]      MRN:   [id number]                                                                                     Service Date: 01/11/2023     Accompanied by: unaccompanied (daughter stayed in the waiting room)    Patient comes today after Dr. Irene Pap, ENT sent a referral for a vestibular evaluation due to concerns with balance.   Case History:  Patient reported a sensation of being off balance and a left sway when walking that started approximately in 2020. The patient cannot recall if this started before or after she was infected with COVID 19 in 2020.  Patient noted that nothing unusual happens when they roll over in bed. Her family sometimes describes her balance as "drunk like walking".  Patient did not report any other associated symptoms. Patient reports that it happens all the time while she is on her feet walking. Nothing happens if she is sitting. Exacerbating factors include feeling tired at the end of the day.  Per previous medical records notes, she has reported that it seems that with time it keeps getting worse. Today she reports that it seems to be getting better.  Currently, she does not take any medication for the imbalance. She received physical therapy for several months. Today the patient denied taking any relevant medications within the past 48 hours. Additional medical history pertinent to the vestibular assessment includes Hashimoto's disease, age related macular degeneration, neuropathy, irregular heartbeat, faint cataracts, arthritis, osteoporosis.   Patient had a test completed in 2019 at [facility] which demonstrated normal hearing in both ears, except for a mild presumably sensorineural hearing loss at 8000 Hz.  Biomedical scientist (SOP) Test: Romberg (Conditions 1-2): near fall Tandem Romberg (Conditions  3-4): near fall CTSIB (Conditions 5-6): near fall Fukuda (Condition 7): near fall    Vetebral Artery Screening Test (VAST): VAST Right: Negative VAST Left: Negative    Electrophysiology: Auditory Brainstem Response (ABR): WNL AU Electrocochleography (ECochG): Inconclusive in both ears. Cervical Vestibular-Evoked Myogenic Potential (cVEMP): Absent in both ears.   Videonystagmography (VNG): Ocular Motility Tests Gaze: WNL Smooth Pursuit: WNL Saccades: WNL OPK: WNL  Spontaneous Nystagmus Sitting: 1 degree/second right beating Supine: None Head Shake (Active): Subclinical nystagmus -left beating 3 degrees/second   Positional Tests Head Right: WNL Head Left: WNL Body Right: DNT Body Left: DNT  Positioning Tests Head Hanging Right: WNL Head Hanging Left: WNL Dix-Hallpike/Side Lying Right: Negative for BPPV Dix-Hallpike/Side Lying Left: Negative for BPPV  Bithermal Air Calorics Unilateral Weakness: 17% (<25% WNL), Right Directional Preponderance: 8% (<30% WNL), Left Bilateral Weakness: 24 deg/s (>24 deg/s WNL) Fixation Index: Normal  Rotary Chair: Sinusoidal Harmonic Acceleration (SHA) Frequencies Tested: 0.01, 0.02, 0.04, 0.08, 0.16, 0.32, 0.64 Hz Gain: Low gain across all frequencies. Phase: Normal at .04 and .64 Hz, incalculable at all other frequencies. Symmetry: Normal at .04 and .64 Hz, incalculable at all other frequencies  Step Velocity Test (SVT) Gain: Low  gain for rightward and leftward rotations. Time Constant: WNL Time Constant Asymmetry: WNL     Conclusions:    Abnormal vestibular assessment with central indicators.  1. Abnormal Gans SOP Test results suggest an imbalance between visual, somatosensory, and vestibular inputs for dynamic postural control.  2. Vertebral Artery Screening Test was negative, bilaterally, for vertebral artery insufficiency. 3. Normal ABR results in both ears suggest good function of the cochlear nerve. 4. Inconclusive  ECochG results in both ears may be attributed to poor morphology and wave replicability.  Elevated labyrinthine pressure, or endolymphatic hydrops, cannot be ruled out in either ear at this time.    5.Abnormal cVEMP results in both ears may be attributed to poor function of the saccule and/or inferior vestibular nerve, a normal age-related variant associated with low muscle tone at the recording site, and/or history of neck issues.   6. Normal ocular motility test results suggest normal VOR reflex. No clinically significant nystagmus or BPPV observed during positional and positioning tests. Normal head shake test results do not suggest an imbalance of vestibular inputs. Normal bithermal air calorics results do not suggest a unilateral weakness or bilateral weakness affecting the lateral semicircular canal and/or superior vestibular nerve in either ear.  7. Abnormal low gain for [facility] suggests central involvement.  Abnormal low gain for rightward and leftward SVT suggests an impaired velocity storage mechanism and central involvement.      Recommendations: 1.Follow-up appointment with ENT as scheduled at [facility] for further discussion and recommendations.   Audiologist: Merrily Pew, AUD Date of Service: 01-11-2023

## 2023-02-03 DIAGNOSIS — H2513 Age-related nuclear cataract, bilateral: Secondary | ICD-10-CM | POA: Diagnosis not present

## 2023-02-03 DIAGNOSIS — H43813 Vitreous degeneration, bilateral: Secondary | ICD-10-CM | POA: Diagnosis not present

## 2023-02-03 DIAGNOSIS — H353134 Nonexudative age-related macular degeneration, bilateral, advanced atrophic with subfoveal involvement: Secondary | ICD-10-CM | POA: Diagnosis not present

## 2023-02-03 DIAGNOSIS — H35422 Microcystoid degeneration of retina, left eye: Secondary | ICD-10-CM | POA: Diagnosis not present

## 2023-03-02 ENCOUNTER — Ambulatory Visit (INDEPENDENT_AMBULATORY_CARE_PROVIDER_SITE_OTHER): Payer: Medicare HMO | Admitting: Family Medicine

## 2023-03-02 ENCOUNTER — Telehealth: Payer: Self-pay | Admitting: Neurology

## 2023-03-02 VITALS — BP 108/68 | HR 63 | Temp 98.1°F | Ht 60.0 in | Wt 135.0 lb

## 2023-03-02 DIAGNOSIS — Z130 Encounter for screening for diseases of the blood and blood-forming organs and certain disorders involving the immune mechanism: Secondary | ICD-10-CM

## 2023-03-02 DIAGNOSIS — R269 Unspecified abnormalities of gait and mobility: Secondary | ICD-10-CM | POA: Diagnosis not present

## 2023-03-02 DIAGNOSIS — E032 Hypothyroidism due to medicaments and other exogenous substances: Secondary | ICD-10-CM | POA: Diagnosis not present

## 2023-03-02 DIAGNOSIS — E785 Hyperlipidemia, unspecified: Secondary | ICD-10-CM

## 2023-03-02 DIAGNOSIS — F419 Anxiety disorder, unspecified: Secondary | ICD-10-CM | POA: Diagnosis not present

## 2023-03-02 NOTE — Telephone Encounter (Signed)
Please order DAT scan on this patient.  Her PCP contacted me that her balance is getting worse.  Let pt know that we'll be ordering this test to look for neurodegenerative condition such as parkinson's disease.  I'll see her in follow-up once we have the results back.

## 2023-03-02 NOTE — Patient Instructions (Signed)
Labs today.  I will reach out with results.  Follow up in 1 month.

## 2023-03-04 DIAGNOSIS — F419 Anxiety disorder, unspecified: Secondary | ICD-10-CM | POA: Insufficient documentation

## 2023-03-04 DIAGNOSIS — R269 Unspecified abnormalities of gait and mobility: Secondary | ICD-10-CM | POA: Insufficient documentation

## 2023-03-04 DIAGNOSIS — E785 Hyperlipidemia, unspecified: Secondary | ICD-10-CM | POA: Insufficient documentation

## 2023-03-04 MED ORDER — SERTRALINE HCL 25 MG PO TABS: 25.0000 mg | ORAL_TABLET | Freq: Every day | ORAL | 1 refills | Status: DC

## 2023-03-04 NOTE — Assessment & Plan Note (Signed)
Etiology unclear.  Unclear prognosis at this time.  Notes, imaging, and labs reviewed.  This appears to be central in nature.  Spoke with cardiology and also spoke with neurology, Dr. Allena Katz.  Dr. Allena Katz will arrange DAT scan.  Obtaining labs today for further evaluation.

## 2023-03-04 NOTE — Assessment & Plan Note (Signed)
Patient reports an inner restlessness.  Could be akathisia.  Will trial SSRI.

## 2023-03-04 NOTE — Progress Notes (Signed)
Subjective:  Patient ID: Nancy Williams, female    DOB: [birth year]  Age: 83 y.o. MRN: [id number]  CC:   Chief Complaint  Patient presents with   Establish Care    Has been dealing with problems with dizziness, weakness and balance for years has been going to many specialists with no one being able to make much difference except condition improves with better calorie intake and excercises    HPI:  83 year old female with hypothyroidism and hyperlipidemia presents to establish care.  Patient accompanied by her daughter today.  Patient and daughter endorse a 3-year history of difficulty with gait and balance.  Patient denies any dizziness.  She states that she just feels off balance.  Patient also reports that she feels jittery in the morning which lasts into the afternoon.  Patient has seen multiple specialist regarding her balance issues including neurology, audiology, ENT, functional medicine.  She is also had physical therapy.  She reports that she has had a vestibular assessment, most recently by audiology.  I have reviewed the notes available to the EMR.  Most recent visit from audiology reflects that this is central in origin.  She had negative MRI imaging.  She has some findings of peripheral neuropathy but none to the extent that would account for her balance issues per neurology.  She is very healthy and active otherwise.  This particular issue has interfere with her normal activities.  She is no longer dancing as she used to.  Patient Active Problem List   Diagnosis Date Noted   Hyperlipidemia 03/04/2023   Gait abnormality 03/04/2023   Anxiety 03/04/2023   Hypothyroidism     Social Hx   Social History   Socioeconomic History   Marital status: Widowed    Spouse name: Not on file   Number of children: Not on file   Years of education: Not on file   Highest education level: 12th grade  Occupational History   Not on file  Tobacco Use   Smoking status: Never   Smokeless  tobacco: Never  Vaping Use   Vaping status: Never Used  Substance and Sexual Activity   Alcohol use: Yes    Comment: Occasional, last drink 1-2 years ago as of 2021    Drug use: Never   Sexual activity: Not on file  Other Topics Concern   Not on file  Social History Narrative   Per [facility] New Patient Packet Abstracted on 08/19/2019:      Diet: Vegetarian, gluten free       Caffeine: Coffee & Tea      Married, if yes what year: Widow, 1959      Do you live in a house, apartment, assisted living, condo, trailer, ect: House      Is it one or more stories: One stories, one person       Pets: 4 cats       Current/Past profession: Left Blank      Highest level or education completed: 12 th grade       Exercise:        Try          Type and how often: Need to clarify, unable to read what patient wrote          Living Will: Yes   DNR: Yes   POA/HPOA: Yes      Functional Status:   Do you have difficulty bathing or dressing yourself? No   Do you have difficulty preparing food  or eating?No   Do you have difficulty managing your medications? No   Do you have difficulty managing your finances? No   Do you have difficulty affording your medications? No         Right Handed    Lives in a one story home    Social Drivers of Health   Financial Resource Strain: Low Risk  (02/24/2023)   Overall Financial Resource Strain (CARDIA)    Difficulty of Paying Living Expenses: Not hard at all  Food Insecurity: No Food Insecurity (02/24/2023)   Hunger Vital Sign    Worried About Running Out of Food in the Last Year: Never true    Ran Out of Food in the Last Year: Never true  Transportation Needs: No Transportation Needs (02/24/2023)   PRAPARE - Administrator, Civil Service (Medical): No    Lack of Transportation (Non-Medical): No  Physical Activity: Sufficiently Active (10/06/2022)   Exercise Vital Sign    Days of Exercise per Week: 6 days    Minutes of Exercise per Session: 30  min  Stress: No Stress Concern Present (02/24/2023)   [employer] of Occupational Health - Occupational Stress Questionnaire    Feeling of Stress : Only a little  Social Connections: Moderately Isolated (02/24/2023)   Social Connection and Isolation Panel [NHANES]    Frequency of Communication with Friends and Family: More than three times a week    Frequency of Social Gatherings with Friends and Family: Once a week    Attends Religious Services: Never    Database administrator or Organizations: Yes    Attends Engineer, structural: More than 4 times per year    Marital Status: Widowed    Review of Systems Per HPI  Objective:  BP 108/68   Pulse 63   Temp 98.1 F (36.7 C)   Ht 5' (1.524 m)   Wt 135 lb (61.2 kg)   SpO2 97%   BMI 26.37 kg/m      03/02/2023    2:10 PM 12/16/2022    9:46 AM 10/31/2022    2:04 PM  BP/Weight  Systolic BP 108 149 128  Diastolic BP 68 73 71  Wt. (Lbs) 135 132 133  BMI 26.37 kg/m2 25.78 kg/m2 21.47 kg/m2    Physical Exam Vitals and nursing note reviewed.  Constitutional:      General: She is not in acute distress.    Appearance: Normal appearance.  HENT:     Head: Normocephalic and atraumatic.  Eyes:     General:        Right eye: No discharge.        Left eye: No discharge.     Conjunctiva/sclera: Conjunctivae normal.  Cardiovascular:     Rate and Rhythm: Normal rate and regular rhythm.  Pulmonary:     Effort: Pulmonary effort is normal.     Breath sounds: Normal breath sounds. No wheezing, rhonchi or rales.  Neurological:     Mental Status: She is alert.     Comments: Patient with significant gait abnormality.  She appears off balance and often leans to the left side, and has to hold onto the wall or something else to maintain her balance.  Very stiff with ambulation.   Psychiatric:        Mood and Affect: Mood normal.        Behavior: Behavior normal.     Lab Results  Component Value Date   WBC 8.4 03/02/2023  HGB 14.4 03/02/2023   HCT 43.5 03/02/2023   PLT 293 03/02/2023   GLUCOSE 77 03/02/2023   CHOL 243 (H) 03/02/2023   TRIG 142 03/02/2023   HDL 94 03/02/2023   LDLCALC 125 (H) 03/02/2023   ALT 15 03/02/2023   AST 25 03/02/2023   NA 142 03/02/2023   K 4.9 03/02/2023   CL 101 03/02/2023   CREATININE 0.77 03/02/2023   BUN 22 03/02/2023   CO2 23 03/02/2023   TSH 4.480 03/02/2023   HGBA1C 5.5 10/27/2021     Assessment & Plan:   Problem List Items Addressed This Visit       Endocrine   Hypothyroidism   Relevant Orders   TSH (Completed)     Other   RESOLVED: Hyperlipemia   Relevant Orders   Lipid panel (Completed)   Gait abnormality - Primary   Etiology unclear.  Unclear prognosis at this time.  Notes, imaging, and labs reviewed.  This appears to be central in nature.  Spoke with cardiology and also spoke with neurology, Dr. Allena Katz.  Dr. Allena Katz will arrange DAT scan.  Obtaining labs today for further evaluation.      Relevant Orders   CMP14+EGFR (Completed)   Vitamin B12 (Completed)   Heavy Metals, Blood (Completed)   Anxiety   Patient reports an inner restlessness.  Could be akathisia.  Will trial SSRI.      Relevant Medications   sertraline (ZOLOFT) 25 MG tablet   Other Visit Diagnoses       Screening for deficiency anemia       Relevant Orders   CBC (Completed)       Meds ordered this encounter  Medications   sertraline (ZOLOFT) 25 MG tablet    Sig: Take 1 tablet (25 mg total) by mouth daily.    Dispense:  90 tablet    Refill:  1    Follow-up:  Return in about 1 month (around 03/31/2023).  Everlene Other DO [facility]

## 2023-03-05 ENCOUNTER — Encounter: Payer: Self-pay | Admitting: Family Medicine

## 2023-03-05 LAB — CBC
Hematocrit: 43.5 % (ref 34.0–46.6)
Hemoglobin: 14.4 g/dL (ref 11.1–15.9)
MCH: 30.3 pg (ref 26.6–33.0)
MCHC: 33.1 g/dL (ref 31.5–35.7)
MCV: 92 fL (ref 79–97)
Platelets: 293 10*3/uL (ref 150–450)
RBC: 4.75 x10E6/uL (ref 3.77–5.28)
RDW: 12.9 % (ref 11.7–15.4)
WBC: 8.4 10*3/uL (ref 3.4–10.8)

## 2023-03-05 LAB — LIPID PANEL
Chol/HDL Ratio: 2.6 {ratio} (ref 0.0–4.4)
Cholesterol, Total: 243 mg/dL — ABNORMAL HIGH (ref 100–199)
HDL: 94 mg/dL (ref >39)
LDL Chol Calc (NIH): 125 mg/dL — ABNORMAL HIGH (ref 0–99)
Triglycerides: 142 mg/dL (ref 0–149)
VLDL Cholesterol Cal: 24 mg/dL (ref 5–40)

## 2023-03-05 LAB — CMP14+EGFR
ALT: 15 [IU]/L (ref 0–32)
AST: 25 [IU]/L (ref 0–40)
Albumin: 4.7 g/dL (ref 3.7–4.7)
Alkaline Phosphatase: 92 [IU]/L (ref 44–121)
BUN/Creatinine Ratio: 29 — ABNORMAL HIGH (ref 12–28)
BUN: 22 mg/dL (ref 8–27)
Bilirubin Total: <0.2 mg/dL (ref 0.0–1.2)
CO2: 23 mmol/L (ref 20–29)
Calcium: 10.2 mg/dL (ref 8.7–10.3)
Chloride: 101 mmol/L (ref 96–106)
Creatinine, Ser: 0.77 mg/dL (ref 0.57–1.00)
Globulin, Total: 2.6 g/dL (ref 1.5–4.5)
Glucose: 77 mg/dL (ref 70–99)
Potassium: 4.9 mmol/L (ref 3.5–5.2)
Sodium: 142 mmol/L (ref 134–144)
Total Protein: 7.3 g/dL (ref 6.0–8.5)
eGFR: 76 mL/min/{1.73_m2} (ref >59)

## 2023-03-05 LAB — TSH: TSH: 4.48 u[IU]/mL (ref 0.450–4.500)

## 2023-03-05 LAB — HEAVY METALS, BLOOD
Arsenic: 2 ug/L (ref 0–9)
Lead, Blood: 1.1 ug/dL (ref 0.0–3.4)

## 2023-03-05 LAB — VITAMIN B12: Vitamin B-12: 1522 pg/mL — ABNORMAL HIGH (ref 232–1245)

## 2023-03-06 ENCOUNTER — Other Ambulatory Visit: Payer: Self-pay

## 2023-03-06 DIAGNOSIS — R251 Tremor, unspecified: Secondary | ICD-10-CM

## 2023-03-06 NOTE — Telephone Encounter (Signed)
Dat Scan has been ordered and submitted. Pending response on if PA is needed.

## 2023-03-10 ENCOUNTER — Encounter (INDEPENDENT_AMBULATORY_CARE_PROVIDER_SITE_OTHER): Payer: Self-pay | Admitting: Otolaryngology

## 2023-03-10 ENCOUNTER — Ambulatory Visit (INDEPENDENT_AMBULATORY_CARE_PROVIDER_SITE_OTHER): Payer: Medicare HMO | Admitting: Otolaryngology

## 2023-03-10 VITALS — BP 143/72 | HR 84

## 2023-03-10 DIAGNOSIS — R42 Dizziness and giddiness: Secondary | ICD-10-CM

## 2023-03-10 DIAGNOSIS — R2681 Unsteadiness on feet: Secondary | ICD-10-CM

## 2023-03-10 DIAGNOSIS — G629 Polyneuropathy, unspecified: Secondary | ICD-10-CM

## 2023-03-10 DIAGNOSIS — R2689 Other abnormalities of gait and mobility: Secondary | ICD-10-CM | POA: Diagnosis not present

## 2023-03-10 NOTE — Progress Notes (Signed)
 ENT Progress Note:  Update 03/10/23  Discussed the use of AI scribe software for clinical note transcription with the patient, who gave verbal consent to proceed.  History of Present Illness   Nancy Williams is an 83 year old female who presents with balance problems and unsteadiness.  She has been experiencing persistent balance problems and unsteadiness, which are significantly affecting her daily activities and making it difficult for her to walk without support.  A vestibular testing were conducted, revealing intact visual reflexes and normal cochlear function. Benign positional vertigo was ruled out, though some tests were inconclusive, indicating a potential age-related variant or weakness of the vestibular nerve.  An MRI brain IAC did not reveal any lesions at the CPA or other acute findings, showing no evidence of stroke. Here to discuss results. No new sx since last office visit.     Records reviewed: Initial Evaluation 12/16/23 Reason for Consult: balance issues x 3 yrs    HPI: Discussed the use of AI scribe software for clinical note transcription with the patient, who gave verbal consent to proceed.  History of Present Illness   The patient is a pleasant 57 yoF, with a history of macular degeneration and neuropathy, presents with longstanding balance issues.   She describes a sensation of 'floating' and veering to the left when walking, often resulting in collisions with walls. The patient denies any associated dizziness or vertigo. Despite these balance issues, the patient maintains an active lifestyle, walking a mile daily and performing core-strengthening exercises.  The patient has sought multiple opinions for this issue, including neurology, cardiology, and nutrition, with no definitive diagnosis. She has undergone physical therapy, which did not yield significant improvement. An MRI brain and CT scan of head/cervical spine/max/face were performed, with no abnormal findings  (CT scans were done 02/12/22 after a fall). The patient also underwent a detox for suspected heavy metal toxicity, which was discontinued due to adverse effects (this was recommended by Naturopath/Functional Medicine specialist).  The patient's balance issues are constant and have led to at least one fall. Despite the persistent balance issues, the patient is reluctant to use assistive devices such as a cane. The patient's daughter, who often accompanies her, describes the patient's gait as 'wobbly' and akin to appearing 'drunk.'  The patient also has a history of arthritis in the shoulder pain, which was initially suspected to contribute to the balance issues. However, this was ruled out per report. The patient also reports a history of neuropathy, which has been evaluated by a neurologist and deemed unrelated to the current balance issues.  The patient's balance issues remain a significant concern, impacting her daily activities and quality of life. Despite extensive evaluation and intervention, the cause remains elusive, and the symptoms persist.     Records Reviewed:  Neurology note 10/31/22 IMPRESSION/PLAN: Unsteady gait, unclear etiology.  She has mild neuropathy distally in the feet, which would not cause this degree of unsteadiness.  MRI lumbar spine was reviewed and shows mild degenerative changes, none which would explain her imbalance.  MRI brain was personally viewed and looks great, no evidence of NPH or cerebellar atrophy. Exam does not show any neurodegenerative condition, such as parkinson's.   I explained that I do not have a good answer for her symptoms.  Management may be supportive, such that I emphasized fall prevention and balance training.  She is interested in PT for balance.  I also recommend adding grab bars in the shower and that she use a  rollator for stability.    --------------------------------------------- History of present illness: She was diagnosed with neuropathy 18  years ago manifesting with burning pain.  She was recommended to wear good shoes which alleviated her burning pain.  She now has numbness in the soles of the feet, but no tingling or pain.   Over the past 6 months, she has noticed greater problems with imbalance which has progressively been getting worse.  She walks unassisted, but tends to lean on things for support.  She has not suffered any falls.  She lives alone in a one-level home.  She retired from office work.  Her drives, manages medication and finances, and does her own household chores.    Her daughter also says that she has spells where she appears with a blank stare.  Her memory is not as great and for the past 2 years.  Patient feels that she is not right, but unable to find a reason for this.  They would like MRI brain.    UPDATE 10/31/2022:  She complains unsteadiness for the past 3 years.  There is no dizziness or spinning. She had one fall climbing stairs down, but reports that she was holding items in her hand.  She walks unassisted but always feels like she is swaying towards the left.  Symptoms are constant.  She does not use gait assist device.  She does endorse holding on to walls/furniture.  Despite this, she walks 1 mile daily. She denies leg weakness, neck pain, back pain, or joint pain.  She has mild numbness in the soles of the feet.   Cardiology office note 03/14/22 Nancy Williams is a 83 y.o. female  with past medical history of hypothyroidism, osteoarthritis and palpitations (s/p monitor in 05/2019 showing brief SVT and 4% PVC burden).   Patient last saw Ms Johnson 01/2021 and having atypical chest pain and progressive fatigue. Because of strong gamily history of CAD a Coronary CTA was ordered. Also having more palpitations so Toprol  XL increased 25 mg daily. Calcium score 21 and mild nonobstructive CAD.   She called in 01/2021 having low BP's and stopped Toprol  and started Magnesium for palpitations. She was in ED with fall  and head laceration 02/12/22-tripped while carrying a box down steps. When she gets up in the am she gets a jittery feeling, weak and tired. Improves as the day goes on. Balance has been off. She asked her PCP to lower her synthroid . She's been to functional doctors, [facility], neurology and many physicians. She used to dance but can't do anything. Walks 1/4 mile 2-3 days/week. Was taking gabapentin  prn but now taking twice daily and seems to be helping.   Palpitations/SVT-Toprol  stopped by patient b/c of low BP's-doesn't think it ever helped. Still has jittery sensation in her chest daily when she wakes up. Makes her feel poorly the rest of the day. Has been to multiple physicians and takes multiple supplements. Will place 7 day Zio. Encourage her to see endocrinologist.   CAD mild nonobstructive   Hypothyroidism taking synthroid  and I thyroid  from function medicine. For blood work later this month. Encourage her to see endocrinologist.    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    Hypothyroidism    Hashimoto's per [facility] New Patient Packet    Neuropathy    Osteoarthritis    Per patient at New patient appointment  Seasonal allergies    Vitamin D deficiency     Past Surgical History:  Procedure Laterality Date   No previous surgery      Family History  Problem Relation Age of Onset   Heart disease Mother    Heart failure Mother    Heart disease Father    Prostate cancer Father    Heart disease Son    Diabetes Son    Prostate cancer Son    Thyroid  disease Son    Hashimoto's thyroiditis Daughter     Social History:  reports that she has never smoked. She has never used smokeless tobacco. She reports current alcohol use. She reports that she does not use drugs.  Allergies: No Known Allergies  Medications: I have reviewed the patient's current  medications.  The PMH, PSH, Medications, Allergies, and SH were reviewed and updated.  ROS: Constitutional: Negative for fever, weight loss and weight gain. Cardiovascular: Negative for chest pain and dyspnea on exertion. Respiratory: Is not experiencing shortness of breath at rest. Gastrointestinal: Negative for nausea and vomiting. Neurological: Negative for headaches. Psychiatric: The patient is not nervous/anxious  Blood pressure (!) 143/72, pulse 84, SpO2 96%.  PHYSICAL EXAM:  Exam: General: Well-developed, well-nourished Respiratory Respiratory effort: Equal inspiration and expiration without stridor Cardiovascular Peripheral Vascular: Warm extremities with equal color/perfusion Eyes: No nystagmus with equal extraocular motion bilaterally Neuro/Psych/Balance: Patient oriented to person, place, and time; Appropriate mood and affect; Cranial nerves I-XII are intact. Head and Face Inspection: Normocephalic and atraumatic without mass or lesion Palpation: Facial skeleton intact without bony stepoffs Salivary Glands: No mass or tenderness Facial Strength: Facial motility symmetric and full bilaterally ENT Pinna: External ear intact and fully developed External canal: Canal is patent with intact skin, had cerumen removed on the right side  Tympanic Membrane: Clear and mobile External Nose: No scar or anatomic deformity Internal Nose: Septum intact and midline. No edema, polyp, or rhinorrhea Lips, Teeth, and gums: Mucosa and teeth intact and viable Oral cavity/oropharynx: No erythema or exudate, no lesions present Neck Neck and Trachea: Midline trachea without mass or lesion Thyroid : No mass or nodularity Lymphatics: No lymphadenopathy  Procedure: None  Studies Reviewed: NCS/EMG of the left hand 09/02/2020: Left median neuropathy at or distal to the wrist, consistent with a clinical diagnosis of carpal tunnel syndrome.  Overall, these findings are moderate-to-severe in  degree electrically.   MRI brain wo contrast 12/04/2020: Age normal brain MRI.  No explanation for symptoms.    MRI lumbar spine wo contrast 12/04/2020: 1. Multilevel lumbar spondylosis as described above, slightly progressed at L4-L5. No high-grade spinal canal or neuroforaminal stenosis at any level.  Vestibular testing results 01/11/23 1. Abnormal [facility] Test results suggest an imbalance between visual, somatosensory, and vestibular inputs for dynamic postural control.  2. Vertebral Artery Screening Test was negative, bilaterally, for vertebral artery insufficiency. 3. Normal ABR results in both ears suggest good function of the cochlear nerve. 4. Inconclusive ECochG results in both ears may be attributed to poor morphology and wave replicability.  Elevated labyrinthine pressure, or endolymphatic hydrops, cannot be ruled out in either ear at this time.    5.Abnormal cVEMP results in both ears may be attributed to poor function of the saccule and/or inferior vestibular nerve, a normal age-related variant associated with low muscle tone at the recording site, and/or history of neck issues.   6. Normal ocular motility test results suggest normal VOR reflex. No clinically significant nystagmus or BPPV observed during positional and  positioning tests. Normal head shake test results do not suggest an imbalance of vestibular inputs. Normal bithermal air calorics results do not suggest a unilateral weakness or bilateral weakness affecting the lateral semicircular canal and/or superior vestibular nerve in either ear.  7. Abnormal low gain for [facility] suggests central involvement.  Abnormal low gain for rightward and leftward SVT suggests an impaired velocity storage mechanism and central involvement.  MR brain/IAC w/w/0 con 01/10/23 IMPRESSION: 1. No acute intracranial abnormality or mass. 2. Minimal chronic small vessel ischemic disease.  Assessment/Plan: Encounter Diagnoses  Name Primary?    Dizziness and giddiness Yes   Balance disorder    Neuropathy    Gait instability      Assessment and Plan    Chronic balance Issues Chronic balance issues characterized by a feeling of floating, veering to the left while walking, and occasional falls/gait instability. No dizziness or vertigo. Previous evaluations include normal brain MRI (2022) c-spine MRI, CT scans following a fall (including brain, max/face and c-spine 01/2022), and cardiology workup. Physical therapy for balance issues attempted without significant improvement. Differential diagnosis includes vestibular hypofunction, neuropathy, and potential vestibular nerve tumors, although latter is less likely 2/2 no hearing changes and other findings. She had (+) Romberg on exam today which is c/w vestibular weakness vs neuropathy. Exhibits swaying when eyes are closed, indicating possible vestibular dysfunction. Discussed potential benign vestibular nerve tumors causing nerve pressure. - Order MRI IAC with focus on the internal auditory canal to rule out CPA lesions - Refer for vestibular testing - Consider vestibular therapy based on test results - Discuss use of a cane for safety during ambulation  Neuropathy Neuropathy evaluated by a neurologist already, sent for balance PT and recommended to have supportive care - Continue current management as per neurologist's recommendations  Recurrent cerumen impaction, had impaction on the right side today  - s/p cerumen removal  - use Debrox or sweet oil to prevent it in the future   General Health Maintenance Discussion on earwax management and general ear health. - Advise against using Q-tips inside the ear - Recommend Debrox for earwax management  Follow-up - Schedule MRI IAC and vestibular testing - Follow-up appointment after test results are available.      Update 03/10/23 Assessment and Plan    Imbalance long-standing  Vestibular testing showed no benign positional  vertigo, and visual reflexes were normal. No clear evidence to suggest isolated vestibular weakness as a cause. Rest of the testing is either inconclusive or suggestive of central process/age-related changes. MRI brain IAC was negative. Cochlear function was intact on ABR. SABRA Vestibular rehabilitation recommended.She is being f/b Neurology, and DAT scan is planned to rule out neurodegenerative d/o as a cause.  - Refer to vestibular rehabilitation for exercises  - Send referral to a nearby facility, preferably through [facility] - see Neurology as scheduled  - Follow up as needed.          Elena Larry, MD Otolaryngology [facility] Phone: [phone] Fax: [phone]    03/10/2023, 5:36 PM

## 2023-03-16 ENCOUNTER — Encounter (HOSPITAL_COMMUNITY)
Admission: RE | Admit: 2023-03-16 | Discharge: 2023-03-16 | Disposition: A | Discharge status: Home / Self Care | Payer: Medicare HMO | Source: Ambulatory Visit | Attending: Neurology | Admitting: Neurology

## 2023-03-16 DIAGNOSIS — R251 Tremor, unspecified: Secondary | ICD-10-CM | POA: Diagnosis present

## 2023-03-16 MED ORDER — IOFLUPANE I 123 185 MBQ/2.5ML IV SOLN
4.9000 | Freq: Once | INTRAVENOUS | Status: AC | PRN
Start: 2023-03-16 — End: 2023-03-16
  Administered 2023-03-16: 4.9 via INTRAVENOUS
  Filled 2023-03-16: qty 5

## 2023-03-16 MED ORDER — POTASSIUM IODIDE (ANTIDOTE) 130 MG PO TABS
130.0000 mg | ORAL_TABLET | Freq: Once | ORAL | Status: AC
  Administered 2023-03-16: 130 mg via ORAL

## 2023-03-16 MED ORDER — POTASSIUM IODIDE (ANTIDOTE) 130 MG PO TABS
ORAL_TABLET | ORAL | Status: AC
  Filled 2023-03-16: qty 1

## 2023-03-17 DIAGNOSIS — R2681 Unsteadiness on feet: Secondary | ICD-10-CM | POA: Diagnosis not present

## 2023-03-28 ENCOUNTER — Ambulatory Visit (HOSPITAL_COMMUNITY): Payer: Medicare HMO | Attending: Otolaryngology

## 2023-03-28 ENCOUNTER — Other Ambulatory Visit: Payer: Self-pay

## 2023-03-28 DIAGNOSIS — R2689 Other abnormalities of gait and mobility: Secondary | ICD-10-CM | POA: Insufficient documentation

## 2023-03-28 DIAGNOSIS — R262 Difficulty in walking, not elsewhere classified: Secondary | ICD-10-CM | POA: Diagnosis present

## 2023-03-28 NOTE — Therapy (Signed)
 OUTPATIENT PHYSICAL THERAPY VESTIBULAR EVALUATION     Patient Name: Nancy Williams MRN: [id number] DOB:1940/07/26, 83 y.o., female Today's Date: 03/28/2023  END OF SESSION:  PT End of Session - 03/28/23 1621     Visit Number 1    Number of Visits 8    Date for PT Re-Evaluation 04/25/23    Authorization Type [employer] HMO/PPO (no auth)    PT Start Time 1515    PT Stop Time 1600    PT Time Calculation (min) 45 min    Equipment Utilized During Treatment Gait belt    Activity Tolerance Patient tolerated treatment well    Behavior During Therapy WFL for tasks assessed/performed             Past Medical History:  Diagnosis Date   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    Hypothyroidism    Hashimoto's per [facility] New Patient Packet    Neuropathy    Osteoarthritis    Per patient at New patient appointment    Seasonal allergies    Vitamin D deficiency    Past Surgical History:  Procedure Laterality Date   No previous surgery     Patient Active Problem List   Diagnosis Date Noted   Hyperlipidemia 03/04/2023   Gait abnormality 03/04/2023   Anxiety 03/04/2023   Hypothyroidism     PCP: Tommie Sams, DO REFERRING PROVIDER: Ashok Croon, MD  REFERRING DIAG: R42 (ICD-10-CM) - Dizziness and giddiness R26.89 (ICD-10-CM) - Balance disorder G62.9 (ICD-10-CM) - Neuropathy R26.81 (ICD-10-CM) - Gait instability  THERAPY DIAG:  Difficulty in walking, not elsewhere classified  Balance problem  ONSET DATE: 3 years ago  Rationale for Evaluation and Treatment: Rehabilitation  SUBJECTIVE:   SUBJECTIVE STATEMENT: EVAL: Arrives to the clinic with issues for her being imbalance which is more evident when patient stands and walks. Denies dizziness. Denies any sensation that the room is spinning. Denies any numbness/weakness on the legs. Patient has had the imbalance issues for 3 years without apparent reason and gradually got worse in time. Patient  reports that her MD cannot figure out what's causing her imbalance. Per patient, ENT reports that the nerve on her ear could be weak. Had PT for the same issue in Fall of 2024 and is unsure if PT has helped her. Claims that she has not been diligent with her HEP after being d/c from PT. Patient reports that her balance issues have gotten worse since she was d/c. Denies any recent falls. Recently, patient was referred to outpatient PT evaluation and management. Pt accompanied by: self  PERTINENT HISTORY: Anxiety, hypothyroidism  PAIN:  Are you having pain? No  PRECAUTIONS: Fall  RED FLAGS: None   WEIGHT BEARING RESTRICTIONS: No  FALLS: Has patient fallen in last 6 months? No  LIVING ENVIRONMENT: Lives with: lives alone Lives in: House/apartment Stairs: Yes: External: 5 steps; on right going up Has following equipment at home: Grab bars  PLOF: Independent and Independent with basic ADLs  PATIENT GOALS: "hoping to get rid of the balance issue"  OBJECTIVE:  Note: Objective measures were completed at Evaluation unless otherwise noted.  DIAGNOSTIC FINDINGS:  03/20/23 NUCLEAR MEDICINE BRAIN IMAGING WITH SPECT  (DaTscan )   TECHNIQUE: SPECT images of the brain were obtained after intravenous injection of radiopharmaceutical. 4 hour post injection imaging. Appropriate positioning.   130 mg IO STAT given orally for thyroid blockade.   RADIOPHARMACEUTICALS:  4.9 millicuries I 123 Ioflupane   COMPARISON:  None Available.  FINDINGS: Symmetric intense uptake within LEFT and RIGHT striata. The heads of the caudate nuclei and the posterior striata (putamen) are normal shape. No evidence of loss of dopamine transport populations in the basal ganglia.   IMPRESSION: Ioflupane scan within normal limits. No reduced radiotracer activity in basal ganglia to suggest Parkinson's syndrome pathology.   Of note, DaTSCAN is not diagnostic of Parkinsonian syndromes, which remains a clinical  diagnosis. DaTscan is an adjuvant test to aid in the clinical diagnosis of Parkinsonian syndromes.  COGNITION: Overall cognitive status: Within functional limits for tasks assessed   SENSATION: Not tested  POSTURE:  rounded shoulders and forward head  Cervical ROM:    Active A/PROM (deg) eval  Flexion WFL  Extension WFL  Right lateral flexion   Left lateral flexion   Right rotation WFL  Left rotation WFL  (Blank rows = not tested)  LOWER EXTREMITY MMT:   MMT Right eval Left eval  Hip flexion 4+ 4+  Hip abduction 4+ 4+  Hip adduction 4+ 4+  Hip internal rotation    Hip external rotation    Knee flexion 4+ 4+  Knee extension 4+ 4+  Ankle dorsiflexion 4+ 4+  Ankle plantarflexion 4+ 4+  Ankle inversion    Ankle eversion    (Blank rows = not tested)  TRANSFERS: Assistive device utilized: None  Sit to stand: SBA Stand to sit: Complete Independence  GAIT: Gait pattern:  tends to veer to the L, decreased step length- Left, decreased stance time- Left, decreased hip/knee flexion- Left, Left steppage, and poor foot clearance- Left Distance walked: 457 ft Assistive device utilized: None Level of assistance: CGA Comments: done during  FUNCTIONAL TESTS:  5 times sit to stand: 8.83 sec 2 minute walk test: 457 ft Dynamic Gait Index: 14 MCTSIB: Condition 1: Avg of 3 trials: 9.33 sec, Condition 2: Avg of 3 trials: 2.67 sec, Condition 3: Avg of 3 trials: 8.67 sec, Condition 4: Avg of 3 trials: 2.33 sec, and Total Score: 19/120  DGI 1. Gait level surface (2) Mild Impairment: Walks 20', uses assistive devices, slower speed, mild gait deviations. 2. Change in gait speed (2) Mild Impairment: Is able to change speed but demonstrates mild gait deviations, or not gait deviations but unable to achieve a significant change in velocity, or uses an assistive device. 3. Gait with horizontal head turns (1) Moderate Impairment: Performs head turns with moderate change in gait  velocity, slows down, staggers but recovers, can continue to walk. 4. Gait with vertical head turns 1) Moderate Impairment: Performs head turns with moderate change in gait velocity, slows down, staggers but recovers, can continue to walk. 5. Gait and pivot turn (2) Mild Impairment: Pivot turns safely in > 3 seconds and stops with no loss of balance. 6. Step over obstacle (2) Mild Impairment: Is able to step over box, but must slow down and adjust steps to clear box safely. 7. Step around obstacles (2) Mild Impairment: Is able to step around both cones, but must slow down and adjust steps to clear cones. 8. Stairs (2) Mild Impairment: Alternating feet, must use rail.  TOTAL SCORE: 14 / 24  PATIENT SURVEYS:  ABC scale 1350/1600 = 84.4%  GASTROCSOLEUS: moderate restriction on B  VESTIBULAR ASSESSMENT:  GENERAL OBSERVATION: ambulatory without assistive device   SYMPTOM BEHAVIOR:  Subjective history: see above  Non-Vestibular symptoms:  none  Type of dizziness: Imbalance (Disequilibrium)  Frequency: everyday  Aggravating factors: Induced by motion: occur when walking and Occurs when  standing still   Relieving factors: rest  Progression of symptoms: worse  OCULOMOTOR EXAM:  In sitting:  Ocular Alignment: normal  Ocular ROM: No Limitations  Smooth Pursuits: intact  Saccades: intact  Oculomotor reflex R/L: intact   In standing:  Smooth Pursuits: intact, but presents with mild to moderate unsteadiness  Saccades: intact, but presents with mild to moderate unsteadiness Oculomotor reflex R/L: intact, but presents with mild to moderate unsteadiness  VESTIBULAR - OCULAR REFLEX:   In sitting Slow VOR: Normal  VOR Cancellation: Normal  Head-Impulse Test: HIT Right: negative HIT Left: negative  In standing Slow VOR: Normal, but presents with mild to moderate unsteadiness  VOR Cancellation: Normal, but presents with mild to moderate unsteadiness   POSITIONAL TESTING: Other: not  tested  MOTION SENSITIVITY:  Motion Sensitivity Quotient Intensity: 0 = none, 1 = Lightheaded, 2 = Mild, 3 = Moderate, 4 = Severe, 5 = Vomiting  Intensity  1. Sitting to supine   2. Supine to L side   3. Supine to R side   4. Supine to sitting   5. L Hallpike-Dix   6. Up from L    7. R Hallpike-Dix   8. Up from R    9. Sitting, head tipped to L knee 0  10. Head up from L knee 0  11. Sitting, head tipped to R knee 0  12. Head up from R knee 0  13. Sitting head turns x5 0  14.Sitting head nods x5   15. In stance, 180 turn to L  0  16. In stance, 180 turn to R 0                                                                                                        TREATMENT DATE:  03/28/23 Evaluation and patient education done  PATIENT EDUCATION: Education details: Educated on the pathoanatomy of balance and the vestibular system. Educated on the goals and course of rehab. Educated on the measures to reduce risk of falls at home.  HOME EXERCISE PROGRAM: None provided to date  GOALS: Goals reviewed with patient? Yes  SHORT TERM GOALS: Target date: 04/11/23  Pt will demonstrate indep in HEP to facilitate carry-over of skilled services and improve functional outcomes Goal status: INITIAL  LONG TERM GOALS: Target date: 04/25/23  Pt will improve DGI by at least 3 points in order to demonstrate clinically significant improvement in balance and decreased risk for falls  Baseline: 14 Goal status: INITIAL  2.  Pt will increase by at least 40 ft in order to demonstrate clinically significant improvement in community ambulation Baseline: 457 ft Goal status: INITIAL  3.  Pt will demonstrate an increase in mCTSIB total score by 30 seconds to facilitate ease and safety in ambulation Baseline: 19/120 Goal status: INITIAL   ASSESSMENT:  CLINICAL IMPRESSION: Patient is a 83 y.o. female who was seen today for physical therapy evaluation and treatment for balance disorder and  gait instability. Patient's condition is further defined by difficulty with walking due to impaired proprioception and vestibular  input as well decreased soft tissue extensibility. Skilled PT is required to address the impairments and functional limitations listed below.   OBJECTIVE IMPAIRMENTS: Abnormal gait, decreased activity tolerance, decreased balance, difficulty walking, decreased safety awareness, impaired perceived functional ability, and impaired flexibility.   ACTIVITY LIMITATIONS: carrying, lifting, bending, standing, squatting, and stairs  PARTICIPATION LIMITATIONS: meal prep, cleaning, laundry, shopping, community activity, and yard work  PERSONAL FACTORS: Age, Past/current experiences, and Time since onset of injury/illness/exacerbation are also affecting patient's functional outcome.   REHAB POTENTIAL: Fair    CLINICAL DECISION MAKING: Stable/uncomplicated  EVALUATION COMPLEXITY: Low   PLAN:  PT FREQUENCY: 2x/week  PT DURATION: 4 weeks  PLANNED INTERVENTIONS: 97164- PT Re-evaluation, 97110-Therapeutic exercises, 97530- Therapeutic activity, [id number]- Neuromuscular re-education, 97535- Self Care, 21308- Manual therapy, [phone]- Gait training, Patient/Family education, and Stair training  PLAN FOR NEXT SESSION: Provide HEP. Begin vestibular activities paired with balance/proprioceptive activities. May progress to gait training as tolerated.   Tish Frederickson. Wrenly Lauritsen, PT, DPT, OCS Board-Certified Clinical Specialist in Orthopedic PT PT Compact Privilege # ([place]): [id number] T 03/28/2023, 5:17 PM

## 2023-03-30 ENCOUNTER — Ambulatory Visit (HOSPITAL_COMMUNITY): Payer: Medicare HMO

## 2023-03-30 DIAGNOSIS — R262 Difficulty in walking, not elsewhere classified: Secondary | ICD-10-CM | POA: Diagnosis not present

## 2023-03-30 DIAGNOSIS — R2689 Other abnormalities of gait and mobility: Secondary | ICD-10-CM

## 2023-03-30 NOTE — Therapy (Addendum)
 OUTPATIENT PHYSICAL THERAPY VESTIBULAR TREATMENT     Patient Name: Nancy Williams MRN: [id number] DOB:1940/04/10, 83 y.o., female Today's Date: 03/30/2023  END OF SESSION:  PT End of Session - 03/30/23 1423     Visit Number 2    Number of Visits 8    Date for PT Re-Evaluation 04/25/23    Authorization Type [employer] HMO/PPO (no auth)    PT Start Time 1425    PT Stop Time 1510    PT Time Calculation (min) 45 min    Equipment Utilized During Treatment Gait belt    Activity Tolerance Patient tolerated treatment well    Behavior During Therapy WFL for tasks assessed/performed              Past Medical History:  Diagnosis Date   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    Hypothyroidism    Hashimoto's per [facility] New Patient Packet    Neuropathy    Osteoarthritis    Per patient at New patient appointment    Seasonal allergies    Vitamin D deficiency    Past Surgical History:  Procedure Laterality Date   No previous surgery     Patient Active Problem List   Diagnosis Date Noted   Hyperlipidemia 03/04/2023   Gait abnormality 03/04/2023   Anxiety 03/04/2023   Hypothyroidism     PCP: Tommie Sams, DO REFERRING PROVIDER: Ashok Croon, MD  REFERRING DIAG: R42 (ICD-10-CM) - Dizziness and giddiness R26.89 (ICD-10-CM) - Balance disorder G62.9 (ICD-10-CM) - Neuropathy R26.81 (ICD-10-CM) - Gait instability  THERAPY DIAG:  Difficulty in walking, not elsewhere classified  Balance problem  ONSET DATE: 3 years ago  Rationale for Evaluation and Treatment: Rehabilitation  SUBJECTIVE:   SUBJECTIVE STATEMENT: Doing well today. Denies pain or dizziness. No recent episode of falls.  EVAL: Arrives to the clinic with issues for her being imbalance which is more evident when patient stands and walks. Denies dizziness. Denies any sensation that the room is spinning. Denies any numbness/weakness on the legs. Patient has had the imbalance issues for  3 years without apparent reason and gradually got worse in time. Patient reports that her MD cannot figure out what's causing her imbalance. Per patient, ENT reports that the nerve on her ear could be weak. Had PT for the same issue in Fall of 2024 and is unsure if PT has helped her. Claims that she has not been diligent with her HEP after being d/c from PT. Patient reports that her balance issues have gotten worse since she was d/c. Denies any recent falls. Recently, patient was referred to outpatient PT evaluation and management. Pt accompanied by: self  PERTINENT HISTORY: Anxiety, hypothyroidism  PAIN:  Are you having pain? No  PRECAUTIONS: Fall  RED FLAGS: None   WEIGHT BEARING RESTRICTIONS: No  FALLS: Has patient fallen in last 6 months? No  LIVING ENVIRONMENT: Lives with: lives alone Lives in: House/apartment Stairs: Yes: External: 5 steps; on right going up Has following equipment at home: Grab bars  PLOF: Independent and Independent with basic ADLs  PATIENT GOALS: "hoping to get rid of the balance issue"  OBJECTIVE:  Note: Objective measures were completed at Evaluation unless otherwise noted.  DIAGNOSTIC FINDINGS:  03/20/23 NUCLEAR MEDICINE BRAIN IMAGING WITH SPECT  (DaTscan )   TECHNIQUE: SPECT images of the brain were obtained after intravenous injection of radiopharmaceutical. 4 hour post injection imaging. Appropriate positioning.   130 mg IO STAT given orally for thyroid blockade.  RADIOPHARMACEUTICALS:  4.9 millicuries I 123 Ioflupane   COMPARISON:  None Available.   FINDINGS: Symmetric intense uptake within LEFT and RIGHT striata. The heads of the caudate nuclei and the posterior striata (putamen) are normal shape. No evidence of loss of dopamine transport populations in the basal ganglia.   IMPRESSION: Ioflupane scan within normal limits. No reduced radiotracer activity in basal ganglia to suggest Parkinson's syndrome pathology.   Of note,  DaTSCAN is not diagnostic of Parkinsonian syndromes, which remains a clinical diagnosis. DaTscan is an adjuvant test to aid in the clinical diagnosis of Parkinsonian syndromes.  COGNITION: Overall cognitive status: Within functional limits for tasks assessed   SENSATION: Not tested  POSTURE:  rounded shoulders and forward head  Cervical ROM:    Active A/PROM (deg) eval  Flexion WFL  Extension WFL  Right lateral flexion   Left lateral flexion   Right rotation WFL  Left rotation WFL  (Blank rows = not tested)  LOWER EXTREMITY MMT:   MMT Right eval Left eval  Hip flexion 4+ 4+  Hip abduction 4+ 4+  Hip adduction 4+ 4+  Hip internal rotation    Hip external rotation    Knee flexion 4+ 4+  Knee extension 4+ 4+  Ankle dorsiflexion 4+ 4+  Ankle plantarflexion 4+ 4+  Ankle inversion    Ankle eversion    (Blank rows = not tested)  TRANSFERS: Assistive device utilized: None  Sit to stand: SBA Stand to sit: Complete Independence  GAIT: Gait pattern:  tends to veer to the L, decreased step length- Left, decreased stance time- Left, decreased hip/knee flexion- Left, Left steppage, and poor foot clearance- Left Distance walked: 457 ft Assistive device utilized: None Level of assistance: CGA Comments: done during  FUNCTIONAL TESTS:  5 times sit to stand: 8.83 sec 2 minute walk test: 457 ft Dynamic Gait Index: 14 MCTSIB: Condition 1: Avg of 3 trials: 9.33 sec, Condition 2: Avg of 3 trials: 2.67 sec, Condition 3: Avg of 3 trials: 8.67 sec, Condition 4: Avg of 3 trials: 2.33 sec, and Total Score: 19/120  DGI 1. Gait level surface (2) Mild Impairment: Walks 20', uses assistive devices, slower speed, mild gait deviations. 2. Change in gait speed (2) Mild Impairment: Is able to change speed but demonstrates mild gait deviations, or not gait deviations but unable to achieve a significant change in velocity, or uses an assistive device. 3. Gait with horizontal head  turns (1) Moderate Impairment: Performs head turns with moderate change in gait velocity, slows down, staggers but recovers, can continue to walk. 4. Gait with vertical head turns 1) Moderate Impairment: Performs head turns with moderate change in gait velocity, slows down, staggers but recovers, can continue to walk. 5. Gait and pivot turn (2) Mild Impairment: Pivot turns safely in > 3 seconds and stops with no loss of balance. 6. Step over obstacle (2) Mild Impairment: Is able to step over box, but must slow down and adjust steps to clear box safely. 7. Step around obstacles (2) Mild Impairment: Is able to step around both cones, but must slow down and adjust steps to clear cones. 8. Stairs (2) Mild Impairment: Alternating feet, must use rail.  TOTAL SCORE: 14 / 24  PATIENT SURVEYS:  ABC scale 1350/1600 = 84.4%  GASTROCSOLEUS: moderate restriction on B  VESTIBULAR ASSESSMENT:  GENERAL OBSERVATION: ambulatory without assistive device   SYMPTOM BEHAVIOR:  Subjective history: see above  Non-Vestibular symptoms:  none  Type of dizziness: Imbalance (Disequilibrium)  Frequency: everyday  Aggravating factors: Induced by motion: occur when walking and Occurs when standing still   Relieving factors: rest  Progression of symptoms: worse  OCULOMOTOR EXAM:  In sitting:  Ocular Alignment: normal  Ocular ROM: No Limitations  Smooth Pursuits: intact  Saccades: intact  Oculomotor reflex R/L: intact   In standing:  Smooth Pursuits: intact, but presents with mild to moderate unsteadiness  Saccades: intact, but presents with mild to moderate unsteadiness Oculomotor reflex R/L: intact, but presents with mild to moderate unsteadiness  VESTIBULAR - OCULAR REFLEX:   In sitting Slow VOR: Normal  VOR Cancellation: Normal  Head-Impulse Test: HIT Right: negative HIT Left: negative  In standing Slow VOR: Normal, but presents with mild to moderate unsteadiness  VOR Cancellation: Normal,  but presents with mild to moderate unsteadiness   POSITIONAL TESTING: Other: not tested  MOTION SENSITIVITY:  Motion Sensitivity Quotient Intensity: 0 = none, 1 = Lightheaded, 2 = Mild, 3 = Moderate, 4 = Severe, 5 = Vomiting  Intensity  1. Sitting to supine   2. Supine to L side   3. Supine to R side   4. Supine to sitting   5. L Hallpike-Dix   6. Up from L    7. R Hallpike-Dix   8. Up from R    9. Sitting, head tipped to L knee 0  10. Head up from L knee 0  11. Sitting, head tipped to R knee 0  12. Head up from R knee 0  13. Sitting head turns x5 0  14.Sitting head nods x5   15. In stance, 180 turn to L  0  16. In stance, 180 turn to R 0                                                                                                        TREATMENT DATE:  03/30/23 Reviewed goals NuStep, seat 9, level 1, 5' Seated calf stretch x 30" x  3 Standing, narrow BOS, x 1' each  Horizontal saccades, reading numbers  Vertical saccades, reading words  Horizontal smooth pursuit, identifying shapes/colors  Vertical smooth pursuit, identifying animal silhouettes  Horizontal VOR, identifying colors, 80 bpm  Vertical VOR, identifying shapes, 80 bpm  Horizontal VOR cancellation, reading words  Vertical VOR cancellation, identifying colors Tandem stance, firm surface x 30" x 2 each   03/28/23 Evaluation and patient education done  PATIENT EDUCATION: Education details: Educated on the pathoanatomy of balance and the vestibular system. Educated on the goals and course of rehab. Educated on the measures to reduce risk of falls at home.  HOME EXERCISE PROGRAM: 03/30/23 HOME EXERCISES FOR YOUR DIZZINESS Do this 2-3x/day, 5-7 days/week  GAZE STABILIZATION EXERCISES These exercises are designed to improve your eyes' ability to track objects without getting dizzy. When performing these exercises, make sure you are facing a blank wall (like a white wall) to avoid distraction. You can wear  your glasses/contact lenses if you must.   Before anything, warm-up your neck muscles by bending it forward and back 10 times, side-to-side 10  times, and slowly turn your head left and right 10 times.  Exercise 1 (Horizontal Saccades) Use two cards for this exercise on the wall in standing while holding on to a stable surface, look at the first item on the other card then look at the next item on the second card then alternate your gaze sequentially. Do NOT move your cards nor the your head. Do the following for one minute each: Identifying numbers Reading words Identifying shapes Exercise 2 (Paradigm x1) Use only one card for this exercise on the wall in standing while holding on to a stable surface, turn your head left and right at your own pace while keeping the card stationary.  Do this for one minute each while you are: Identifying numbers Reading words Identifying shapes  Exercise 4 (VOR cancellation) Use only one card for this exercise While holding the card in sitting with your arms outstretched, turn your body, arm, and head at the same time left and right at your own pace Do this for one minute each while you are: Identifying numbers Reading words Identifying shapes  Access Code: [email or web address] URL: [email or web address] Date: 03/30/2023 Prepared by: Krystal Clark  Exercises - Seated Calf Stretch with Strap  - 1-2 x daily - 5-7 x weekly - 3 reps - 30 hold - Standing Tandem Balance with Counter Support  - 1-2 x daily - 5-7 x weekly - 2 reps - 30 hold  GOALS: Goals reviewed with patient? Yes  SHORT TERM GOALS: Target date: 04/11/23  Pt will demonstrate indep in HEP to facilitate carry-over of skilled services and improve functional outcomes Goal status: INITIAL  LONG TERM GOALS: Target date: 04/25/23  Pt will improve DGI by at least 3 points in order to demonstrate clinically significant improvement in balance and decreased risk for falls  Baseline:  14 Goal status: INITIAL  2.  Pt will increase by at least 40 ft in order to demonstrate clinically significant improvement in community ambulation Baseline: 457 ft Goal status: INITIAL  3.  Pt will demonstrate an increase in mCTSIB total score by 30 seconds to facilitate ease and safety in ambulation Baseline: 19/120 Goal status: INITIAL   ASSESSMENT:  CLINICAL IMPRESSION: Interventions today were geared towards conditioning, LE flexibility, and vestibular and proprioceptive training. Tolerated all activities without worsening of symptoms. However, patient was noted to have mild to moderate unsteadiness with 2 loss of balance while doing the VOR and VOR cancellation activities. Demonstrated appropriate levels of fatigue. Provided mild amount of cueing to ensure correct execution of activity with fair to good carry-over. To date, skilled PT is required to address the impairments and improve function.  EVAL: Patient is a 83 y.o. female who was seen today for physical therapy evaluation and treatment for balance disorder and gait instability. Patient's condition is further defined by difficulty with walking due to impaired proprioception and vestibular input as well decreased soft tissue extensibility. Skilled PT is required to address the impairments and functional limitations listed below.   OBJECTIVE IMPAIRMENTS: Abnormal gait, decreased activity tolerance, decreased balance, difficulty walking, decreased safety awareness, impaired perceived functional ability, and impaired flexibility.   ACTIVITY LIMITATIONS: carrying, lifting, bending, standing, squatting, and stairs  PARTICIPATION LIMITATIONS: meal prep, cleaning, laundry, shopping, community activity, and yard work  PERSONAL FACTORS: Age, Past/current experiences, and Time since onset of injury/illness/exacerbation are also affecting patient's functional outcome.   REHAB POTENTIAL: Fair    CLINICAL DECISION MAKING:  Stable/uncomplicated  EVALUATION COMPLEXITY: Low   PLAN:  PT FREQUENCY: 2x/week  PT DURATION: 4 weeks  PLANNED INTERVENTIONS: 97164- PT Re-evaluation, 97110-Therapeutic exercises, 97530- Therapeutic activity, [id number]- Neuromuscular re-education, 97535- Self Care, [id number]- Manual therapy, [phone]- Gait training, Patient/Family education, and Stair training  PLAN FOR NEXT SESSION:Continue POC and may progress as tolerated with emphasis on vestibular activities paired with balance/proprioceptive activities. May progress to gait training as tolerated.   Tish Frederickson. Onesha Krebbs, PT, DPT, OCS Board-Certified Clinical Specialist in Orthopedic PT PT Compact Privilege # ([place]): [id number] T 03/30/2023, 3:11 PM

## 2023-03-31 DIAGNOSIS — H43813 Vitreous degeneration, bilateral: Secondary | ICD-10-CM | POA: Diagnosis not present

## 2023-03-31 DIAGNOSIS — H35422 Microcystoid degeneration of retina, left eye: Secondary | ICD-10-CM | POA: Diagnosis not present

## 2023-03-31 DIAGNOSIS — H353134 Nonexudative age-related macular degeneration, bilateral, advanced atrophic with subfoveal involvement: Secondary | ICD-10-CM | POA: Diagnosis not present

## 2023-03-31 DIAGNOSIS — H2513 Age-related nuclear cataract, bilateral: Secondary | ICD-10-CM | POA: Diagnosis not present

## 2023-04-03 ENCOUNTER — Encounter: Payer: Self-pay | Admitting: Family Medicine

## 2023-04-04 ENCOUNTER — Ambulatory Visit: Payer: Medicare HMO | Admitting: Family Medicine

## 2023-04-05 ENCOUNTER — Ambulatory Visit (HOSPITAL_COMMUNITY): Payer: Medicare HMO | Attending: Otolaryngology

## 2023-04-05 DIAGNOSIS — R262 Difficulty in walking, not elsewhere classified: Secondary | ICD-10-CM | POA: Diagnosis present

## 2023-04-05 DIAGNOSIS — R2689 Other abnormalities of gait and mobility: Secondary | ICD-10-CM | POA: Insufficient documentation

## 2023-04-05 NOTE — Therapy (Signed)
 OUTPATIENT PHYSICAL THERAPY VESTIBULAR TREATMENT     Patient Name: Nancy Williams MRN: [id number] DOB:09-Sep-1940, 83 y.o., female Today's Date: 04/05/2023  END OF SESSION:  PT End of Session - 04/05/23 1303     Visit Number 3    Number of Visits 8    Date for PT Re-Evaluation 04/25/23    Authorization Type [employer] HMO/PPO (no auth)    PT Start Time 1300    PT Stop Time 1345    PT Time Calculation (min) 45 min    Equipment Utilized During Treatment Gait belt    Activity Tolerance Patient tolerated treatment well    Behavior During Therapy WFL for tasks assessed/performed               Past Medical History:  Diagnosis Date   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    Hypothyroidism    Hashimoto's per [facility] New Patient Packet    Neuropathy    Osteoarthritis    Per patient at New patient appointment    Seasonal allergies    Vitamin D deficiency    Past Surgical History:  Procedure Laterality Date   No previous surgery     Patient Active Problem List   Diagnosis Date Noted   Hyperlipidemia 03/04/2023   Gait abnormality 03/04/2023   Anxiety 03/04/2023   Hypothyroidism     PCP: Tommie Sams, DO REFERRING PROVIDER: Ashok Croon, MD  REFERRING DIAG: R42 (ICD-10-CM) - Dizziness and giddiness R26.89 (ICD-10-CM) - Balance disorder G62.9 (ICD-10-CM) - Neuropathy R26.81 (ICD-10-CM) - Gait instability  THERAPY DIAG:  Difficulty in walking, not elsewhere classified  Balance problem  ONSET DATE: 3 years ago  Rationale for Evaluation and Treatment: Rehabilitation  SUBJECTIVE:   SUBJECTIVE STATEMENT: Doing well today. Denies pain or dizziness. No recent episode of falls. Patient reports that she does not know if she's doing her HEP right.  EVAL: Arrives to the clinic with issues for her being imbalance which is more evident when patient stands and walks. Denies dizziness. Denies any sensation that the room is spinning. Denies any  numbness/weakness on the legs. Patient has had the imbalance issues for 3 years without apparent reason and gradually got worse in time. Patient reports that her MD cannot figure out what's causing her imbalance. Per patient, ENT reports that the nerve on her ear could be weak. Had PT for the same issue in Fall of 2024 and is unsure if PT has helped her. Claims that she has not been diligent with her HEP after being d/c from PT. Patient reports that her balance issues have gotten worse since she was d/c. Denies any recent falls. Recently, patient was referred to outpatient PT evaluation and management. Pt accompanied by: self  PERTINENT HISTORY: Anxiety, hypothyroidism  PAIN:  Are you having pain? No  PRECAUTIONS: Fall  RED FLAGS: None   WEIGHT BEARING RESTRICTIONS: No  FALLS: Has patient fallen in last 6 months? No  LIVING ENVIRONMENT: Lives with: lives alone Lives in: House/apartment Stairs: Yes: External: 5 steps; on right going up Has following equipment at home: Grab bars  PLOF: Independent and Independent with basic ADLs  PATIENT GOALS: "hoping to get rid of the balance issue"  OBJECTIVE:  Note: Objective measures were completed at Evaluation unless otherwise noted.  DIAGNOSTIC FINDINGS:  03/20/23 NUCLEAR MEDICINE BRAIN IMAGING WITH SPECT  (DaTscan )   TECHNIQUE: SPECT images of the brain were obtained after intravenous injection of radiopharmaceutical. 4 hour post injection imaging. Appropriate  positioning.   130 mg IO STAT given orally for thyroid blockade.   RADIOPHARMACEUTICALS:  4.9 millicuries I 123 Ioflupane   COMPARISON:  None Available.   FINDINGS: Symmetric intense uptake within LEFT and RIGHT striata. The heads of the caudate nuclei and the posterior striata (putamen) are normal shape. No evidence of loss of dopamine transport populations in the basal ganglia.   IMPRESSION: Ioflupane scan within normal limits. No reduced radiotracer activity in  basal ganglia to suggest Parkinson's syndrome pathology.   Of note, DaTSCAN is not diagnostic of Parkinsonian syndromes, which remains a clinical diagnosis. DaTscan is an adjuvant test to aid in the clinical diagnosis of Parkinsonian syndromes.  COGNITION: Overall cognitive status: Within functional limits for tasks assessed   SENSATION: Not tested  POSTURE:  rounded shoulders and forward head  Cervical ROM:    Active A/PROM (deg) eval  Flexion WFL  Extension WFL  Right lateral flexion   Left lateral flexion   Right rotation WFL  Left rotation WFL  (Blank rows = not tested)  LOWER EXTREMITY MMT:   MMT Right eval Left eval  Hip flexion 4+ 4+  Hip abduction 4+ 4+  Hip adduction 4+ 4+  Hip internal rotation    Hip external rotation    Knee flexion 4+ 4+  Knee extension 4+ 4+  Ankle dorsiflexion 4+ 4+  Ankle plantarflexion 4+ 4+  Ankle inversion    Ankle eversion    (Blank rows = not tested)  TRANSFERS: Assistive device utilized: None  Sit to stand: SBA Stand to sit: Complete Independence  GAIT: Gait pattern:  tends to veer to the L, decreased step length- Left, decreased stance time- Left, decreased hip/knee flexion- Left, Left steppage, and poor foot clearance- Left Distance walked: 457 ft Assistive device utilized: None Level of assistance: CGA Comments: done during  FUNCTIONAL TESTS:  5 times sit to stand: 8.83 sec 2 minute walk test: 457 ft Dynamic Gait Index: 14 MCTSIB: Condition 1: Avg of 3 trials: 9.33 sec, Condition 2: Avg of 3 trials: 2.67 sec, Condition 3: Avg of 3 trials: 8.67 sec, Condition 4: Avg of 3 trials: 2.33 sec, and Total Score: 19/120  DGI 1. Gait level surface (2) Mild Impairment: Walks 20', uses assistive devices, slower speed, mild gait deviations. 2. Change in gait speed (2) Mild Impairment: Is able to change speed but demonstrates mild gait deviations, or not gait deviations but unable to achieve a significant change in  velocity, or uses an assistive device. 3. Gait with horizontal head turns (1) Moderate Impairment: Performs head turns with moderate change in gait velocity, slows down, staggers but recovers, can continue to walk. 4. Gait with vertical head turns 1) Moderate Impairment: Performs head turns with moderate change in gait velocity, slows down, staggers but recovers, can continue to walk. 5. Gait and pivot turn (2) Mild Impairment: Pivot turns safely in > 3 seconds and stops with no loss of balance. 6. Step over obstacle (2) Mild Impairment: Is able to step over box, but must slow down and adjust steps to clear box safely. 7. Step around obstacles (2) Mild Impairment: Is able to step around both cones, but must slow down and adjust steps to clear cones. 8. Stairs (2) Mild Impairment: Alternating feet, must use rail.  TOTAL SCORE: 14 / 24  PATIENT SURVEYS:  ABC scale 1350/1600 = 84.4%  GASTROCSOLEUS: moderate restriction on B  VESTIBULAR ASSESSMENT:  GENERAL OBSERVATION: ambulatory without assistive device   SYMPTOM BEHAVIOR:  Subjective  history: see above  Non-Vestibular symptoms:  none  Type of dizziness: Imbalance (Disequilibrium)  Frequency: everyday  Aggravating factors: Induced by motion: occur when walking and Occurs when standing still   Relieving factors: rest  Progression of symptoms: worse  OCULOMOTOR EXAM:  In sitting:  Ocular Alignment: normal  Ocular ROM: No Limitations  Smooth Pursuits: intact  Saccades: intact  Oculomotor reflex R/L: intact   In standing:  Smooth Pursuits: intact, but presents with mild to moderate unsteadiness  Saccades: intact, but presents with mild to moderate unsteadiness Oculomotor reflex R/L: intact, but presents with mild to moderate unsteadiness  VESTIBULAR - OCULAR REFLEX:   In sitting Slow VOR: Normal  VOR Cancellation: Normal  Head-Impulse Test: HIT Right: negative HIT Left: negative  In standing Slow VOR: Normal, but  presents with mild to moderate unsteadiness  VOR Cancellation: Normal, but presents with mild to moderate unsteadiness   POSITIONAL TESTING: Other: not tested  MOTION SENSITIVITY:  Motion Sensitivity Quotient Intensity: 0 = none, 1 = Lightheaded, 2 = Mild, 3 = Moderate, 4 = Severe, 5 = Vomiting  Intensity  1. Sitting to supine   2. Supine to L side   3. Supine to R side   4. Supine to sitting   5. L Hallpike-Dix   6. Up from L    7. R Hallpike-Dix   8. Up from R    9. Sitting, head tipped to L knee 0  10. Head up from L knee 0  11. Sitting, head tipped to R knee 0  12. Head up from R knee 0  13. Sitting head turns x5 0  14.Sitting head nods x5   15. In stance, 180 turn to L  0  16. In stance, 180 turn to R 0                                                                                                        TREATMENT DATE:  04/05/23 NuStep, seat 9, level 1, 5' Gastrocnemius slant board stretch x 30" x 3 Cervical AROM flex/ext, rot x 10 on each Standing heel/toe raises x 10 x 2 Standing on foam, normal BOS, x 1' each  Horizontal saccades, reading numbers  Vertical saccades, reading words  Horizontal smooth pursuit, identifying shapes/colors  Vertical smooth pursuit, identifying animal silhouettes  Horizontal VOR, identifying colors, 80 bpm  Vertical VOR, identifying shapes, 80 bpm  Horizontal VOR cancellation, reading words  Vertical VOR cancellation, identifying colors Tandem stance, foam x 30" x 2 each Sit-to-stand, with Tidal tank log on chest x 10 x 2 Walking 80 ft Tidal tank log on chest x 10 x 2   03/30/23 Reviewed goals NuStep, seat 9, level 1, 5' Seated calf stretch x 30" x  3 Standing, narrow BOS, x 1' each  Horizontal saccades, reading numbers  Vertical saccades, reading words  Horizontal smooth pursuit, identifying shapes/colors  Vertical smooth pursuit, identifying animal silhouettes  Horizontal VOR, identifying colors, 80 bpm  Vertical VOR,  identifying shapes, 80 bpm  Horizontal VOR cancellation, reading words  Vertical  VOR cancellation, identifying colors Tandem stance, firm surface x 30" x 2 each   03/28/23 Evaluation and patient education done  PATIENT EDUCATION: Education details: Educated on the pathoanatomy of balance and the vestibular system. Educated on the goals and course of rehab. Educated on the measures to reduce risk of falls at home.  HOME EXERCISE PROGRAM: 03/30/23 HOME EXERCISES FOR YOUR DIZZINESS Do this 2-3x/day, 5-7 days/week  GAZE STABILIZATION EXERCISES These exercises are designed to improve your eyes' ability to track objects without getting dizzy. When performing these exercises, make sure you are facing a blank wall (like a white wall) to avoid distraction. You can wear your glasses/contact lenses if you must.   Before anything, warm-up your neck muscles by bending it forward and back 10 times, side-to-side 10 times, and slowly turn your head left and right 10 times.  Exercise 1 (Horizontal Saccades) Use two cards for this exercise on the wall in standing while holding on to a stable surface, look at the first item on the other card then look at the next item on the second card then alternate your gaze sequentially. Do NOT move your cards nor the your head. Do the following for one minute each: Identifying numbers Reading words Identifying shapes Exercise 2 (Paradigm x1) Use only one card for this exercise on the wall in standing while holding on to a stable surface, turn your head left and right at your own pace while keeping the card stationary.  Do this for one minute each while you are: Identifying numbers Reading words Identifying shapes  Exercise 4 (VOR cancellation) Use only one card for this exercise While holding the card in sitting with your arms outstretched, turn your body, arm, and head at the same time left and right at your own pace Do this for one minute each while you  are: Identifying numbers Reading words Identifying shapes  Access Code: [email or web address] URL: [email or web address] 04/05/2023 - Heel Toe Raises with Counter Support  - 1-2 x daily - 5-7 x weekly - 2 sets - 10 reps  Date: 03/30/2023 Prepared by: Krystal Clark  Exercises - Seated Calf Stretch with Strap  - 1-2 x daily - 5-7 x weekly - 3 reps - 30 hold - Standing Tandem Balance with Counter Support  - 1-2 x daily - 5-7 x weekly - 2 reps - 30 hold  GOALS: Goals reviewed with patient? Yes  SHORT TERM GOALS: Target date: 04/11/23  Pt will demonstrate indep in HEP to facilitate carry-over of skilled services and improve functional outcomes Goal status: INITIAL  LONG TERM GOALS: Target date: 04/25/23  Pt will improve DGI by at least 3 points in order to demonstrate clinically significant improvement in balance and decreased risk for falls  Baseline: 14 Goal status: INITIAL  2.  Pt will increase by at least 40 ft in order to demonstrate clinically significant improvement in community ambulation Baseline: 457 ft Goal status: INITIAL  3.  Pt will demonstrate an increase in mCTSIB total score by 30 seconds to facilitate ease and safety in ambulation Baseline: 19/120 Goal status: INITIAL   ASSESSMENT:  CLINICAL IMPRESSION: Interventions today were geared towards conditioning, LE flexibility, and vestibular and proprioceptive training. Tolerated all activities without worsening of symptoms. However, patient was noted to have mild to moderate unsteadiness with 2 loss of balance while doing the VOR and VOR cancellation activities even on smooth pursuit activities. Demonstrated appropriate levels of fatigue. Provided mild amount of cueing to ensure correct execution of  activity with fair to good carry-over especially on identifying shapes. To date, skilled PT is required to address the impairments and improve function.  EVAL: Patient is a 83 y.o. female who was seen today for  physical therapy evaluation and treatment for balance disorder and gait instability. Patient's condition is further defined by difficulty with walking due to impaired proprioception and vestibular input as well decreased soft tissue extensibility. Skilled PT is required to address the impairments and functional limitations listed below.   OBJECTIVE IMPAIRMENTS: Abnormal gait, decreased activity tolerance, decreased balance, difficulty walking, decreased safety awareness, impaired perceived functional ability, and impaired flexibility.   ACTIVITY LIMITATIONS: carrying, lifting, bending, standing, squatting, and stairs  PARTICIPATION LIMITATIONS: meal prep, cleaning, laundry, shopping, community activity, and yard work  PERSONAL FACTORS: Age, Past/current experiences, and Time since onset of injury/illness/exacerbation are also affecting patient's functional outcome.   REHAB POTENTIAL: Fair    CLINICAL DECISION MAKING: Stable/uncomplicated  EVALUATION COMPLEXITY: Low   PLAN:  PT FREQUENCY: 2x/week  PT DURATION: 4 weeks  PLANNED INTERVENTIONS: 97164- PT Re-evaluation, 97110-Therapeutic exercises, 97530- Therapeutic activity, [id number]- Neuromuscular re-education, 97535- Self Care, [id number]- Manual therapy, [id number]- Gait training, Patient/Family education, and Stair training  PLAN FOR NEXT SESSION:Continue POC and may progress as tolerated with emphasis on vestibular activities paired with balance/proprioceptive activities. May progress to gait training as tolerated.   Tish Frederickson. Chanda Laperle, PT, DPT, OCS Board-Certified Clinical Specialist in Orthopedic PT PT Compact Privilege # ([place]): [id number] T 04/05/2023, 1:03 PM

## 2023-04-06 ENCOUNTER — Ambulatory Visit (HOSPITAL_COMMUNITY): Payer: Medicare HMO

## 2023-04-06 DIAGNOSIS — R2689 Other abnormalities of gait and mobility: Secondary | ICD-10-CM

## 2023-04-06 DIAGNOSIS — R262 Difficulty in walking, not elsewhere classified: Secondary | ICD-10-CM | POA: Diagnosis not present

## 2023-04-06 NOTE — Therapy (Signed)
 OUTPATIENT PHYSICAL THERAPY VESTIBULAR TREATMENT     Patient Name: Nancy Williams MRN: [id number] DOB:10/19/1940, 83 y.o., female Today's Date: 04/06/2023  END OF SESSION:  PT End of Session - 04/06/23 1349     Visit Number 4    Number of Visits 8    Date for PT Re-Evaluation 04/25/23    Authorization Type [employer] HMO/PPO (no auth)    PT Start Time 1345    PT Stop Time 1425    PT Time Calculation (min) 40 min    Equipment Utilized During Treatment Gait belt    Activity Tolerance Patient tolerated treatment well    Behavior During Therapy WFL for tasks assessed/performed                Past Medical History:  Diagnosis Date   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    Hypothyroidism    Hashimoto's per [facility] New Patient Packet    Neuropathy    Osteoarthritis    Per patient at New patient appointment    Seasonal allergies    Vitamin D deficiency    Past Surgical History:  Procedure Laterality Date   No previous surgery     Patient Active Problem List   Diagnosis Date Noted   Hyperlipidemia 03/04/2023   Gait abnormality 03/04/2023   Anxiety 03/04/2023   Hypothyroidism     PCP: Tommie Sams, DO REFERRING PROVIDER: Ashok Croon, MD  REFERRING DIAG: R42 (ICD-10-CM) - Dizziness and giddiness R26.89 (ICD-10-CM) - Balance disorder G62.9 (ICD-10-CM) - Neuropathy R26.81 (ICD-10-CM) - Gait instability  THERAPY DIAG:  Balance problem  Difficulty in walking, not elsewhere classified  ONSET DATE: 3 years ago  Rationale for Evaluation and Treatment: Rehabilitation  SUBJECTIVE:   SUBJECTIVE STATEMENT: Doing well today. Denies pain or dizziness. No recent episode of falls. Patient reports that her family told her that she's walking much better.  EVAL: Arrives to the clinic with issues for her being imbalance which is more evident when patient stands and walks. Denies dizziness. Denies any sensation that the room is spinning. Denies  any numbness/weakness on the legs. Patient has had the imbalance issues for 3 years without apparent reason and gradually got worse in time. Patient reports that her MD cannot figure out what's causing her imbalance. Per patient, ENT reports that the nerve on her ear could be weak. Had PT for the same issue in Fall of 2024 and is unsure if PT has helped her. Claims that she has not been diligent with her HEP after being d/c from PT. Patient reports that her balance issues have gotten worse since she was d/c. Denies any recent falls. Recently, patient was referred to outpatient PT evaluation and management. Pt accompanied by: self  PERTINENT HISTORY: Anxiety, hypothyroidism  PAIN:  Are you having pain? No  PRECAUTIONS: Fall  RED FLAGS: None   WEIGHT BEARING RESTRICTIONS: No  FALLS: Has patient fallen in last 6 months? No  LIVING ENVIRONMENT: Lives with: lives alone Lives in: House/apartment Stairs: Yes: External: 5 steps; on right going up Has following equipment at home: Grab bars  PLOF: Independent and Independent with basic ADLs  PATIENT GOALS: "hoping to get rid of the balance issue"  OBJECTIVE:  Note: Objective measures were completed at Evaluation unless otherwise noted.  DIAGNOSTIC FINDINGS:  03/20/23 NUCLEAR MEDICINE BRAIN IMAGING WITH SPECT  (DaTscan )   TECHNIQUE: SPECT images of the brain were obtained after intravenous injection of radiopharmaceutical. 4 hour post injection imaging. Appropriate  positioning.   130 mg IO STAT given orally for thyroid blockade.   RADIOPHARMACEUTICALS:  4.9 millicuries I 123 Ioflupane   COMPARISON:  None Available.   FINDINGS: Symmetric intense uptake within LEFT and RIGHT striata. The heads of the caudate nuclei and the posterior striata (putamen) are normal shape. No evidence of loss of dopamine transport populations in the basal ganglia.   IMPRESSION: Ioflupane scan within normal limits. No reduced radiotracer activity in  basal ganglia to suggest Parkinson's syndrome pathology.   Of note, DaTSCAN is not diagnostic of Parkinsonian syndromes, which remains a clinical diagnosis. DaTscan is an adjuvant test to aid in the clinical diagnosis of Parkinsonian syndromes.  COGNITION: Overall cognitive status: Within functional limits for tasks assessed   SENSATION: Not tested  POSTURE:  rounded shoulders and forward head  Cervical ROM:    Active A/PROM (deg) eval  Flexion WFL  Extension WFL  Right lateral flexion   Left lateral flexion   Right rotation WFL  Left rotation WFL  (Blank rows = not tested)  LOWER EXTREMITY MMT:   MMT Right eval Left eval  Hip flexion 4+ 4+  Hip abduction 4+ 4+  Hip adduction 4+ 4+  Hip internal rotation    Hip external rotation    Knee flexion 4+ 4+  Knee extension 4+ 4+  Ankle dorsiflexion 4+ 4+  Ankle plantarflexion 4+ 4+  Ankle inversion    Ankle eversion    (Blank rows = not tested)  TRANSFERS: Assistive device utilized: None  Sit to stand: SBA Stand to sit: Complete Independence  GAIT: Gait pattern:  tends to veer to the L, decreased step length- Left, decreased stance time- Left, decreased hip/knee flexion- Left, Left steppage, and poor foot clearance- Left Distance walked: 457 ft Assistive device utilized: None Level of assistance: CGA Comments: done during  FUNCTIONAL TESTS:  5 times sit to stand: 8.83 sec 2 minute walk test: 457 ft Dynamic Gait Index: 14 MCTSIB: Condition 1: Avg of 3 trials: 9.33 sec, Condition 2: Avg of 3 trials: 2.67 sec, Condition 3: Avg of 3 trials: 8.67 sec, Condition 4: Avg of 3 trials: 2.33 sec, and Total Score: 19/120  DGI 1. Gait level surface (2) Mild Impairment: Walks 20', uses assistive devices, slower speed, mild gait deviations. 2. Change in gait speed (2) Mild Impairment: Is able to change speed but demonstrates mild gait deviations, or not gait deviations but unable to achieve a significant change in  velocity, or uses an assistive device. 3. Gait with horizontal head turns (1) Moderate Impairment: Performs head turns with moderate change in gait velocity, slows down, staggers but recovers, can continue to walk. 4. Gait with vertical head turns 1) Moderate Impairment: Performs head turns with moderate change in gait velocity, slows down, staggers but recovers, can continue to walk. 5. Gait and pivot turn (2) Mild Impairment: Pivot turns safely in > 3 seconds and stops with no loss of balance. 6. Step over obstacle (2) Mild Impairment: Is able to step over box, but must slow down and adjust steps to clear box safely. 7. Step around obstacles (2) Mild Impairment: Is able to step around both cones, but must slow down and adjust steps to clear cones. 8. Stairs (2) Mild Impairment: Alternating feet, must use rail.  TOTAL SCORE: 14 / 24  PATIENT SURVEYS:  ABC scale 1350/1600 = 84.4%  GASTROCSOLEUS: moderate restriction on B  VESTIBULAR ASSESSMENT:  GENERAL OBSERVATION: ambulatory without assistive device   SYMPTOM BEHAVIOR:  Subjective  history: see above  Non-Vestibular symptoms:  none  Type of dizziness: Imbalance (Disequilibrium)  Frequency: everyday  Aggravating factors: Induced by motion: occur when walking and Occurs when standing still   Relieving factors: rest  Progression of symptoms: worse  OCULOMOTOR EXAM:  In sitting:  Ocular Alignment: normal  Ocular ROM: No Limitations  Smooth Pursuits: intact  Saccades: intact  Oculomotor reflex R/L: intact   In standing:  Smooth Pursuits: intact, but presents with mild to moderate unsteadiness  Saccades: intact, but presents with mild to moderate unsteadiness Oculomotor reflex R/L: intact, but presents with mild to moderate unsteadiness  VESTIBULAR - OCULAR REFLEX:   In sitting Slow VOR: Normal  VOR Cancellation: Normal  Head-Impulse Test: HIT Right: negative HIT Left: negative  In standing Slow VOR: Normal, but  presents with mild to moderate unsteadiness  VOR Cancellation: Normal, but presents with mild to moderate unsteadiness   POSITIONAL TESTING: Other: not tested  MOTION SENSITIVITY:  Motion Sensitivity Quotient Intensity: 0 = none, 1 = Lightheaded, 2 = Mild, 3 = Moderate, 4 = Severe, 5 = Vomiting  Intensity  1. Sitting to supine   2. Supine to L side   3. Supine to R side   4. Supine to sitting   5. L Hallpike-Dix   6. Up from L    7. R Hallpike-Dix   8. Up from R    9. Sitting, head tipped to L knee 0  10. Head up from L knee 0  11. Sitting, head tipped to R knee 0  12. Head up from R knee 0  13. Sitting head turns x5 0  14.Sitting head nods x5   15. In stance, 180 turn to L  0  16. In stance, 180 turn to R 0                                                                                                        TREATMENT DATE:  04/06/23 NuStep, seat 9, level 2, 5' Gastrocnemius slant board stretch x 30" x 3 Sit-to-stand, with Tidal tank log on chest x 10 x 2 Walking with horizontal head turns 80 ft Tidal tank log on chest x 10 x 2 Walking with vertical head turns 80 ft Tidal tank log on chest x 10 x 2 Tandem walking x 20 ft x 2 rounds Body craft resisted walking forward/back/sides x 2 plates x 3 rounds each Chop and lift while on foam x 10 x 2 Forward stepping over cones x 3 rounds Standing on dyna discs while matching colors on the cone x 1'  04/05/23 NuStep, seat 9, level 1, 5' Gastrocnemius slant board stretch x 30" x 3 Cervical AROM flex/ext, rot x 10 on each Standing heel/toe raises x 10 x 2 Standing on foam, normal BOS, x 1' each  Horizontal saccades, reading numbers  Vertical saccades, reading words  Horizontal smooth pursuit, identifying shapes/colors  Vertical smooth pursuit, identifying animal silhouettes  Horizontal VOR, identifying colors, 80 bpm  Vertical VOR, identifying shapes, 80 bpm  Horizontal VOR cancellation, reading words  Vertical VOR  cancellation, identifying colors Tandem stance, foam x 30" x 2 each Sit-to-stand, with Tidal tank log on chest x 10 x 2 Walking 80 ft Tidal tank log on chest x 10 x 2   03/30/23 Reviewed goals NuStep, seat 9, level 1, 5' Seated calf stretch x 30" x  3 Standing, narrow BOS, x 1' each  Horizontal saccades, reading numbers  Vertical saccades, reading words  Horizontal smooth pursuit, identifying shapes/colors  Vertical smooth pursuit, identifying animal silhouettes  Horizontal VOR, identifying colors, 80 bpm  Vertical VOR, identifying shapes, 80 bpm  Horizontal VOR cancellation, reading words  Vertical VOR cancellation, identifying colors Tandem stance, firm surface x 30" x 2 each   03/28/23 Evaluation and patient education done  PATIENT EDUCATION: Education details: Educated on the pathoanatomy of balance and the vestibular system. Educated on the goals and course of rehab. Educated on the measures to reduce risk of falls at home.  HOME EXERCISE PROGRAM: 03/30/23 HOME EXERCISES FOR YOUR DIZZINESS Do this 2-3x/day, 5-7 days/week  GAZE STABILIZATION EXERCISES These exercises are designed to improve your eyes' ability to track objects without getting dizzy. When performing these exercises, make sure you are facing a blank wall (like a white wall) to avoid distraction. You can wear your glasses/contact lenses if you must.   Before anything, warm-up your neck muscles by bending it forward and back 10 times, side-to-side 10 times, and slowly turn your head left and right 10 times.  Exercise 1 (Horizontal Saccades) Use two cards for this exercise on the wall in standing while holding on to a stable surface, look at the first item on the other card then look at the next item on the second card then alternate your gaze sequentially. Do NOT move your cards nor the your head. Do the following for one minute each: Identifying numbers Reading words Identifying shapes Exercise 2 (Paradigm  x1) Use only one card for this exercise on the wall in standing while holding on to a stable surface, turn your head left and right at your own pace while keeping the card stationary.  Do this for one minute each while you are: Identifying numbers Reading words Identifying shapes  Exercise 4 (VOR cancellation) Use only one card for this exercise While holding the card in sitting with your arms outstretched, turn your body, arm, and head at the same time left and right at your own pace Do this for one minute each while you are: Identifying numbers Reading words Identifying shapes  Access Code: [email or web address] URL: [email or web address] 04/05/2023 - Heel Toe Raises with Counter Support  - 1-2 x daily - 5-7 x weekly - 2 sets - 10 reps  Date: 03/30/2023 Prepared by: Krystal Clark  Exercises - Seated Calf Stretch with Strap  - 1-2 x daily - 5-7 x weekly - 3 reps - 30 hold - Standing Tandem Balance with Counter Support  - 1-2 x daily - 5-7 x weekly - 2 reps - 30 hold  GOALS: Goals reviewed with patient? Yes  SHORT TERM GOALS: Target date: 04/11/23  Pt will demonstrate indep in HEP to facilitate carry-over of skilled services and improve functional outcomes Goal status: INITIAL  LONG TERM GOALS: Target date: 04/25/23  Pt will improve DGI by at least 3 points in order to demonstrate clinically significant improvement in balance and decreased risk for falls  Baseline: 14 Goal status: INITIAL  2.  Pt will increase by at least 40 ft in order to demonstrate clinically  significant improvement in community ambulation Baseline: 457 ft Goal status: INITIAL  3.  Pt will demonstrate an increase in mCTSIB total score by 30 seconds to facilitate ease and safety in ambulation Baseline: 19/120 Goal status: INITIAL   ASSESSMENT:  CLINICAL IMPRESSION: Interventions today were geared towards conditioning, LE flexibility, and vestibular and proprioceptive training. Tolerated  all activities without worsening of symptoms. However, patient was noted to have mild difficulty with the chop and lift due to limited shoulder ROM. Slight to mild unsteadiness noted on resisted walking and standing on dyna disc and head turns while walking with a tidal tank due to impaired proprioception. Demonstrated appropriate levels of fatigue. Provided mild amount of cueing to ensure correct execution of activity with fair to good carry-over. To date, skilled PT is required to address the impairments and improve function.  EVAL: Patient is a 83 y.o. female who was seen today for physical therapy evaluation and treatment for balance disorder and gait instability. Patient's condition is further defined by difficulty with walking due to impaired proprioception and vestibular input as well decreased soft tissue extensibility. Skilled PT is required to address the impairments and functional limitations listed below.   OBJECTIVE IMPAIRMENTS: Abnormal gait, decreased activity tolerance, decreased balance, difficulty walking, decreased safety awareness, impaired perceived functional ability, and impaired flexibility.   ACTIVITY LIMITATIONS: carrying, lifting, bending, standing, squatting, and stairs  PARTICIPATION LIMITATIONS: meal prep, cleaning, laundry, shopping, community activity, and yard work  PERSONAL FACTORS: Age, Past/current experiences, and Time since onset of injury/illness/exacerbation are also affecting patient's functional outcome.   REHAB POTENTIAL: Fair    CLINICAL DECISION MAKING: Stable/uncomplicated  EVALUATION COMPLEXITY: Low   PLAN:  PT FREQUENCY: 2x/week  PT DURATION: 4 weeks  PLANNED INTERVENTIONS: 97164- PT Re-evaluation, 97110-Therapeutic exercises, 97530- Therapeutic activity, [id number]- Neuromuscular re-education, 97535- Self Care, [id number]- Manual therapy, [phone]- Gait training, Patient/Family education, and Stair training  PLAN FOR NEXT SESSION:Continue POC and may  progress as tolerated with emphasis on vestibular activities paired with balance/proprioceptive activities. May progress to gait training as tolerated.   Tish Frederickson. Munir Victorian, PT, DPT, OCS Board-Certified Clinical Specialist in Orthopedic PT PT Compact Privilege # ([place]): [phone] T 04/06/2023, 2:31 PM

## 2023-04-10 ENCOUNTER — Ambulatory Visit: Payer: Medicare HMO | Admitting: Neurology

## 2023-04-10 NOTE — Telephone Encounter (Signed)
 Tommie Sams, DO     04/09/23 10:56 PM There are not very many choices here in [place].  Dr. Tenny Craw is in [place] and does a good job.

## 2023-04-12 ENCOUNTER — Ambulatory Visit (HOSPITAL_COMMUNITY): Payer: Medicare HMO | Admitting: Physical Therapy

## 2023-04-12 DIAGNOSIS — R262 Difficulty in walking, not elsewhere classified: Secondary | ICD-10-CM | POA: Diagnosis not present

## 2023-04-12 DIAGNOSIS — R2689 Other abnormalities of gait and mobility: Secondary | ICD-10-CM

## 2023-04-12 NOTE — Therapy (Signed)
 OUTPATIENT PHYSICAL THERAPY VESTIBULAR TREATMENT     Patient Name: Nancy Williams MRN: [id number] DOB:09/11/1940, 83 y.o., female Today's Date: 04/12/2023  END OF SESSION:  PT End of Session - 04/12/23 0846     Visit Number 5    Number of Visits 8    Date for PT Re-Evaluation 04/25/23    Authorization Type [employer] HMO/PPO (no auth)    PT Start Time 0800    PT Stop Time 0840    PT Time Calculation (min) 40 min    Equipment Utilized During Treatment Gait belt    Activity Tolerance Patient tolerated treatment well    Behavior During Therapy WFL for tasks assessed/performed                 Past Medical History:  Diagnosis Date   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    Hypothyroidism    Hashimoto's per [facility] New Patient Packet    Neuropathy    Osteoarthritis    Per patient at New patient appointment    Seasonal allergies    Vitamin D deficiency    Past Surgical History:  Procedure Laterality Date   No previous surgery     Patient Active Problem List   Diagnosis Date Noted   Hyperlipidemia 03/04/2023   Gait abnormality 03/04/2023   Anxiety 03/04/2023   Hypothyroidism     PCP: Tommie Sams, DO REFERRING PROVIDER: Ashok Croon, MD  REFERRING DIAG: R42 (ICD-10-CM) - Dizziness and giddiness R26.89 (ICD-10-CM) - Balance disorder G62.9 (ICD-10-CM) - Neuropathy R26.81 (ICD-10-CM) - Gait instability  THERAPY DIAG:  Balance problem  Difficulty in walking, not elsewhere classified  ONSET DATE: 3 years ago  Rationale for Evaluation and Treatment: Rehabilitation  SUBJECTIVE:   SUBJECTIVE STATEMENT: Pt states she has no difficulty with her HEP  PERTINENT HISTORY: Anxiety, hypothyroidism  PAIN:  Are you having pain? No  PRECAUTIONS: Fall  RED FLAGS: None   WEIGHT BEARING RESTRICTIONS: No  FALLS: Has patient fallen in last 6 months? No  LIVING ENVIRONMENT: Lives with: lives alone Lives in:  House/apartment Stairs: Yes: External: 5 steps; on right going up Has following equipment at home: Grab bars  PLOF: Independent and Independent with basic ADLs  PATIENT GOALS: "hoping to get rid of the balance issue"  OBJECTIVE:  Note: Objective measures were completed at Evaluation unless otherwise noted.  DIAGNOSTIC FINDINGS:  03/20/23 NUCLEAR MEDICINE BRAIN IMAGING WITH SPECT  (DaTscan )   TECHNIQUE: SPECT images of the brain were obtained after intravenous injection of radiopharmaceutical. 4 hour post injection imaging. Appropriate positioning.   130 mg IO STAT given orally for thyroid blockade.   RADIOPHARMACEUTICALS:  4.9 millicuries I 123 Ioflupane   COMPARISON:  None Available.   FINDINGS: Symmetric intense uptake within LEFT and RIGHT striata. The heads of the caudate nuclei and the posterior striata (putamen) are normal shape. No evidence of loss of dopamine transport populations in the basal ganglia.   IMPRESSION: Ioflupane scan within normal limits. No reduced radiotracer activity in basal ganglia to suggest Parkinson's syndrome pathology.   Of note, DaTSCAN is not diagnostic of Parkinsonian syndromes, which remains a clinical diagnosis. DaTscan is an adjuvant test to aid in the clinical diagnosis of Parkinsonian syndromes.  COGNITION: Overall cognitive status: Within functional limits for tasks assessed   SENSATION: Not tested  POSTURE:  rounded shoulders and forward head  Cervical ROM:    Active A/PROM (deg) eval  Flexion WFL  Extension WFL  Right lateral flexion  Left lateral flexion   Right rotation WFL  Left rotation WFL  (Blank rows = not tested)  LOWER EXTREMITY MMT:   MMT Right eval Left eval  Hip flexion 4+ 4+  Hip abduction 4+ 4+  Hip adduction 4+ 4+  Hip internal rotation    Hip external rotation    Knee flexion 4+ 4+  Knee extension 4+ 4+  Ankle dorsiflexion 4+ 4+  Ankle plantarflexion 4+ 4+  Ankle inversion    Ankle  eversion    (Blank rows = not tested)  TRANSFERS: Assistive device utilized: None  Sit to stand: SBA Stand to sit: Complete Independence  GAIT: Gait pattern:  tends to veer to the L, decreased step length- Left, decreased stance time- Left, decreased hip/knee flexion- Left, Left steppage, and poor foot clearance- Left Distance walked: 457 ft Assistive device utilized: None Level of assistance: CGA Comments: done during  FUNCTIONAL TESTS:  5 times sit to stand: 8.83 sec 2 minute walk test: 457 ft Dynamic Gait Index: 14 MCTSIB: Condition 1: Avg of 3 trials: 9.33 sec, Condition 2: Avg of 3 trials: 2.67 sec, Condition 3: Avg of 3 trials: 8.67 sec, Condition 4: Avg of 3 trials: 2.33 sec, and Total Score: 19/120  DGI 1. Gait level surface (2) Mild Impairment: Walks 20', uses assistive devices, slower speed, mild gait deviations. 2. Change in gait speed (2) Mild Impairment: Is able to change speed but demonstrates mild gait deviations, or not gait deviations but unable to achieve a significant change in velocity, or uses an assistive device. 3. Gait with horizontal head turns (1) Moderate Impairment: Performs head turns with moderate change in gait velocity, slows down, staggers but recovers, can continue to walk. 4. Gait with vertical head turns 1) Moderate Impairment: Performs head turns with moderate change in gait velocity, slows down, staggers but recovers, can continue to walk. 5. Gait and pivot turn (2) Mild Impairment: Pivot turns safely in > 3 seconds and stops with no loss of balance. 6. Step over obstacle (2) Mild Impairment: Is able to step over box, but must slow down and adjust steps to clear box safely. 7. Step around obstacles (2) Mild Impairment: Is able to step around both cones, but must slow down and adjust steps to clear cones. 8. Stairs (2) Mild Impairment: Alternating feet, must use rail.  TOTAL SCORE: 14 / 24  PATIENT SURVEYS:  ABC scale 1350/1600 =  84.4%  GASTROCSOLEUS: moderate restriction on B  VESTIBULAR ASSESSMENT:  GENERAL OBSERVATION: ambulatory without assistive device   SYMPTOM BEHAVIOR:  Subjective history: see above  Non-Vestibular symptoms:  none  Type of dizziness: Imbalance (Disequilibrium)  Frequency: everyday  Aggravating factors: Induced by motion: occur when walking and Occurs when standing still   Relieving factors: rest  Progression of symptoms: worse  OCULOMOTOR EXAM:  In sitting:  Ocular Alignment: normal  Ocular ROM: No Limitations  Smooth Pursuits: intact  Saccades: intact  Oculomotor reflex R/L: intact   In standing:  Smooth Pursuits: intact, but presents with mild to moderate unsteadiness  Saccades: intact, but presents with mild to moderate unsteadiness Oculomotor reflex R/L: intact, but presents with mild to moderate unsteadiness  VESTIBULAR - OCULAR REFLEX:   In sitting Slow VOR: Normal  VOR Cancellation: Normal  Head-Impulse Test: HIT Right: negative HIT Left: negative  In standing Slow VOR: Normal, but presents with mild to moderate unsteadiness  VOR Cancellation: Normal, but presents with mild to moderate unsteadiness   POSITIONAL TESTING: Other: not tested  MOTION SENSITIVITY:  Motion Sensitivity Quotient Intensity: 0 = none, 1 = Lightheaded, 2 = Mild, 3 = Moderate, 4 = Severe, 5 = Vomiting  Intensity  1. Sitting to supine   2. Supine to L side   3. Supine to R side   4. Supine to sitting   5. L Hallpike-Dix   6. Up from L    7. R Hallpike-Dix   8. Up from R    9. Sitting, head tipped to L knee 0  10. Head up from L knee 0  11. Sitting, head tipped to R knee 0  12. Head up from R knee 0  13. Sitting head turns x5 0  14.Sitting head nods x5   15. In stance, 180 turn to L  0  16. In stance, 180 turn to R 0                                                                                                        TREATMENT DATE:  04/12/23 Heel raise x 10 Rockerboard x  2 minutes  Toe raise x 10 Tandem stance with head turns  Narrow base of support with eyes closed Marching x 10 Tandem gt x 2 RT Retro gt x 2 RT Side step green theraband x 2  Slant board stretch x 3 x 30" 04/06/23 NuStep, seat 9, level 2, 5' Gastrocnemius slant board stretch x 30" x 3 Sit-to-stand, with Tidal tank log on chest x 10 x 2 Walking with horizontal head turns 80 ft Tidal tank log on chest x 10 x 2 Walking with vertical head turns 80 ft Tidal tank log on chest x 10 x 2 Tandem walking x 20 ft x 2 rounds Body craft resisted walking forward/back/sides x 2 plates x 3 rounds each Chop and lift while on foam x 10 x 2 Forward stepping over cones x 3 rounds Standing on dyna discs while matching colors on the cone x 1'     PATIENT EDUCATION: Education details: Educated on the pathoanatomy of balance and the vestibular system. Educated on the goals and course of rehab. Educated on the measures to reduce risk of falls at home.  HOME EXERCISE PROGRAM: 03/30/23 HOME EXERCISES FOR YOUR DIZZINESS Do this 2-3x/day, 5-7 days/week  GAZE STABILIZATION EXERCISES These exercises are designed to improve your eyes' ability to track objects without getting dizzy. When performing these exercises, make sure you are facing a blank wall (like a white wall) to avoid distraction. You can wear your glasses/contact lenses if you must.   Before anything, warm-up your neck muscles by bending it forward and back 10 times, side-to-side 10 times, and slowly turn your head left and right 10 times.  Exercise 1 (Horizontal Saccades) Use two cards for this exercise on the wall in standing while holding on to a stable surface, look at the first item on the other card then look at the next item on the second card then alternate your gaze sequentially. Do NOT move your cards nor the your head. Do the following for one  minute each: Identifying numbers Reading words Identifying shapes Exercise 2 (Paradigm  x1) Use only one card for this exercise on the wall in standing while holding on to a stable surface, turn your head left and right at your own pace while keeping the card stationary.  Do this for one minute each while you are: Identifying numbers Reading words Identifying shapes  Exercise 4 (VOR cancellation) Use only one card for this exercise While holding the card in sitting with your arms outstretched, turn your body, arm, and head at the same time left and right at your own pace Do this for one minute each while you are: Identifying numbers Reading words Identifying shapes  Access Code: [email or web address] URL: [email or web address] 04/05/2023 - Heel Toe Raises with Counter Support  - 1-2 x daily - 5-7 x weekly - 2 sets - 10 reps  Date: 03/30/2023 Prepared by: Krystal Clark  Exercises - Seated Calf Stretch with Strap  - 1-2 x daily - 5-7 x weekly - 3 reps - 30 hold - Standing Tandem Balance with Counter Support  - 1-2 x daily - 5-7 x weekly - 2 reps - 30 hold Access Code: [email or web address] URL: [email or web address] Date: 04/12/2023 Prepared by: Virgina Organ  Exercises - Corner Balance Feet Together With Eyes Open  - 1 x daily - 7 x weekly - 1 sets - 10 reps - Semi-Tandem Corner Balance With Eyes Open  - 1 x daily - 7 x weekly - 1 sets - 10 reps - Sit to Stand  - 2 x daily - 7 x weekly - 3 sets - 10 reps - Standing Marching  - 2 x daily - 7 x weekly - 3 sets - 10 reps - Seated Ankle Dorsiflexion AROM  - 5 x daily - 7 x weekly - 1 sets - 10 reps - 5" hold GOALS: Goals reviewed with patient? Yes  SHORT TERM GOALS: Target date: 04/11/23  Pt will demonstrate indep in HEP to facilitate carry-over of skilled services and improve functional outcomes Goal status: met  LONG TERM GOALS: Target date: 04/25/23  Pt will improve DGI by at least 3 points in order to demonstrate clinically significant improvement in balance and decreased risk for falls  Baseline:  14 Goal status: IN PROGRESS  2.  Pt will increase by at least 40 ft in order to demonstrate clinically significant improvement in community ambulation Baseline: 457 ft Goal status: IN PROGRESS  3.  Pt will demonstrate an increase in mCTSIB total score by 30 seconds to facilitate ease and safety in ambulation Baseline: 19/120 Goal status: IN PROGRESS   ASSESSMENT:  CLINICAL IMPRESSION: Treatment focused on balance adding more balance activities to HEP.  PT has significant difficulty with narrow base of support with eyes closed.  Skilled PT is required to address the impairments and improve function.  EVAL: Patient is a 83 y.o. female who was seen today for physical therapy evaluation and treatment for balance disorder and gait instability. Patient's condition is further defined by difficulty with walking due to impaired proprioception and vestibular input as well decreased soft tissue extensibility. Skilled PT is required to address the impairments and functional limitations listed below.   OBJECTIVE IMPAIRMENTS: Abnormal gait, decreased activity tolerance, decreased balance, difficulty walking, decreased safety awareness, impaired perceived functional ability, and impaired flexibility.   ACTIVITY LIMITATIONS: carrying, lifting, bending, standing, squatting, and stairs  PARTICIPATION LIMITATIONS: meal prep, cleaning, laundry, shopping, community activity, and yard work  PERSONAL FACTORS: Age, Past/current experiences, and Time  since onset of injury/illness/exacerbation are also affecting patient's functional outcome.   REHAB POTENTIAL: Fair    CLINICAL DECISION MAKING: Stable/uncomplicated  EVALUATION COMPLEXITY: Low   PLAN:  PT FREQUENCY: 2x/week  PT DURATION: 4 weeks  PLANNED INTERVENTIONS: 97164- PT Re-evaluation, 97110-Therapeutic exercises, 97530- Therapeutic activity, [id number]- Neuromuscular re-education, 97535- Self Care, [id number]- Manual therapy, [phone]- Gait training,  Patient/Family education, and Stair training  PLAN FOR NEXT SESSION:Continue POC and may progress as tolerated with emphasis on vestibular activities paired with balance/proprioceptive activities.  Virgina Organ, PT CLT [phone]

## 2023-04-14 ENCOUNTER — Ambulatory Visit (HOSPITAL_COMMUNITY): Payer: Medicare HMO | Admitting: Physical Therapy

## 2023-04-14 DIAGNOSIS — R2689 Other abnormalities of gait and mobility: Secondary | ICD-10-CM

## 2023-04-14 DIAGNOSIS — R262 Difficulty in walking, not elsewhere classified: Secondary | ICD-10-CM

## 2023-04-14 NOTE — Therapy (Signed)
 OUTPATIENT PHYSICAL THERAPY VESTIBULAR TREATMENT     Patient Name: Nancy Williams MRN: [id number] DOB:January 19, 1941, 83 y.o., female Today's Date: 04/14/2023  END OF SESSION:  PT End of Session - 04/14/23 0945     Visit Number 6    Number of Visits 8    Date for PT Re-Evaluation 04/25/23    Authorization Type [employer] HMO/PPO (no auth)    PT Start Time [id number]    PT Stop Time 0934    PT Time Calculation (min) 40 min    Equipment Utilized During Treatment Gait belt    Activity Tolerance Patient tolerated treatment well    Behavior During Therapy WFL for tasks assessed/performed                  Past Medical History:  Diagnosis Date   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    Hypothyroidism    Hashimoto's per [facility] New Patient Packet    Neuropathy    Osteoarthritis    Per patient at New patient appointment    Seasonal allergies    Vitamin D deficiency    Past Surgical History:  Procedure Laterality Date   No previous surgery     Patient Active Problem List   Diagnosis Date Noted   Hyperlipidemia 03/04/2023   Gait abnormality 03/04/2023   Anxiety 03/04/2023   Hypothyroidism     PCP: Tommie Sams, DO REFERRING PROVIDER: Ashok Croon, MD  REFERRING DIAG: R42 (ICD-10-CM) - Dizziness and giddiness R26.89 (ICD-10-CM) - Balance disorder G62.9 (ICD-10-CM) - Neuropathy R26.81 (ICD-10-CM) - Gait instability  THERAPY DIAG:  Balance problem  Difficulty in walking, not elsewhere classified  ONSET DATE: 3 years ago  Rationale for Evaluation and Treatment: Rehabilitation  SUBJECTIVE:   SUBJECTIVE STATEMENT: Pt states she has no difficulty with her HEP  PERTINENT HISTORY: Anxiety, hypothyroidism  PAIN:  Are you having pain? No  PRECAUTIONS: Fall  RED FLAGS: None   WEIGHT BEARING RESTRICTIONS: No  FALLS: Has patient fallen in last 6 months? No  LIVING ENVIRONMENT: Lives with: lives alone Lives in:  House/apartment Stairs: Yes: External: 5 steps; on right going up Has following equipment at home: Grab bars  PLOF: Independent and Independent with basic ADLs  PATIENT GOALS: "hoping to get rid of the balance issue"  OBJECTIVE:  Note: Objective measures were completed at Evaluation unless otherwise noted.  DIAGNOSTIC FINDINGS:  03/20/23 NUCLEAR MEDICINE BRAIN IMAGING WITH SPECT  (DaTscan )     IMPRESSION: Ioflupane scan within normal limits. No reduced radiotracer activity in basal ganglia to suggest Parkinson's syndrome pathology.    POSTURE:  rounded shoulders and forward head  Cervical ROM:    Active A/PROM (deg) eval  Flexion WFL  Extension WFL  Right lateral flexion   Left lateral flexion   Right rotation WFL  Left rotation WFL  (Blank rows = not tested)  LOWER EXTREMITY MMT:   MMT Right eval Left eval  Hip flexion 4+ 4+  Hip abduction 4+ 4+  Hip adduction 4+ 4+  Hip internal rotation    Hip external rotation    Knee flexion 4+ 4+  Knee extension 4+ 4+  Ankle dorsiflexion 4+ 4+  Ankle plantarflexion 4+ 4+  Ankle inversion    Ankle eversion    (Blank rows = not tested)  TRANSFERS: Assistive device utilized: None  Sit to stand: SBA Stand to sit: Complete Independence  GAIT: Gait pattern:  tends to veer to the L, decreased step length- Left, decreased  stance time- Left, decreased hip/knee flexion- Left, Left steppage, and poor foot clearance- Left Distance walked: 457 ft Assistive device utilized: None Level of assistance: CGA Comments: done during  FUNCTIONAL TESTS:  5 times sit to stand: 8.83 sec 2 minute walk test: 457 ft Dynamic Gait Index: 14 MCTSIB: Condition 1: Avg of 3 trials: 9.33 sec, Condition 2: Avg of 3 trials: 2.67 sec, Condition 3: Avg of 3 trials: 8.67 sec, Condition 4: Avg of 3 trials: 2.33 sec, and Total Score: 19/120  DGI 1. Gait level surface (2) Mild Impairment: Walks 20', uses assistive devices, slower speed, mild  gait deviations. 2. Change in gait speed (2) Mild Impairment: Is able to change speed but demonstrates mild gait deviations, or not gait deviations but unable to achieve a significant change in velocity, or uses an assistive device. 3. Gait with horizontal head turns (1) Moderate Impairment: Performs head turns with moderate change in gait velocity, slows down, staggers but recovers, can continue to walk. 4. Gait with vertical head turns 1) Moderate Impairment: Performs head turns with moderate change in gait velocity, slows down, staggers but recovers, can continue to walk. 5. Gait and pivot turn (2) Mild Impairment: Pivot turns safely in > 3 seconds and stops with no loss of balance. 6. Step over obstacle (2) Mild Impairment: Is able to step over box, but must slow down and adjust steps to clear box safely. 7. Step around obstacles (2) Mild Impairment: Is able to step around both cones, but must slow down and adjust steps to clear cones. 8. Stairs (2) Mild Impairment: Alternating feet, must use rail.  TOTAL SCORE: 14 / 24  PATIENT SURVEYS:  ABC scale 1350/1600 = 84.4%  GASTROCSOLEUS: moderate restriction on B  VESTIBULAR ASSESSMENT:  GENERAL OBSERVATION: ambulatory without assistive device   SYMPTOM BEHAVIOR:  Subjective history: see above  Non-Vestibular symptoms:  none  Type of dizziness: Imbalance (Disequilibrium)  Frequency: everyday  Aggravating factors: Induced by motion: occur when walking and Occurs when standing still   Relieving factors: rest  Progression of symptoms: worse  OCULOMOTOR EXAM:  In sitting:  Ocular Alignment: normal  Ocular ROM: No Limitations  Smooth Pursuits: intact  Saccades: intact  Oculomotor reflex R/L: intact   In standing:  Smooth Pursuits: intact, but presents with mild to moderate unsteadiness  Saccades: intact, but presents with mild to moderate unsteadiness Oculomotor reflex R/L: intact, but presents with mild to moderate  unsteadiness  VESTIBULAR - OCULAR REFLEX:   In sitting Slow VOR: Normal  VOR Cancellation: Normal  Head-Impulse Test: HIT Right: negative HIT Left: negative  In standing Slow VOR: Normal, but presents with mild to moderate unsteadiness  VOR Cancellation: Normal, but presents with mild to moderate unsteadiness   POSITIONAL TESTING: Other: not tested  MOTION SENSITIVITY:  Motion Sensitivity Quotient Intensity: 0 = none, 1 = Lightheaded, 2 = Mild, 3 = Moderate, 4 = Severe, 5 = Vomiting  Intensity  1. Sitting to supine   2. Supine to L side   3. Supine to R side   4. Supine to sitting   5. L Hallpike-Dix   6. Up from L    7. R Hallpike-Dix   8. Up from R    9. Sitting, head tipped to L knee 0  10. Head up from L knee 0  11. Sitting, head tipped to R knee 0  12. Head up from R knee 0  13. Sitting head turns x5 0  14.Sitting head nods x5  15. In stance, 180 turn to L  0  16. In stance, 180 turn to R 0                                                                                                        TREATMENT DATE:  04/14/23 Heel raise x 10  Stand on foam tandem with head turns x 10 Stand of foam eyes closed 30" x 2  Tandem gait on foam x 2 RT Tandem gain on foam with head turns x 2 Rt  Retrogait on foam x 2 RT Walking throwing scarves up and catching x 2RT  Tandem gt over hurdles; (pt has no difficulty with this task) Marching x 10 Standing shifting wt as far as possible and hold for 4 seconds forward, back, Rt and Lt x 3 each   04/12/23 Heel raise x 10 Rockerboard x 2 minutes  Toe raise x 10 Tandem stance with head turns  Narrow base of support with eyes closed Marching x 10 Tandem gt x 2 RT Retro gt x 2 RT Side step green theraband x 2  Slant board stretch x 3 x 30" 04/06/23 NuStep, seat 9, level 2, 5' Gastrocnemius slant board stretch x 30" x 3 Sit-to-stand, with Tidal tank log on chest x 10 x 2 Walking with horizontal head turns 80 ft Tidal tank log  on chest x 10 x 2 Walking with vertical head turns 80 ft Tidal tank log on chest x 10 x 2 Tandem walking x 20 ft x 2 rounds Body craft resisted walking forward/back/sides x 2 plates x 3 rounds each Chop and lift while on foam x 10 x 2 Forward stepping over cones x 3 rounds Standing on dyna discs while matching colors on the cone x 1'     PATIENT EDUCATION: Education details: Educated on the pathoanatomy of balance and the vestibular system. Educated on the goals and course of rehab. Educated on the measures to reduce risk of falls at home.  HOME EXERCISE PROGRAM: 03/30/23 HOME EXERCISES FOR YOUR DIZZINESS Do this 2-3x/day, 5-7 days/week  GAZE STABILIZATION EXERCISES These exercises are designed to improve your eyes' ability to track objects without getting dizzy. When performing these exercises, make sure you are facing a blank wall (like a white wall) to avoid distraction. You can wear your glasses/contact lenses if you must.   Before anything, warm-up your neck muscles by bending it forward and back 10 times, side-to-side 10 times, and slowly turn your head left and right 10 times.  Exercise 1 (Horizontal Saccades) Use two cards for this exercise on the wall in standing while holding on to a stable surface, look at the first item on the other card then look at the next item on the second card then alternate your gaze sequentially. Do NOT move your cards nor the your head. Do the following for one minute each: Identifying numbers Reading words Identifying shapes Exercise 2 (Paradigm x1) Use only one card for this exercise on the wall in standing while holding on to a stable surface, turn  your head left and right at your own pace while keeping the card stationary.  Do this for one minute each while you are: Identifying numbers Reading words Identifying shapes  Exercise 4 (VOR cancellation) Use only one card for this exercise While holding the card in sitting with your arms  outstretched, turn your body, arm, and head at the same time left and right at your own pace Do this for one minute each while you are: Identifying numbers Reading words Identifying shapes  Access Code: [email or web address] URL: [email or web address] 04/05/2023 - Heel Toe Raises with Counter Support  - 1-2 x daily - 5-7 x weekly - 2 sets - 10 reps  Date: 03/30/2023 Prepared by: Krystal Clark  Exercises - Seated Calf Stretch with Strap  - 1-2 x daily - 5-7 x weekly - 3 reps - 30 hold - Standing Tandem Balance with Counter Support  - 1-2 x daily - 5-7 x weekly - 2 reps - 30 hold Access Code: [email or web address] URL: [email or web address] Date: 04/12/2023 Prepared by: Virgina Organ  Exercises - Corner Balance Feet Together With Eyes Open  - 1 x daily - 7 x weekly - 1 sets - 10 reps - Semi-Tandem Corner Balance With Eyes Open  - 1 x daily - 7 x weekly - 1 sets - 10 reps - Sit to Stand  - 2 x daily - 7 x weekly - 3 sets - 10 reps - Standing Marching  - 2 x daily - 7 x weekly - 3 sets - 10 reps - Seated Ankle Dorsiflexion AROM  - 5 x daily - 7 x weekly - 1 sets - 10 reps - 5" hold GOALS: Goals reviewed with patient? Yes  SHORT TERM GOALS: Target date: 04/11/23  Pt will demonstrate indep in HEP to facilitate carry-over of skilled services and improve functional outcomes Goal status: met  LONG TERM GOALS: Target date: 04/25/23  Pt will improve DGI by at least 3 points in order to demonstrate clinically significant improvement in balance and decreased risk for falls  Baseline: 14 Goal status: IN PROGRESS  2.  Pt will increase by at least 40 ft in order to demonstrate clinically significant improvement in community ambulation Baseline: 457 ft Goal status: IN PROGRESS  3.  Pt will demonstrate an increase in mCTSIB total score by 30 seconds to facilitate ease and safety in ambulation Baseline: 19/120 Goal status: IN PROGRESS   ASSESSMENT:  CLINICAL  IMPRESSION: Treatment continues to focus on balance.  Added foam to tandem gait as well as gait with scarf throwing.  Pt continues to put majority of wt through her Lt LE and relies on her Y ligament rather than her abdominal strength when standing.  Therapist brought this to pt attention and requested that she works on improving posture over the weekend.    Skilled PT is required to address the impairments and improve function.   OBJECTIVE IMPAIRMENTS: Abnormal gait, decreased activity tolerance, decreased balance, difficulty walking, decreased safety awareness, impaired perceived functional ability, and impaired flexibility.   ACTIVITY LIMITATIONS: carrying, lifting, bending, standing, squatting, and stairs  PARTICIPATION LIMITATIONS: meal prep, cleaning, laundry, shopping, community activity, and yard work  PERSONAL FACTORS: Age, Past/current experiences, and Time since onset of injury/illness/exacerbation are also affecting patient's functional outcome.   REHAB POTENTIAL: Fair    CLINICAL DECISION MAKING: Stable/uncomplicated  EVALUATION COMPLEXITY: Low   PLAN:  PT FREQUENCY: 2x/week  PT DURATION: 4 weeks  PLANNED INTERVENTIONS: 97164- PT Re-evaluation, 97110-Therapeutic exercises, 97530- Therapeutic activity, 97112-  Neuromuscular re-education, [phone]- Self Care, 60454- Manual therapy, [phone]- Gait training, Patient/Family education, and Stair training  PLAN FOR NEXT SESSION:Continue POC and may progress as tolerated with emphasis on vestibular activities paired with balance/proprioceptive activities. Reassess visit 8 next week.  Virgina Organ, PT CLT [phone]

## 2023-04-18 ENCOUNTER — Ambulatory Visit (HOSPITAL_COMMUNITY): Payer: Medicare HMO | Admitting: Physical Therapy

## 2023-04-18 ENCOUNTER — Ambulatory Visit: Admitting: Family Medicine

## 2023-04-18 VITALS — BP 117/75 | HR 71 | Temp 98.1°F | Ht 66.0 in | Wt 134.4 lb

## 2023-04-18 DIAGNOSIS — R269 Unspecified abnormalities of gait and mobility: Secondary | ICD-10-CM

## 2023-04-18 DIAGNOSIS — F419 Anxiety disorder, unspecified: Secondary | ICD-10-CM | POA: Diagnosis not present

## 2023-04-18 DIAGNOSIS — R262 Difficulty in walking, not elsewhere classified: Secondary | ICD-10-CM

## 2023-04-18 DIAGNOSIS — R2689 Other abnormalities of gait and mobility: Secondary | ICD-10-CM

## 2023-04-18 NOTE — Therapy (Signed)
 OUTPATIENT PHYSICAL THERAPY VESTIBULAR TREATMENT     Patient Name: Nancy Williams MRN: [id number] DOB:1940/05/28, 83 y.o., female Today's Date: 04/18/2023  END OF SESSION:  PT End of Session - 04/18/23 1010     Visit Number 7    Number of Visits 8    Date for PT Re-Evaluation 04/25/23    Authorization Type [employer] HMO/PPO (no auth)    PT Start Time [phone]    PT Stop Time 1010    PT Time Calculation (min) 42 min    Equipment Utilized During Treatment Gait belt    Activity Tolerance Patient tolerated treatment well    Behavior During Therapy WFL for tasks assessed/performed                   Past Medical History:  Diagnosis Date   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    Hypothyroidism    Hashimoto's per [facility] New Patient Packet    Neuropathy    Osteoarthritis    Per patient at New patient appointment    Seasonal allergies    Vitamin D deficiency    Past Surgical History:  Procedure Laterality Date   No previous surgery     Patient Active Problem List   Diagnosis Date Noted   Hyperlipidemia 03/04/2023   Gait abnormality 03/04/2023   Anxiety 03/04/2023   Hypothyroidism     PCP: Tommie Sams, DO REFERRING PROVIDER: Ashok Croon, MD  REFERRING DIAG: R42 (ICD-10-CM) - Dizziness and giddiness R26.89 (ICD-10-CM) - Balance disorder G62.9 (ICD-10-CM) - Neuropathy R26.81 (ICD-10-CM) - Gait instability  THERAPY DIAG:  Balance problem  Difficulty in walking, not elsewhere classified  ONSET DATE: 3 years ago  Rationale for Evaluation and Treatment: Rehabilitation  SUBJECTIVE:   SUBJECTIVE STATEMENT: Pt states she has no difficulty with her HEP  PERTINENT HISTORY: Anxiety, hypothyroidism  PAIN:  Are you having pain? No  PRECAUTIONS: Fall  RED FLAGS: None   WEIGHT BEARING RESTRICTIONS: No  FALLS: Has patient fallen in last 6 months? No  LIVING ENVIRONMENT: Lives with: lives alone Lives in:  House/apartment Stairs: Yes: External: 5 steps; on right going up Has following equipment at home: Grab bars  PLOF: Independent and Independent with basic ADLs  PATIENT GOALS: "hoping to get rid of the balance issue"  OBJECTIVE:  Note: Objective measures were completed at Evaluation unless otherwise noted.  DIAGNOSTIC FINDINGS:  03/20/23 NUCLEAR MEDICINE BRAIN IMAGING WITH SPECT  (DaTscan )     IMPRESSION: Ioflupane scan within normal limits. No reduced radiotracer activity in basal ganglia to suggest Parkinson's syndrome pathology.    POSTURE:  rounded shoulders and forward head  Cervical ROM:    Active A/PROM (deg) eval  Flexion WFL  Extension WFL  Right lateral flexion   Left lateral flexion   Right rotation WFL  Left rotation WFL  (Blank rows = not tested)  LOWER EXTREMITY MMT:   MMT Right eval Left eval  Hip flexion 4+ 4+  Hip abduction 4+ 4+  Hip adduction 4+ 4+  Hip internal rotation    Hip external rotation    Knee flexion 4+ 4+  Knee extension 4+ 4+  Ankle dorsiflexion 4+ 4+  Ankle plantarflexion 4+ 4+  Ankle inversion    Ankle eversion    (Blank rows = not tested)  TRANSFERS: Assistive device utilized: None  Sit to stand: SBA Stand to sit: Complete Independence  GAIT: Gait pattern:  tends to veer to the L, decreased step length- Left,  decreased stance time- Left, decreased hip/knee flexion- Left, Left steppage, and poor foot clearance- Left Distance walked: 457 ft Assistive device utilized: None Level of assistance: CGA Comments: done during  FUNCTIONAL TESTS:  5 times sit to stand: 8.83 sec 2 minute walk test: 457 ft Dynamic Gait Index: 14 MCTSIB: Condition 1: Avg of 3 trials: 9.33 sec, Condition 2: Avg of 3 trials: 2.67 sec, Condition 3: Avg of 3 trials: 8.67 sec, Condition 4: Avg of 3 trials: 2.33 sec, and Total Score: 19/120  DGI 1. Gait level surface (2) Mild Impairment: Walks 20', uses assistive devices, slower speed, mild  gait deviations. 2. Change in gait speed (2) Mild Impairment: Is able to change speed but demonstrates mild gait deviations, or not gait deviations but unable to achieve a significant change in velocity, or uses an assistive device. 3. Gait with horizontal head turns (1) Moderate Impairment: Performs head turns with moderate change in gait velocity, slows down, staggers but recovers, can continue to walk. 4. Gait with vertical head turns 1) Moderate Impairment: Performs head turns with moderate change in gait velocity, slows down, staggers but recovers, can continue to walk. 5. Gait and pivot turn (2) Mild Impairment: Pivot turns safely in > 3 seconds and stops with no loss of balance. 6. Step over obstacle (2) Mild Impairment: Is able to step over box, but must slow down and adjust steps to clear box safely. 7. Step around obstacles (2) Mild Impairment: Is able to step around both cones, but must slow down and adjust steps to clear cones. 8. Stairs (2) Mild Impairment: Alternating feet, must use rail.  TOTAL SCORE: 14 / 24  PATIENT SURVEYS:  ABC scale 1350/1600 = 84.4%  GASTROCSOLEUS: moderate restriction on B  VESTIBULAR ASSESSMENT:  GENERAL OBSERVATION: ambulatory without assistive device   SYMPTOM BEHAVIOR:  Subjective history: see above  Non-Vestibular symptoms:  none  Type of dizziness: Imbalance (Disequilibrium)  Frequency: everyday  Aggravating factors: Induced by motion: occur when walking and Occurs when standing still   Relieving factors: rest  Progression of symptoms: worse  OCULOMOTOR EXAM:  In sitting:  Ocular Alignment: normal  Ocular ROM: No Limitations  Smooth Pursuits: intact  Saccades: intact  Oculomotor reflex R/L: intact   In standing:  Smooth Pursuits: intact, but presents with mild to moderate unsteadiness  Saccades: intact, but presents with mild to moderate unsteadiness Oculomotor reflex R/L: intact, but presents with mild to moderate  unsteadiness  VESTIBULAR - OCULAR REFLEX:   In sitting Slow VOR: Normal  VOR Cancellation: Normal  Head-Impulse Test: HIT Right: negative HIT Left: negative  In standing Slow VOR: Normal, but presents with mild to moderate unsteadiness  VOR Cancellation: Normal, but presents with mild to moderate unsteadiness   POSITIONAL TESTING: Other: not tested  MOTION SENSITIVITY:  Motion Sensitivity Quotient Intensity: 0 = none, 1 = Lightheaded, 2 = Mild, 3 = Moderate, 4 = Severe, 5 = Vomiting  Intensity  1. Sitting to supine   2. Supine to L side   3. Supine to R side   4. Supine to sitting   5. L Hallpike-Dix   6. Up from L    7. R Hallpike-Dix   8. Up from R    9. Sitting, head tipped to L knee 0  10. Head up from L knee 0  11. Sitting, head tipped to R knee 0  12. Head up from R knee 0  13. Sitting head turns x5 0  14.Sitting head nods  x5   15. In stance, 180 turn to L  0  16. In stance, 180 turn to R 0                                                                                                        TREATMENT DATE:  04/18/23 Vector stances x 10" 3 x each  All four opposite arm/leg x 10 with Rt UE modified to just unweighting Obstacle course:  wobble board throw ball up x 5; balance beam walk over hurdles, weave cones x 2 Sit to stand x 15   Standing semitandem with full head rotation x 5 B Standing tandem head nods -no difficulty Walking with scarf throwing; 2 scarves x 2 RT  Side stepping over 6" hurdles quickly  Cone tapping   04/14/23 Heel raise x 10  Stand on foam tandem with head turns x 10 Stand of foam eyes closed 30" x 2  Tandem gait on foam x 2 RT Tandem gain on foam with head turns x 2 Rt  Retrogait on foam x 2 RT Walking throwing scarves up and catching x 2RT  Tandem gt over hurdles; (pt has no difficulty with this task) Marching x 10 Standing shifting wt as far as possible and hold for 4 seconds forward, back, Rt and Lt x 3 each    PATIENT  EDUCATION: Education details: Educated on the pathoanatomy of balance and the vestibular system. Educated on the goals and course of rehab. Educated on the measures to reduce risk of falls at home.  HOME EXERCISE PROGRAM: 03/30/23 HOME EXERCISES FOR YOUR DIZZINESS Do this 2-3x/day, 5-7 days/week  GAZE STABILIZATION EXERCISES These exercises are designed to improve your eyes' ability to track objects without getting dizzy. When performing these exercises, make sure you are facing a blank wall (like a white wall) to avoid distraction. You can wear your glasses/contact lenses if you must.   Before anything, warm-up your neck muscles by bending it forward and back 10 times, side-to-side 10 times, and slowly turn your head left and right 10 times.  Exercise 1 (Horizontal Saccades) Use two cards for this exercise on the wall in standing while holding on to a stable surface, look at the first item on the other card then look at the next item on the second card then alternate your gaze sequentially. Do NOT move your cards nor the your head. Do the following for one minute each: Identifying numbers Reading words Identifying shapes Exercise 2 (Paradigm x1) Use only one card for this exercise on the wall in standing while holding on to a stable surface, turn your head left and right at your own pace while keeping the card stationary.  Do this for one minute each while you are: Identifying numbers Reading words Identifying shapes  Exercise 4 (VOR cancellation) Use only one card for this exercise While holding the card in sitting with your arms outstretched, turn your body, arm, and head at the same time left and right at your own pace Do this for one minute each while you  are: Identifying numbers Reading words Identifying shapes  Access Code: [email or web address] URL: [email or web address] 04/05/2023 - Heel Toe Raises with Counter Support  - 1-2 x daily - 5-7 x weekly - 2 sets - 10  reps  Date: 03/30/2023 Prepared by: Krystal Clark  Exercises - Seated Calf Stretch with Strap  - 1-2 x daily - 5-7 x weekly - 3 reps - 30 hold - Standing Tandem Balance with Counter Support  - 1-2 x daily - 5-7 x weekly - 2 reps - 30 hold Access Code: [email or web address] URL: [email or web address] Date: 04/12/2023 Prepared by: Virgina Organ  Exercises - Corner Balance Feet Together With Eyes Open  - 1 x daily - 7 x weekly - 1 sets - 10 reps - Semi-Tandem Corner Balance With Eyes Open  - 1 x daily - 7 x weekly - 1 sets - 10 reps - Sit to Stand  - 2 x daily - 7 x weekly - 3 sets - 10 reps - Standing Marching  - 2 x daily - 7 x weekly - 3 sets - 10 reps - Seated Ankle Dorsiflexion AROM  - 5 x daily - 7 x weekly - 1 sets - 10 reps - 5" hold GOALS: Goals reviewed with patient? Yes  SHORT TERM GOALS: Target date: 04/11/23  Pt will demonstrate indep in HEP to facilitate carry-over of skilled services and improve functional outcomes Goal status: met  LONG TERM GOALS: Target date: 04/25/23  Pt will improve DGI by at least 3 points in order to demonstrate clinically significant improvement in balance and decreased risk for falls  Baseline: 14 Goal status: IN PROGRESS  2.  Pt will increase by at least 40 ft in order to demonstrate clinically significant improvement in community ambulation Baseline: 457 ft Goal status: IN PROGRESS  3.  Pt will demonstrate an increase in mCTSIB total score by 30 seconds to facilitate ease and safety in ambulation Baseline: 19/120 Goal status: IN PROGRESS   ASSESSMENT:  CLINICAL IMPRESSION: Pt with noted improved awareness of posture.  Pt able to complete head turns with improved balance although this is still difficult for pt.  Head nods are significantly improved.  Pt has significant difficulty standing on wobble boards.     Skilled PT is required to address the impairments and improve function.   OBJECTIVE IMPAIRMENTS: Abnormal gait,  decreased activity tolerance, decreased balance, difficulty walking, decreased safety awareness, impaired perceived functional ability, and impaired flexibility.   ACTIVITY LIMITATIONS: carrying, lifting, bending, standing, squatting, and stairs  PARTICIPATION LIMITATIONS: meal prep, cleaning, laundry, shopping, community activity, and yard work  PERSONAL FACTORS: Age, Past/current experiences, and Time since onset of injury/illness/exacerbation are also affecting patient's functional outcome.   REHAB POTENTIAL: Fair    CLINICAL DECISION MAKING: Stable/uncomplicated  EVALUATION COMPLEXITY: Low   PLAN:  PT FREQUENCY: 2x/week  PT DURATION: 4 weeks  PLANNED INTERVENTIONS: 36644- PT Re-evaluation, 97110-Therapeutic exercises, 97530- Therapeutic activity, [id number]- Neuromuscular re-education, 97535- Self Care, [id number]- Manual therapy, [id number]- Gait training, Patient/Family education, and Stair training  PLAN FOR NEXT SESSION: Zigmund Daniel, PT CLT [phone]

## 2023-04-18 NOTE — Patient Instructions (Signed)
 Zoloft 50 mg daily.  Follow up in 6 weeks.

## 2023-04-18 NOTE — Progress Notes (Unsigned)
   Subjective:  Patient ID: Nancy Williams, female    DOB: August 14, 1940  Age: 83 y.o. MRN: [id number]  CC:  Follow up   HPI:  83 year old female presents for follow-up.  Patient has had an improvement in her anxiety/inner restlessness with Zoloft but he is concerned about continuing this medication.  Will discuss this today.  Patient has had significant improvement with vestibular rehab regarding her gait.  She is happy with her progress as is her daughter.  Patient Active Problem List   Diagnosis Date Noted   Hyperlipidemia 03/04/2023   Gait abnormality 03/04/2023   Anxiety 03/04/2023   Hypothyroidism     Review of Systems Per HPI  Objective:  BP 117/75   Pulse 71   Temp 98.1 F (36.7 C)   Ht 5' (1.524 m)   Wt 134 lb 6.4 oz (61 kg)   SpO2 97%   BMI 26.25 kg/m      04/18/2023    1:56 PM 03/10/2023   11:12 AM 03/02/2023    2:10 PM  BP/Weight  Systolic BP 117 143 108  Diastolic BP 75 72 68  Wt. (Lbs) 134.4  135  BMI 26.25 kg/m2  26.37 kg/m2    Physical Exam Vitals and nursing note reviewed.  Constitutional:      General: She is not in acute distress. HENT:     Head: Normocephalic and atraumatic.  Eyes:     General:        Right eye: No discharge.        Left eye: No discharge.     Conjunctiva/sclera: Conjunctivae normal.  Pulmonary:     Effort: Pulmonary effort is normal. No respiratory distress.  Neurological:     Mental Status: She is alert. Mental status is at baseline.  Psychiatric:        Mood and Affect: Mood normal.        Behavior: Behavior normal.     Lab Results  Component Value Date   WBC 8.4 03/02/2023   HGB 14.4 03/02/2023   HCT 43.5 03/02/2023   PLT 293 03/02/2023   GLUCOSE 77 03/02/2023   CHOL 243 (H) 03/02/2023   TRIG 142 03/02/2023   HDL 94 03/02/2023   LDLCALC 125 (H) 03/02/2023   ALT 15 03/02/2023   AST 25 03/02/2023   NA 142 03/02/2023   K 4.9 03/02/2023   CL 101 03/02/2023   CREATININE 0.77 03/02/2023   BUN 22 03/02/2023    CO2 23 03/02/2023   TSH 4.480 03/02/2023   HGBA1C 5.5 10/27/2021     Assessment & Plan:  Anxiety Assessment & Plan: Increasing Zoloft to 50 mg.  Follow-up in 6 weeks.   Gait abnormality Assessment & Plan: Improving with vestibular rehab.    Follow-up:  6 weeks.  Everlene Other DO [facility]

## 2023-04-19 NOTE — Assessment & Plan Note (Signed)
 Increasing Zoloft to 50 mg.  Follow-up in 6 weeks.

## 2023-04-19 NOTE — Assessment & Plan Note (Signed)
 Improving with vestibular rehab.

## 2023-04-20 ENCOUNTER — Ambulatory Visit (HOSPITAL_COMMUNITY): Payer: Medicare HMO

## 2023-04-20 DIAGNOSIS — R262 Difficulty in walking, not elsewhere classified: Secondary | ICD-10-CM | POA: Diagnosis not present

## 2023-04-20 DIAGNOSIS — R2689 Other abnormalities of gait and mobility: Secondary | ICD-10-CM

## 2023-04-20 NOTE — Therapy (Signed)
 OUTPATIENT PHYSICAL THERAPY VESTIBULAR TREATMENT/PROGRESS NOTE/DISCHARGE Progress Note Reporting Period 03/28/23 to 04/20/23  See note below for Objective Data and Assessment of Progress/Goals.   PHYSICAL THERAPY DISCHARGE SUMMARY  Visits from Start of Care: 8  Current functional level related to goals / functional outcomes: See below   Remaining deficits: See below   Education / Equipment: HEP   Patient agrees to discharge. Patient goals were partially met. Patient is being discharged due to being pleased with the current functional level.         Patient Name: Nancy Williams MRN: [id number] DOB:[birth year], 83 y.o., female Today's Date: 04/20/2023  END OF SESSION:  PT End of Session - 04/20/23 1142     Visit Number 8    Number of Visits 8    Date for PT Re-Evaluation 04/25/23    Authorization Type [employer] HMO/PPO (no auth)    PT Start Time 1143    PT Stop Time 1223    PT Time Calculation (min) 40 min    Equipment Utilized During Treatment Gait belt    Activity Tolerance Patient tolerated treatment well    Behavior During Therapy WFL for tasks assessed/performed                   Past Medical History:  Diagnosis Date   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    Hypothyroidism    Hashimoto's per [facility] New Patient Packet    Neuropathy    Osteoarthritis    Per patient at New patient appointment    Seasonal allergies    Vitamin D deficiency    Past Surgical History:  Procedure Laterality Date   No previous surgery     Patient Active Problem List   Diagnosis Date Noted   Hyperlipidemia 03/04/2023   Gait abnormality 03/04/2023   Anxiety 03/04/2023   Hypothyroidism     PCP: Tommie Sams, DO REFERRING PROVIDER: Ashok Croon, MD  REFERRING DIAG: R42 (ICD-10-CM) - Dizziness and giddiness R26.89 (ICD-10-CM) - Balance disorder G62.9 (ICD-10-CM) - Neuropathy R26.81 (ICD-10-CM) - Gait instability  THERAPY DIAG:   Balance problem  Difficulty in walking, not elsewhere classified  ONSET DATE: 3 years ago  Rationale for Evaluation and Treatment: Rehabilitation  SUBJECTIVE:   SUBJECTIVE STATEMENT: Patient feels she is better; she does not feel she will every be 100% but is independent with her exercises and thinks she is walking better.     PERTINENT HISTORY: Anxiety, hypothyroidism  PAIN:  Are you having pain? No  PRECAUTIONS: Fall  RED FLAGS: None   WEIGHT BEARING RESTRICTIONS: No  FALLS: Has patient fallen in last 6 months? No  LIVING ENVIRONMENT: Lives with: lives alone Lives in: House/apartment Stairs: Yes: External: 5 steps; on right going up Has following equipment at home: Grab bars  PLOF: Independent and Independent with basic ADLs  PATIENT GOALS: "hoping to get rid of the balance issue"  OBJECTIVE:  Note: Objective measures were completed at Evaluation unless otherwise noted.  DIAGNOSTIC FINDINGS:  03/20/23 NUCLEAR MEDICINE BRAIN IMAGING WITH SPECT  (DaTscan )     IMPRESSION: Ioflupane scan within normal limits. No reduced radiotracer activity in basal ganglia to suggest Parkinson's syndrome pathology.    POSTURE:  rounded shoulders and forward head  Cervical ROM:    Active A/PROM (deg) eval  Flexion WFL  Extension WFL  Right lateral flexion   Left lateral flexion   Right rotation WFL  Left rotation WFL  (Blank rows = not tested)  LOWER EXTREMITY MMT:   MMT Right eval Left eval  Hip flexion 4+ 4+  Hip abduction 4+ 4+  Hip adduction 4+ 4+  Hip internal rotation    Hip external rotation    Knee flexion 4+ 4+  Knee extension 4+ 4+  Ankle dorsiflexion 4+ 4+  Ankle plantarflexion 4+ 4+  Ankle inversion    Ankle eversion    (Blank rows = not tested)  TRANSFERS: Assistive device utilized: None  Sit to stand: SBA Stand to sit: Complete Independence  GAIT: Gait pattern:  tends to veer to the L, decreased step length- Left, decreased stance  time- Left, decreased hip/knee flexion- Left, Left steppage, and poor foot clearance- Left Distance walked: 457 ft Assistive device utilized: None Level of assistance: CGA Comments: done during  FUNCTIONAL TESTS:  5 times sit to stand: 8.83 sec 2 minute walk test: 457 ft Dynamic Gait Index: 14 MCTSIB: Condition 1: Avg of 3 trials: 9.33 sec, Condition 2: Avg of 3 trials: 2.67 sec, Condition 3: Avg of 3 trials: 8.67 sec, Condition 4: Avg of 3 trials: 2.33 sec, and Total Score: 19/120  DGI 1. Gait level surface (2) Mild Impairment: Walks 20', uses assistive devices, slower speed, mild gait deviations. 2. Change in gait speed (2) Mild Impairment: Is able to change speed but demonstrates mild gait deviations, or not gait deviations but unable to achieve a significant change in velocity, or uses an assistive device. 3. Gait with horizontal head turns (1) Moderate Impairment: Performs head turns with moderate change in gait velocity, slows down, staggers but recovers, can continue to walk. 4. Gait with vertical head turns 1) Moderate Impairment: Performs head turns with moderate change in gait velocity, slows down, staggers but recovers, can continue to walk. 5. Gait and pivot turn (2) Mild Impairment: Pivot turns safely in > 3 seconds and stops with no loss of balance. 6. Step over obstacle (2) Mild Impairment: Is able to step over box, but must slow down and adjust steps to clear box safely. 7. Step around obstacles (2) Mild Impairment: Is able to step around both cones, but must slow down and adjust steps to clear cones. 8. Stairs (2) Mild Impairment: Alternating feet, must use rail.  TOTAL SCORE: 14 / 24  PATIENT SURVEYS:  ABC scale 1350/1600 = 84.4%  GASTROCSOLEUS: moderate restriction on B  VESTIBULAR ASSESSMENT:  GENERAL OBSERVATION: ambulatory without assistive device   SYMPTOM BEHAVIOR:  Subjective history: see above  Non-Vestibular symptoms:  none  Type of  dizziness: Imbalance (Disequilibrium)  Frequency: everyday  Aggravating factors: Induced by motion: occur when walking and Occurs when standing still   Relieving factors: rest  Progression of symptoms: worse  OCULOMOTOR EXAM:  In sitting:  Ocular Alignment: normal  Ocular ROM: No Limitations  Smooth Pursuits: intact  Saccades: intact  Oculomotor reflex R/L: intact   In standing:  Smooth Pursuits: intact, but presents with mild to moderate unsteadiness  Saccades: intact, but presents with mild to moderate unsteadiness Oculomotor reflex R/L: intact, but presents with mild to moderate unsteadiness  VESTIBULAR - OCULAR REFLEX:   In sitting Slow VOR: Normal  VOR Cancellation: Normal  Head-Impulse Test: HIT Right: negative HIT Left: negative  In standing Slow VOR: Normal, but presents with mild to moderate unsteadiness  VOR Cancellation: Normal, but presents with mild to moderate unsteadiness   POSITIONAL TESTING: Other: not tested  MOTION SENSITIVITY:  Motion Sensitivity Quotient Intensity: 0 = none, 1 = Lightheaded, 2 = Mild, 3 =  Moderate, 4 = Severe, 5 = Vomiting  Intensity  1. Sitting to supine   2. Supine to L side   3. Supine to R side   4. Supine to sitting   5. L Hallpike-Dix   6. Up from L    7. R Hallpike-Dix   8. Up from R    9. Sitting, head tipped to L knee 0  10. Head up from L knee 0  11. Sitting, head tipped to R knee 0  12. Head up from R knee 0  13. Sitting head turns x5 0  14.Sitting head nods x5   15. In stance, 180 turn to L  0  16. In stance, 180 turn to R 0                                                                                                        TREATMENT DATE:  04/20/23 Progress note ABC  88.8% 2 MWT 488 ft 5 times sit to stand 9.46 sec DGI 1. Gait level surface (3) Normal: Walks 20', no assistive devices, good sped, no evidence for imbalance, normal gait pattern 2. Change in gait speed (3) Normal: Able to smoothly change  walking speed without loss of balance or gait deviation. Shows a significant difference in walking speeds between normal, fast and slow speeds. 3. Gait with horizontal head turns (2) Mild Impairment: Performs head turns smoothly with slight change in gait velocity, i.e., minor disruption to smooth gait path or uses walking aid. 4. Gait with vertical head turns (2) Mild Impairment: Performs head turns smoothly with slight change in gait velocity, i.e., minor disruption to smooth gait path or uses walking aid. 5. Gait and pivot turn (2) Mild Impairment: Pivot turns safely in > 3 seconds and stops with no loss of balance. 6. Step over obstacle (3) Normal: Is able to step over the box without changing gait speed, no evidence of imbalance. 7. Step around obstacles (3) Normal: Is able to walk around cones safely without changing gait speed; no evidence of imbalance. 8. Stairs (2) Mild Impairment: Alternating feet, must use rail.  TOTAL SCORE: 20 / 24   04/18/23 Vector stances x 10" 3 x each  All four opposite arm/leg x 10 with Rt UE modified to just unweighting Obstacle course:  wobble board throw ball up x 5; balance beam walk over hurdles, weave cones x 2 Sit to stand x 15   Standing semitandem with full head rotation x 5 B Standing tandem head nods -no difficulty Walking with scarf throwing; 2 scarves x 2 RT  Side stepping over 6" hurdles quickly  Cone tapping   04/14/23 Heel raise x 10  Stand on foam tandem with head turns x 10 Stand of foam eyes closed 30" x 2  Tandem gait on foam x 2 RT Tandem gain on foam with head turns x 2 Rt  Retrogait on foam x 2 RT Walking throwing scarves up and catching x 2RT  Tandem gt over hurdles; (pt has no difficulty with this task) Marching x 10  Standing shifting wt as far as possible and hold for 4 seconds forward, back, Rt and Lt x 3 each    PATIENT EDUCATION: Education details: Educated on the pathoanatomy of balance and the vestibular  system. Educated on the goals and course of rehab. Educated on the measures to reduce risk of falls at home.  HOME EXERCISE PROGRAM: 03/30/23 HOME EXERCISES FOR YOUR DIZZINESS Do this 2-3x/day, 5-7 days/week  GAZE STABILIZATION EXERCISES These exercises are designed to improve your eyes' ability to track objects without getting dizzy. When performing these exercises, make sure you are facing a blank wall (like a white wall) to avoid distraction. You can wear your glasses/contact lenses if you must.   Before anything, warm-up your neck muscles by bending it forward and back 10 times, side-to-side 10 times, and slowly turn your head left and right 10 times.  Exercise 1 (Horizontal Saccades) Use two cards for this exercise on the wall in standing while holding on to a stable surface, look at the first item on the other card then look at the next item on the second card then alternate your gaze sequentially. Do NOT move your cards nor the your head. Do the following for one minute each: Identifying numbers Reading words Identifying shapes Exercise 2 (Paradigm x1) Use only one card for this exercise on the wall in standing while holding on to a stable surface, turn your head left and right at your own pace while keeping the card stationary.  Do this for one minute each while you are: Identifying numbers Reading words Identifying shapes  Exercise 4 (VOR cancellation) Use only one card for this exercise While holding the card in sitting with your arms outstretched, turn your body, arm, and head at the same time left and right at your own pace Do this for one minute each while you are: Identifying numbers Reading words Identifying shapes  Access Code: [email or web address] URL: [email or web address] 04/05/2023 - Heel Toe Raises with Counter Support  - 1-2 x daily - 5-7 x weekly - 2 sets - 10 reps  Date: 03/30/2023 Prepared by: Krystal Clark  Exercises - Seated Calf Stretch with  Strap  - 1-2 x daily - 5-7 x weekly - 3 reps - 30 hold - Standing Tandem Balance with Counter Support  - 1-2 x daily - 5-7 x weekly - 2 reps - 30 hold Access Code: [email or web address] URL: [email or web address] Date: 04/12/2023 Prepared by: Virgina Organ  Exercises - Corner Balance Feet Together With Eyes Open  - 1 x daily - 7 x weekly - 1 sets - 10 reps - Semi-Tandem Corner Balance With Eyes Open  - 1 x daily - 7 x weekly - 1 sets - 10 reps - Sit to Stand  - 2 x daily - 7 x weekly - 3 sets - 10 reps - Standing Marching  - 2 x daily - 7 x weekly - 3 sets - 10 reps - Seated Ankle Dorsiflexion AROM  - 5 x daily - 7 x weekly - 1 sets - 10 reps - 5" hold GOALS: Goals reviewed with patient? Yes  SHORT TERM GOALS: Target date: 04/11/23  Pt will demonstrate indep in HEP to facilitate carry-over of skilled services and improve functional outcomes Goal status: met  LONG TERM GOALS: Target date: 04/25/23  Pt will improve DGI by at least 3 points in order to demonstrate clinically significant improvement in balance and decreased risk for falls  Baseline: 14; 20/24 04/20/23 Goal status: MET  2.  Pt will increase by at least 40 ft in order to demonstrate clinically significant improvement in community ambulation Baseline: 457 ft; 488 ft 04/20/23 (improved by 31 ft) Goal status: IN PROGRESS  3.  Pt will demonstrate an increase in mCTSIB total score by 30 seconds to facilitate ease and safety in ambulation Baseline: 19/120 Goal status: IN PROGRESS   ASSESSMENT:  CLINICAL IMPRESSION: Progress note today.  Patient met goal for DGI and made significant improvement on her 2 MWT although did not quite meet goal; improved by 31 ft; goal was 40 ft.  She is satisfied with her current functional level and agreeable to discharge     OBJECTIVE IMPAIRMENTS: Abnormal gait, decreased activity tolerance, decreased balance, difficulty walking, decreased safety awareness, impaired perceived  functional ability, and impaired flexibility.   ACTIVITY LIMITATIONS: carrying, lifting, bending, standing, squatting, and stairs  PARTICIPATION LIMITATIONS: meal prep, cleaning, laundry, shopping, community activity, and yard work  PERSONAL FACTORS: Age, Past/current experiences, and Time since onset of injury/illness/exacerbation are also affecting patient's functional outcome.   REHAB POTENTIAL: Fair    CLINICAL DECISION MAKING: Stable/uncomplicated  EVALUATION COMPLEXITY: Low   PLAN:  PT FREQUENCY: 2x/week  PT DURATION: 4 weeks  PLANNED INTERVENTIONS: 97164- PT Re-evaluation, 97110-Therapeutic exercises, 97530- Therapeutic activity, [id number]- Neuromuscular re-education, 97535- Self Care, 21308- Manual therapy, [phone]- Gait training, Patient/Family education, and Stair training  PLAN FOR NEXT SESSION: discharge  12:21 PM, 04/20/23 Trea Latner Small Germani Gavilanes MPT [facility] [place] [phone]

## 2023-04-25 ENCOUNTER — Ambulatory Visit: Payer: Medicare HMO | Admitting: Cardiology

## 2023-04-27 ENCOUNTER — Encounter: Payer: Self-pay | Admitting: Family Medicine

## 2023-05-01 ENCOUNTER — Other Ambulatory Visit: Payer: Self-pay | Admitting: Family Medicine

## 2023-05-01 MED ORDER — SERTRALINE HCL 50 MG PO TABS: 50.0000 mg | ORAL_TABLET | Freq: Every day | ORAL | 1 refills | Status: DC

## 2023-05-08 ENCOUNTER — Other Ambulatory Visit: Payer: Self-pay | Admitting: Family Medicine

## 2023-05-08 DIAGNOSIS — Z1211 Encounter for screening for malignant neoplasm of colon: Secondary | ICD-10-CM

## 2023-05-16 ENCOUNTER — Encounter: Payer: Self-pay | Admitting: Family Medicine

## 2023-05-19 ENCOUNTER — Encounter: Payer: Self-pay | Admitting: Cardiology

## 2023-05-29 DIAGNOSIS — H353134 Nonexudative age-related macular degeneration, bilateral, advanced atrophic with subfoveal involvement: Secondary | ICD-10-CM | POA: Diagnosis not present

## 2023-05-29 DIAGNOSIS — H2513 Age-related nuclear cataract, bilateral: Secondary | ICD-10-CM | POA: Diagnosis not present

## 2023-05-29 DIAGNOSIS — H43813 Vitreous degeneration, bilateral: Secondary | ICD-10-CM | POA: Diagnosis not present

## 2023-05-29 DIAGNOSIS — H35422 Microcystoid degeneration of retina, left eye: Secondary | ICD-10-CM | POA: Diagnosis not present

## 2023-06-04 ENCOUNTER — Other Ambulatory Visit: Payer: Self-pay | Admitting: Nurse Practitioner

## 2023-06-04 DIAGNOSIS — E063 Autoimmune thyroiditis: Secondary | ICD-10-CM

## 2023-06-05 ENCOUNTER — Ambulatory Visit: Admitting: Family Medicine

## 2023-06-09 ENCOUNTER — Telehealth: Payer: Self-pay | Admitting: Family Medicine

## 2023-06-09 DIAGNOSIS — E063 Autoimmune thyroiditis: Secondary | ICD-10-CM

## 2023-06-09 MED ORDER — LEVOTHYROXINE SODIUM 50 MCG PO TABS: 50.0000 ug | ORAL_TABLET | Freq: Every day | ORAL | 1 refills | Status: DC

## 2023-06-09 NOTE — Telephone Encounter (Signed)
 Copied from CRM [id number]. Topic: Clinical - Medication Refill >> Jun 09, 2023  8:45 AM Marissa P wrote: Medication: levothyroxine  (SYNTHROID ) 50 MCG tablet  Has the patient contacted their pharmacy? Yes (Agent: If no, request that the patient contact the pharmacy for the refill. If patient does not wish to contact the pharmacy document the reason why and proceed with request.) (Agent: If yes, when and what did the pharmacy advise?)  This is the patient's preferred pharmacy:   [facility] - [place], [place] - [address] Portal to [facility] [address] [place] [address] Phone: [phone] Fax: [phone]  Is this the correct pharmacy for this prescription? Yes If no, delete pharmacy and type the correct one.   Has the prescription been filled recently? Yes  Is the patient out of the medication? NO, WILL BE   Has the patient been seen for an appointment in the last year OR does the patient have an upcoming appointment? Yes  Can we respond through MyChart? Yes  Agent: Please be advised that Rx refills may take up to 3 business days. We ask that you follow-up with your pharmacy.

## 2023-06-09 NOTE — Telephone Encounter (Signed)
 Refill on    levothyroxine  (SYNTHROID ) 50 MCG tablet  [phone]

## 2023-06-12 ENCOUNTER — Encounter: Payer: Self-pay | Admitting: Cardiology

## 2023-06-12 ENCOUNTER — Other Ambulatory Visit: Payer: Self-pay | Admitting: Family Medicine

## 2023-06-12 ENCOUNTER — Ambulatory Visit: Attending: Cardiology | Admitting: Cardiology

## 2023-06-12 VITALS — BP 130/70 | HR 71 | Ht 66.0 in | Wt 134.6 lb

## 2023-06-12 DIAGNOSIS — R002 Palpitations: Secondary | ICD-10-CM | POA: Diagnosis not present

## 2023-06-12 DIAGNOSIS — I471 Supraventricular tachycardia, unspecified: Secondary | ICD-10-CM

## 2023-06-12 DIAGNOSIS — R931 Abnormal findings on diagnostic imaging of heart and coronary circulation: Secondary | ICD-10-CM | POA: Diagnosis not present

## 2023-06-12 DIAGNOSIS — Z1211 Encounter for screening for malignant neoplasm of colon: Secondary | ICD-10-CM | POA: Diagnosis not present

## 2023-06-12 MED ORDER — DIGOXIN 125 MCG PO TABS: 0.1250 mg | ORAL_TABLET | Freq: Every day | ORAL | 1 refills | Status: DC

## 2023-06-12 NOTE — Patient Instructions (Addendum)
 Medication Instructions:  Your physician has recommended you make the following change in your medication:  Start lanoxin 0.125 mg daily Continue all other medications as prescribed  Labwork: none  Testing/Procedures: none  Follow-Up: Your physician recommends that you schedule a follow-up appointment in: 4-6 weeks  Any Other Special Instructions Will Be Listed Below (If Applicable).  If you need a refill on your cardiac medications before your next appointment, please call your pharmacy.

## 2023-06-12 NOTE — Progress Notes (Signed)
 Cardiology Office Note  Date: 06/12/2023   ID: Nancy Williams, DOB 08-30-1940, MRN [id number]  History of Present Illness: Nancy Williams is an 83 y.o. female last seen in February 2024 by Ms. Tommi Fraise, I reviewed her note and subsequent testing.  Our last encounter was in 2021.  She is here today with her daughter.  She presents to discuss recurring sense of jitteriness, fatigue and shortness of breath.  She states that every morning when she gets up she feels jittery.  She has had no sudden onset symptoms or associated syncope.  She did wear a cardiac monitor earlier in the year for follow-up, the symptoms have been chronic for years.  Cardiac monitor did demonstrate 11 episodes of PSVT, the longest of which lasted for 1 minute and 41 seconds with heart rate in the 180s.  We had recommended Toprol -XL previously, she states that this did not help with symptoms and she ultimately stopped it, also concerned about weakness and lower blood pressure.  Low-dose Lopressor  was then recommended and she did not tolerate this either.  We went over her medications.  She continues on Synthroid  with normal TSH by last lab work.  I reviewed her ECG today which shows normal sinus rhythm with nonspecific ST changes.  Physical Exam: VS:  BP 130/70   Pulse 71   Ht 5\' 6"  (1.676 m)   Wt 134 lb 9.6 oz (61.1 kg)   SpO2 97%   BMI 21.73 kg/m , BMI Body mass index is 21.73 kg/m.  Wt Readings from Last 3 Encounters:  06/12/23 134 lb 9.6 oz (61.1 kg)  04/18/23 134 lb 6.4 oz (61 kg)  03/02/23 135 lb (61.2 kg)    General: Patient appears comfortable at rest. HEENT: Conjunctiva and lids normal. Neck: Supple, no elevated JVP or carotid bruits. Lungs: Clear to auscultation, nonlabored breathing at rest. Cardiac: Regular rate and rhythm, no S3 or significant systolic murmur, no pericardial rub.  ECG:  An ECG dated 03/14/2022 was personally reviewed today and demonstrated:  Sinus rhythm.  Labwork: 03/02/2023:  ALT 15; AST 25; BUN 22; Creatinine, Ser 0.77; Hemoglobin 14.4; Platelets 293; Potassium 4.9; Sodium 142; TSH 4.480     Component Value Date/Time   CHOL 243 (H) 03/02/2023 1536   TRIG 142 03/02/2023 1536   HDL 94 03/02/2023 1536   CHOLHDL 2.6 03/02/2023 1536   CHOLHDL 2.3 03/29/2021 1345   LDLCALC 125 (H) 03/02/2023 1536   LDLCALC 98 03/29/2021 1345   Other Studies Reviewed Today:  Cardiac monitor February 2024: ZIO monitor reviewed.  6 days 14 hours analyzed.   Predominant rhythm is sinus with heart rate ranging from 47 bpm up to 100 bpm with average heart rate 67 bpm. There were rare PACs including atrial couplets and triplets representing less than 1% total beats. There were occasional PVCs representing 2.2% total beats.  Otherwise rare ventricular couplets and triplets were noted as well as limited episodes of ventricular bigeminy and trigeminy. There were 11 episodes of PSVT noted, the longest of which lasted 1 minute and 41 seconds with average heart rate 181 bpm. No pauses.  Assessment and Plan:  1.  PSVT evident by follow-up cardiac monitor in February 2024.  Patient reports long-term feelings of jitteriness, fatigue and shortness of breath.  Still very difficult for me to correlate all of the symptoms with her cardiac monitor findings from February.  For instance, she indicates feeling jitteriness every morning, however clearly did not have PSVT every  single morning that she wore the monitor.  I do think that is very likely she experiences symptoms with her PSVT however.  She did not tolerate Toprol -XL or Lopressor  as discussed above.  Plan to initiate Lanoxin 0.125 mg daily and then bring her back for further discussion.  I have also talked with her about EP referral.  2.  Occasional PVCs, 2.2% overall burden by cardiac monitor in February 2024.  Very unlikely that this is an explanation for her symptoms.  LVEF was normal by echocardiogram in the past.  3.  Coronary calcium score  of 21.4 with mild nonobstructive coronary atherosclerosis by cardiac CT in February 2023.  4.  Hypothyroidism.  TSH normal at 4.48 in January.  Disposition:  Follow up 4-6 weeks.  Signed, Gerard Knight, M.D., F.A.C.C. [facility]

## 2023-06-19 ENCOUNTER — Ambulatory Visit: Payer: Self-pay | Admitting: Family Medicine

## 2023-06-19 LAB — COLOGUARD: COLOGUARD: NEGATIVE

## 2023-06-26 ENCOUNTER — Encounter: Payer: Self-pay | Admitting: Cardiology

## 2023-06-26 DIAGNOSIS — I471 Supraventricular tachycardia, unspecified: Secondary | ICD-10-CM

## 2023-06-26 DIAGNOSIS — R002 Palpitations: Secondary | ICD-10-CM

## 2023-06-30 ENCOUNTER — Encounter: Payer: Self-pay | Admitting: Family Medicine

## 2023-07-03 ENCOUNTER — Other Ambulatory Visit: Payer: Self-pay | Admitting: Family Medicine

## 2023-07-03 DIAGNOSIS — I471 Supraventricular tachycardia, unspecified: Secondary | ICD-10-CM

## 2023-07-05 DIAGNOSIS — I471 Supraventricular tachycardia, unspecified: Secondary | ICD-10-CM | POA: Diagnosis not present

## 2023-07-06 ENCOUNTER — Ambulatory Visit: Payer: Self-pay | Admitting: Family Medicine

## 2023-07-06 LAB — TSH+FREE T4
Free T4: 1.23 ng/dL (ref 0.82–1.77)
TSH: 2.4 u[IU]/mL (ref 0.450–4.500)

## 2023-07-06 LAB — T3, FREE: T3, Free: 2.4 pg/mL (ref 2.0–4.4)

## 2023-07-25 ENCOUNTER — Encounter: Payer: Self-pay | Admitting: Family Medicine

## 2023-08-02 ENCOUNTER — Ambulatory Visit: Admitting: Cardiology

## 2023-08-03 ENCOUNTER — Other Ambulatory Visit: Payer: Self-pay

## 2023-08-03 ENCOUNTER — Ambulatory Visit: Attending: Internal Medicine | Admitting: Internal Medicine

## 2023-08-03 ENCOUNTER — Encounter: Payer: Self-pay | Admitting: Internal Medicine

## 2023-08-03 VITALS — BP 137/67 | HR 76 | Ht 66.0 in | Wt 135.8 lb

## 2023-08-03 DIAGNOSIS — Z01812 Encounter for preprocedural laboratory examination: Secondary | ICD-10-CM

## 2023-08-03 DIAGNOSIS — I471 Supraventricular tachycardia, unspecified: Secondary | ICD-10-CM

## 2023-08-03 DIAGNOSIS — R002 Palpitations: Secondary | ICD-10-CM

## 2023-08-03 DIAGNOSIS — I25700 Atherosclerosis of coronary artery bypass graft(s), unspecified, with unstable angina pectoris: Secondary | ICD-10-CM

## 2023-08-03 LAB — CBC
Hematocrit: 42.8 % (ref 34.0–46.6)
Hemoglobin: 13.6 g/dL (ref 11.1–15.9)
MCH: 29.9 pg (ref 26.6–33.0)
MCHC: 31.8 g/dL (ref 31.5–35.7)
MCV: 94 fL (ref 79–97)
Platelets: 218 10*3/uL (ref 150–450)
RBC: 4.55 x10E6/uL (ref 3.77–5.28)
RDW: 13.4 % (ref 11.7–15.4)
WBC: 7.7 10*3/uL (ref 3.4–10.8)

## 2023-08-03 NOTE — Progress Notes (Signed)
 HPI [name] is a very confusing 83 yo woman with a h/o palpitations and documented SVT. She has a strong family h/o CAD and her father died of an MI and her son has had an MI. She has poorly characterized symptoms and makes me a poor historian. The patient was found to have recurrent SVT at rates of up to 180/min. These are symptomatic. In addition, she c/o feeling of weakness and chest pressure made worse by exertion. She gets a tightness in her chest, neck and jaw which are reproducible. No syncope.  No Known Allergies   Current Outpatient Medications  Medication Sig Dispense Refill   b complex vitamins tablet Take 1 tablet by mouth daily.     Calcium Carb-Cholecalciferol (CALCIUM 500+D3 PO) Take 1 capsule by mouth daily. 200 units of vit D     [employer] OMEGA-3 KRILL OIL PO Take 1 capsule by mouth daily.     fluticasone  (FLONASE ) 50 MCG/ACT nasal spray USE 1 SPRAY(S) IN EACH NOSTRIL AS NEEDED FOR  ALLERGIES  OR  RHINITIS 16 g 0   levothyroxine  (SYNTHROID ) 50 MCG tablet Take 1 tablet (50 mcg total) by mouth daily before breakfast. 90 tablet 1   Magnesium Glycinate 120 MG CAPS Take 1 capsule by mouth daily.     Multiple Vitamins-Minerals (PRESERVISION AREDS 2) CAPS Take 1 capsule by mouth daily.     UNABLE TO FIND Take 1 capsule by mouth daily. Med Name: Cell Salt     VITAMIN D-VITAMIN K PO Take 1 capsule by mouth daily.     digoxin  (LANOXIN ) 0.125 MG tablet Take 1 tablet (0.125 mg total) by mouth daily. (Patient not taking: Reported on 08/03/2023) 30 tablet 1   No current facility-administered medications for this visit.     Past Medical History:  Diagnosis Date   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    Hypothyroidism    Hashimoto's per [facility] New Patient Packet    Neuropathy    Osteoarthritis    Per patient at New patient appointment    Seasonal allergies    Vitamin D deficiency     ROS:   All systems reviewed and negative except as noted in the  HPI.   Past Surgical History:  Procedure Laterality Date   No previous surgery       Family History  Problem Relation Age of Onset   Heart disease Mother    Heart failure Mother    Heart disease Father    Prostate cancer Father    Heart disease Son    Diabetes Son    Prostate cancer Son    Thyroid  disease Son    Hashimoto's thyroiditis Daughter      Social History   Socioeconomic History   Marital status: Widowed    Spouse name: Not on file   Number of children: Not on file   Years of education: Not on file   Highest education level: 12th grade  Occupational History   Not on file  Tobacco Use   Smoking status: Never   Smokeless tobacco: Never  Vaping Use   Vaping status: Never Used  Substance and Sexual Activity   Alcohol use: Yes    Comment: Occasional, last drink 1-2 years ago as of 2021    Drug use: Never   Sexual activity: Not on file  Other Topics Concern   Not on file  Social History Narrative   Per [facility] New Patient Packet Abstracted on 08/19/2019:  Diet: Vegetarian, gluten free       Caffeine: Coffee & Tea      Married, if yes what year: Widow, 1959      Do you live in a house, apartment, assisted living, condo, trailer, ect: House      Is it one or more stories: One stories, one person       Pets: 4 cats       Current/Past profession: Left Blank      Highest level or education completed: 12 th grade       Exercise:        Try          Type and how often: Need to clarify, unable to read what patient wrote          Living Will: Yes   DNR: Yes   POA/HPOA: Yes      Functional Status:   Do you have difficulty bathing or dressing yourself? No   Do you have difficulty preparing food or eating?No   Do you have difficulty managing your medications? No   Do you have difficulty managing your finances? No   Do you have difficulty affording your medications? No         Right Handed    Lives in a one story home    Social Drivers of Health    Financial Resource Strain: Low Risk  (02/24/2023)   Overall Financial Resource Strain (CARDIA)    Difficulty of Paying Living Expenses: Not hard at all  Food Insecurity: No Food Insecurity (02/24/2023)   Hunger Vital Sign    Worried About Running Out of Food in the Last Year: Never true    Ran Out of Food in the Last Year: Never true  Transportation Needs: No Transportation Needs (02/24/2023)   PRAPARE - Administrator, Civil Service (Medical): No    Lack of Transportation (Non-Medical): No  Physical Activity: Sufficiently Active (10/06/2022)   Exercise Vital Sign    Days of Exercise per Week: 6 days    Minutes of Exercise per Session: 30 min  Stress: No Stress Concern Present (02/24/2023)   [employer] of Occupational Health - Occupational Stress Questionnaire    Feeling of Stress : Only a little  Social Connections: Moderately Isolated (02/24/2023)   Social Connection and Isolation Panel    Frequency of Communication with Friends and Family: More than three times a week    Frequency of Social Gatherings with Friends and Family: Once a week    Attends Religious Services: Never    Database administrator or Organizations: Yes    Attends Engineer, structural: More than 4 times per year    Marital Status: Widowed  Catering manager Violence: Not on file     BP 137/67 (BP Location: Left Arm, Patient Position: Sitting, Cuff Size: Normal)   Pulse 76   Ht 5' 6 (1.676 m)   Wt 135 lb 12.8 oz (61.6 kg)   SpO2 98%   BMI 21.92 kg/m   Physical Exam:  Well appearing NAD HEENT: Unremarkable Neck:  No JVD, no thyromegally Lymphatics:  No adenopathy Back:  No CVA tenderness Lungs:  Clear with no wheezes HEART:  Regular rate rhythm, no murmurs, no rubs, no clicks Abd:  soft, positive bowel sounds, no organomegally, no rebound, no guarding Ext:  2 plus pulses, no edema, no cyanosis, no clubbing Skin:  No rashes no nodules Neuro:  CN II through XII intact, motor  grossly intact  EKG - nsr with frequent PVC's  Assess/Plan: SVT - she is symptomatic but this is not her main complaint. I discussed catheter ablation briefly. We will revisit in the future. Exertional angina - her symptoms are fairly typical and appear to be worsening. I have her walk in the halls with me today and she experienced angina which resolved with rest. I have recommended she pursue diagnostic left heart cath.  Weakness and jitteriness - the etiology is unclear. Seems like there is a component of anxiety but she denies feeling anxious. I am perplexed by her symptoms and am unable to offer an explanation or treatment.   Danelle Kimo Bancroft,MD

## 2023-08-03 NOTE — Patient Instructions (Signed)
 Medication Instructions:  Your physician recommends that you continue on your current medications as directed. Please refer to the Current Medication list given to you today.  *If you need a refill on your cardiac medications before your next appointment, please call your pharmacy*  Lab Work: TODAY: CBC, BMET If you have labs (blood work) drawn today and your tests are completely normal, you will receive your results only by: MyChart Message (if you have MyChart) OR A paper copy in the mail If you have any lab test that is abnormal or we need to change your treatment, we will call you to review the results.  Testing/Procedures: Your physician has requested that you have a cardiac catheterization. Cardiac catheterization is used to diagnose and/or treat various heart conditions. Doctors may recommend this procedure for a number of different reasons. The most common reason is to evaluate chest pain. Chest pain can be a symptom of coronary artery disease (CAD), and cardiac catheterization can show whether plaque is narrowing or blocking your heart's arteries. This procedure is also used to evaluate the valves, as well as measure the blood flow and oxygen levels in different parts of your heart. For further information please visit [email or web address] Please follow instruction sheet, as given.  Follow-Up: At [facility], you and your health needs are our priority.  As part of our continuing mission to provide you with exceptional heart care, our providers are all part of one team.  This team includes your primary Cardiologist (physician) and Advanced Practice Providers or APPs (Physician Assistants and Nurse Practitioners) who all work together to provide you with the care you need, when you need it.  Your next appointment:   TBD after heart catherization  We recommend signing up for the patient portal called MyChart.  Sign up information is provided on this After Visit Summary.   MyChart is used to connect with patients for Virtual Visits (Telemedicine).  Patients are able to view lab/test results, encounter notes, upcoming appointments, etc.  Non-urgent messages can be sent to your provider as well.   To learn more about what you can do with MyChart, go to [email or web address].   Other Instructions     Cardiac Catheterization  You are scheduled for a Cardiac Catheterization on Thursday, July 10 with Dr. Peter Swaziland.  1. Please arrive at the [facility] (Main Entrance A) at [facility]: [address] [place], [place] [address] at 11:30 AM (This time is 2 hour(s) before your procedure to ensure your preparation).   Free valet parking service is available. You will check in at ADMITTING. The support person will be asked to wait in the waiting room.  It is OK to have someone drop you off and come back when you are ready to be discharged.        Special note: Every effort is made to have your procedure done on time. Please understand that emergencies sometimes delay scheduled procedures.  2. Diet: Do not eat solid foods after midnight.  You may have clear liquids until 5 AM the day of the procedure.  3. Labs: Today (CBC, BMET)  4. Medication instructions in preparation for your procedure:   Contrast Allergy: No  On the morning of your procedure, take Aspirin 81 mg and any morning medicines NOT listed above.  You may use sips of water.  5. Plan to go home the same day, you will only stay overnight if medically necessary. 6. You MUST have a responsible adult to drive  you home. 7. An adult MUST be with you the first 24 hours after you arrive home. 8. Bring a current list of your medications, and the last time and date medication taken. 9. Bring ID and current insurance cards. 10.Please wear clothes that are easy to get on and off and wear slip-on shoes.  Thank you for allowing us  to care for you!   -- [facility]

## 2023-08-03 NOTE — H&P (View-Only) (Signed)
 HPI Nancy Williams is a very confusing 83 yo woman with a h/o palpitations and documented SVT. She has a strong family h/o CAD and her father died of an MI and her son has had an MI. She has poorly characterized symptoms and makes me a poor historian. The patient was found to have recurrent SVT at rates of up to 180/min. These are symptomatic. In addition, she c/o feeling of weakness and chest pressure made worse by exertion. She gets a tightness in her chest, neck and jaw which are reproducible. No syncope.  No Known Allergies   Current Outpatient Medications  Medication Sig Dispense Refill   b complex vitamins tablet Take 1 tablet by mouth daily.     Calcium Carb-Cholecalciferol (CALCIUM 500+D3 PO) Take 1 capsule by mouth daily. 200 units of vit D     CVS OMEGA-3 KRILL OIL PO Take 1 capsule by mouth daily.     fluticasone  (FLONASE ) 50 MCG/ACT nasal spray USE 1 SPRAY(S) IN EACH NOSTRIL AS NEEDED FOR  ALLERGIES  OR  RHINITIS 16 g 0   levothyroxine  (SYNTHROID ) 50 MCG tablet Take 1 tablet (50 mcg total) by mouth daily before breakfast. 90 tablet 1   Magnesium Glycinate 120 MG CAPS Take 1 capsule by mouth daily.     Multiple Vitamins-Minerals (PRESERVISION AREDS 2) CAPS Take 1 capsule by mouth daily.     UNABLE TO FIND Take 1 capsule by mouth daily. Med Name: Cell Salt     VITAMIN D-VITAMIN K PO Take 1 capsule by mouth daily.     digoxin  (LANOXIN ) 0.125 MG tablet Take 1 tablet (0.125 mg total) by mouth daily. (Patient not taking: Reported on 08/03/2023) 30 tablet 1   No current facility-administered medications for this visit.     Past Medical History:  Diagnosis Date   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    Hypothyroidism    Hashimoto's per [facility] New Patient Packet    Neuropathy    Osteoarthritis    Per patient at New patient appointment    Seasonal allergies    Vitamin D deficiency     ROS:   All systems reviewed and negative except as noted in the  HPI.   Past Surgical History:  Procedure Laterality Date   No previous surgery       Family History  Problem Relation Age of Onset   Heart disease Mother    Heart failure Mother    Heart disease Father    Prostate cancer Father    Heart disease Son    Diabetes Son    Prostate cancer Son    Thyroid  disease Son    Hashimoto's thyroiditis Daughter      Social History   Socioeconomic History   Marital status: Widowed    Spouse name: Not on file   Number of children: Not on file   Years of education: Not on file   Highest education level: 12th grade  Occupational History   Not on file  Tobacco Use   Smoking status: Never   Smokeless tobacco: Never  Vaping Use   Vaping status: Never Used  Substance and Sexual Activity   Alcohol use: Yes    Comment: Occasional, last drink 1-2 years ago as of 2021    Drug use: Never   Sexual activity: Not on file  Other Topics Concern   Not on file  Social History Narrative   Per [facility] New Patient Packet Abstracted on 08/19/2019:  Diet: Vegetarian, gluten free       Caffeine: Coffee & Tea      Married, if yes what year: Widow, 1959      Do you live in a house, apartment, assisted living, condo, trailer, ect: House      Is it one or more stories: One stories, one person       Pets: 4 cats       Current/Past profession: Left Blank      Highest level or education completed: 12 th grade       Exercise:        Try          Type and how often: Need to clarify, unable to read what patient wrote          Living Will: Yes   DNR: Yes   POA/HPOA: Yes      Functional Status:   Do you have difficulty bathing or dressing yourself? No   Do you have difficulty preparing food or eating?No   Do you have difficulty managing your medications? No   Do you have difficulty managing your finances? No   Do you have difficulty affording your medications? No         Right Handed    Lives in a one story home    Social Drivers of Health    Financial Resource Strain: Low Risk  (02/24/2023)   Overall Financial Resource Strain (CARDIA)    Difficulty of Paying Living Expenses: Not hard at all  Food Insecurity: No Food Insecurity (02/24/2023)   Hunger Vital Sign    Worried About Running Out of Food in the Last Year: Never true    Ran Out of Food in the Last Year: Never true  Transportation Needs: No Transportation Needs (02/24/2023)   PRAPARE - Administrator, Civil Service (Medical): No    Lack of Transportation (Non-Medical): No  Physical Activity: Sufficiently Active (10/06/2022)   Exercise Vital Sign    Days of Exercise per Week: 6 days    Minutes of Exercise per Session: 30 min  Stress: No Stress Concern Present (02/24/2023)   [employer] of Occupational Health - Occupational Stress Questionnaire    Feeling of Stress : Only a little  Social Connections: Moderately Isolated (02/24/2023)   Social Connection and Isolation Panel    Frequency of Communication with Friends and Family: More than three times a week    Frequency of Social Gatherings with Friends and Family: Once a week    Attends Religious Services: Never    Database administrator or Organizations: Yes    Attends Engineer, structural: More than 4 times per year    Marital Status: Widowed  Catering manager Violence: Not on file     BP 137/67 (BP Location: Left Arm, Patient Position: Sitting, Cuff Size: Normal)   Pulse 76   Ht 5' 6 (1.676 m)   Wt 135 lb 12.8 oz (61.6 kg)   SpO2 98%   BMI 21.92 kg/m   Physical Exam:  Well appearing NAD HEENT: Unremarkable Neck:  No JVD, no thyromegally Lymphatics:  No adenopathy Back:  No CVA tenderness Lungs:  Clear with no wheezes HEART:  Regular rate rhythm, no murmurs, no rubs, no clicks Abd:  soft, positive bowel sounds, no organomegally, no rebound, no guarding Ext:  2 plus pulses, no edema, no cyanosis, no clubbing Skin:  No rashes no nodules Neuro:  CN II through XII intact, motor  grossly intact  EKG - nsr with frequent PVC's  Assess/Plan: SVT - she is symptomatic but this is not her main complaint. I discussed catheter ablation briefly. We will revisit in the future. Exertional angina - her symptoms are fairly typical and appear to be worsening. I have her walk in the halls with me today and she experienced angina which resolved with rest. I have recommended she pursue diagnostic left heart cath.  Weakness and jitteriness - the etiology is unclear. Seems like there is a component of anxiety but she denies feeling anxious. I am perplexed by her symptoms and am unable to offer an explanation or treatment.   Danelle Kimo Bancroft,MD

## 2023-08-04 LAB — BASIC METABOLIC PANEL WITH GFR
BUN/Creatinine Ratio: 30 — ABNORMAL HIGH (ref 12–28)
BUN: 25 mg/dL (ref 8–27)
CO2: 21 mmol/L (ref 20–29)
Calcium: 9.5 mg/dL (ref 8.7–10.3)
Chloride: 106 mmol/L (ref 96–106)
Creatinine, Ser: 0.82 mg/dL (ref 0.57–1.00)
Glucose: 86 mg/dL (ref 70–99)
Potassium: 5.1 mmol/L (ref 3.5–5.2)
Sodium: 143 mmol/L (ref 134–144)
eGFR: 70 mL/min/1.73 (ref >59)

## 2023-08-08 ENCOUNTER — Telehealth: Payer: Self-pay | Admitting: *Deleted

## 2023-08-08 DIAGNOSIS — H35422 Microcystoid degeneration of retina, left eye: Secondary | ICD-10-CM | POA: Diagnosis not present

## 2023-08-08 DIAGNOSIS — H43813 Vitreous degeneration, bilateral: Secondary | ICD-10-CM | POA: Diagnosis not present

## 2023-08-08 DIAGNOSIS — H2513 Age-related nuclear cataract, bilateral: Secondary | ICD-10-CM | POA: Diagnosis not present

## 2023-08-08 DIAGNOSIS — H353134 Nonexudative age-related macular degeneration, bilateral, advanced atrophic with subfoveal involvement: Secondary | ICD-10-CM | POA: Diagnosis not present

## 2023-08-08 NOTE — Telephone Encounter (Signed)
 Cardiac Catheterization scheduled at [facility] for: Thursday August 10, 2023 1:30 PM Arrival time [facility] Main Entrance A at: 11:30 AM  Nothing to eat after midnight prior to procedure, clear liquids until 5 AM day of procedure.  Medication instructions: -Usual morning medications can be taken with sips of water including aspirin  81 mg.  Plan to go home the same day, you will only stay overnight if medically necessary.  You must have responsible adult to drive you home.  Someone must be with you the first 24 hours after you arrive home.  Call placed to patient to review procedure instructions, no answer.

## 2023-08-08 NOTE — Telephone Encounter (Signed)
 Patient is returning call.

## 2023-08-09 ENCOUNTER — Ambulatory Visit: Payer: Self-pay | Admitting: Internal Medicine

## 2023-08-09 NOTE — Telephone Encounter (Signed)
 Reviewed procedure instructions with patient.

## 2023-08-10 ENCOUNTER — Ambulatory Visit (HOSPITAL_COMMUNITY)
Admission: RE | Admit: 2023-08-10 | Discharge: 2023-08-10 | Disposition: A | Discharge status: Home / Self Care | Attending: Cardiology | Admitting: Cardiology

## 2023-08-10 ENCOUNTER — Encounter (HOSPITAL_COMMUNITY)
Admission: RE | Disposition: A | Discharge status: Home / Self Care | Payer: Self-pay | Source: Home / Self Care | Attending: Cardiology

## 2023-08-10 ENCOUNTER — Encounter: Payer: Self-pay | Admitting: Family Medicine

## 2023-08-10 ENCOUNTER — Other Ambulatory Visit: Payer: Self-pay

## 2023-08-10 DIAGNOSIS — I2089 Other forms of angina pectoris: Secondary | ICD-10-CM | POA: Diagnosis not present

## 2023-08-10 DIAGNOSIS — R531 Weakness: Secondary | ICD-10-CM | POA: Insufficient documentation

## 2023-08-10 DIAGNOSIS — R079 Chest pain, unspecified: Secondary | ICD-10-CM

## 2023-08-10 DIAGNOSIS — Z8249 Family history of ischemic heart disease and other diseases of the circulatory system: Secondary | ICD-10-CM | POA: Insufficient documentation

## 2023-08-10 DIAGNOSIS — I471 Supraventricular tachycardia, unspecified: Secondary | ICD-10-CM | POA: Insufficient documentation

## 2023-08-10 HISTORY — PX: LEFT HEART CATH AND CORONARY ANGIOGRAPHY: CATH118249

## 2023-08-10 SURGERY — LEFT HEART CATH AND CORONARY ANGIOGRAPHY
Anesthesia: LOCAL

## 2023-08-10 MED ORDER — SODIUM CHLORIDE 0.9 % WEIGHT BASED INFUSION: 1.0000 mL/kg/h | INTRAVENOUS | Status: DC

## 2023-08-10 MED ORDER — MIDAZOLAM HCL 2 MG/2ML IJ SOLN
INTRAMUSCULAR | Status: AC
  Filled 2023-08-10: qty 2

## 2023-08-10 MED ORDER — SODIUM CHLORIDE 0.9 % IV SOLN: 250.0000 mL | INTRAVENOUS | Status: DC | PRN

## 2023-08-10 MED ORDER — VERAPAMIL HCL 2.5 MG/ML IV SOLN
INTRAVENOUS | Status: DC | PRN
  Administered 2023-08-10: 5 mL via INTRA_ARTERIAL

## 2023-08-10 MED ORDER — IOHEXOL 350 MG/ML SOLN
INTRAVENOUS | Status: DC | PRN
Start: 2023-08-10 — End: 2023-08-10
  Administered 2023-08-10: 45 mL

## 2023-08-10 MED ORDER — SODIUM CHLORIDE 0.9 % WEIGHT BASED INFUSION: 3.0000 mL/kg/h | INTRAVENOUS | Status: AC

## 2023-08-10 MED ORDER — ACETAMINOPHEN 325 MG PO TABS: 650.0000 mg | ORAL_TABLET | ORAL | Status: DC | PRN

## 2023-08-10 MED ORDER — FENTANYL CITRATE (PF) 100 MCG/2ML IJ SOLN
INTRAMUSCULAR | Status: AC
  Filled 2023-08-10: qty 2

## 2023-08-10 MED ORDER — ASPIRIN 81 MG PO CHEW: 81.0000 mg | CHEWABLE_TABLET | ORAL | Status: DC

## 2023-08-10 MED ORDER — VERAPAMIL HCL 2.5 MG/ML IV SOLN
INTRAVENOUS | Status: AC
  Filled 2023-08-10: qty 2

## 2023-08-10 MED ORDER — SODIUM CHLORIDE 0.9% FLUSH: 3.0000 mL | INTRAVENOUS | Status: DC | PRN

## 2023-08-10 MED ORDER — SODIUM CHLORIDE 0.9% FLUSH: 3.0000 mL | Freq: Two times a day (BID) | INTRAVENOUS | Status: DC

## 2023-08-10 MED ORDER — ONDANSETRON HCL 4 MG/2ML IJ SOLN: 4.0000 mg | Freq: Four times a day (QID) | INTRAMUSCULAR | Status: DC | PRN

## 2023-08-10 MED ORDER — MIDAZOLAM HCL 2 MG/2ML IJ SOLN
INTRAMUSCULAR | Status: DC | PRN
  Administered 2023-08-10: 1 mg via INTRAVENOUS

## 2023-08-10 MED ORDER — HEPARIN (PORCINE) IN NACL 1000-0.9 UT/500ML-% IV SOLN
INTRAVENOUS | Status: DC | PRN
Start: 2023-08-10 — End: 2023-08-10
  Administered 2023-08-10 (×2): 500 mL

## 2023-08-10 MED ORDER — LIDOCAINE HCL (PF) 1 % IJ SOLN
INTRAMUSCULAR | Status: DC | PRN
  Administered 2023-08-10: 2 mL via INTRADERMAL

## 2023-08-10 MED ORDER — LIDOCAINE HCL (PF) 1 % IJ SOLN
INTRAMUSCULAR | Status: AC
  Filled 2023-08-10: qty 30

## 2023-08-10 MED ORDER — FENTANYL CITRATE (PF) 100 MCG/2ML IJ SOLN
INTRAMUSCULAR | Status: DC | PRN
  Administered 2023-08-10: 25 ug via INTRAVENOUS

## 2023-08-10 MED ORDER — HEPARIN SODIUM (PORCINE) 1000 UNIT/ML IJ SOLN
INTRAMUSCULAR | Status: DC | PRN
  Administered 2023-08-10: 3000 [IU] via INTRAVENOUS

## 2023-08-10 MED ORDER — HEPARIN SODIUM (PORCINE) 1000 UNIT/ML IJ SOLN
INTRAMUSCULAR | Status: AC
  Filled 2023-08-10: qty 10

## 2023-08-10 SURGICAL SUPPLY — 8 items
CATH 5FR JL3.5 JR4 ANG PIG MP (CATHETERS) IMPLANT
DEVICE RAD COMP TR BAND LRG (VASCULAR PRODUCTS) IMPLANT
GLIDESHEATH SLEND SS 6F .021 (SHEATH) IMPLANT
GUIDEWIRE INQWIRE 1.5J.035X260 (WIRE) IMPLANT
PACK CARDIAC CATHETERIZATION (CUSTOM PROCEDURE TRAY) ×1 IMPLANT
SET ATX-X65L (MISCELLANEOUS) IMPLANT
SHEATH PROBE COVER 6X72 (BAG) IMPLANT
STATION PROTECTION PRESSURIZED (MISCELLANEOUS) IMPLANT

## 2023-08-10 NOTE — Interval H&P Note (Signed)
 History and Physical Interval Note:  08/10/2023 1:06 PM  Nancy Williams  has presented today for surgery, with the diagnosis of cad.  The various methods of treatment have been discussed with the patient and family. After consideration of risks, benefits and other options for treatment, the patient has consented to  Procedure(s): LEFT HEART CATH AND CORONARY ANGIOGRAPHY (N/A) as a surgical intervention.  The patient's history has been reviewed, patient examined, no change in status, stable for surgery.  I have reviewed the patient's chart and labs.  Questions were answered to the patient's satisfaction.   Cath Lab Visit (complete for each Cath Lab visit)  Clinical Evaluation Leading to the Procedure:   ACS: No.  Non-ACS:    Anginal Classification: CCS III  Anti-ischemic medical therapy: No Therapy  Non-Invasive Test Results: No non-invasive testing performed  Prior CABG: No previous CABG        [facility] 08/10/2023 1:06 PM

## 2023-08-10 NOTE — Progress Notes (Signed)
 Tr band removed at 1655, gauze dressing applied. Right radial level 0, clean, dry, and intact.

## 2023-08-10 NOTE — Discharge Instructions (Signed)

## 2023-08-11 ENCOUNTER — Encounter (HOSPITAL_COMMUNITY): Payer: Self-pay | Admitting: Cardiology

## 2023-08-11 ENCOUNTER — Other Ambulatory Visit: Payer: Self-pay | Admitting: Family Medicine

## 2023-08-11 DIAGNOSIS — E039 Hypothyroidism, unspecified: Secondary | ICD-10-CM

## 2023-08-21 ENCOUNTER — Institutional Professional Consult (permissible substitution): Admitting: Internal Medicine

## 2023-08-23 ENCOUNTER — Ambulatory Visit: Admitting: Physician Assistant

## 2023-08-23 VITALS — BP 138/80 | HR 65 | Temp 98.1°F | Ht 66.0 in | Wt 134.0 lb

## 2023-08-23 DIAGNOSIS — R1013 Epigastric pain: Secondary | ICD-10-CM

## 2023-08-23 MED ORDER — OMEPRAZOLE 40 MG PO CPDR: 40.0000 mg | DELAYED_RELEASE_CAPSULE | Freq: Every day | ORAL | 3 refills | Status: AC

## 2023-08-23 MED ORDER — SUCRALFATE 1 G PO TABS
1.0000 g | ORAL_TABLET | Freq: Three times a day (TID) | ORAL | 1 refills | Status: AC | PRN
Start: 2023-08-23 — End: ?

## 2023-08-23 NOTE — Assessment & Plan Note (Signed)
 Patient presents today with worsening epigastric pain. Overall reassuring exam, mild epigastric tenderness with some tenderness throughout the abdomen. No distension, rebound, or guarding. Symptoms most consistent with GERD, will restart omeprazole  40 mg daily and trial Carafate  as needed for symptoms. Discussed future GI referral if symptoms do not improve, however patient wishes to move forward with referral today. Discussed dietary changes such as avoiding acidic foods such as citrus, tomatoes, chocolate, and coffee. Warning signs and ER precautions discussed.

## 2023-08-23 NOTE — Progress Notes (Signed)
 Acute Office Visit  Subjective:     Patient ID: Nancy Williams, female    DOB: [birth year], 83 y.o.   MRN: [id number]   Patient presents today with complaints of epigastric abdominal pain. She states symptoms have been intermittent for approximately 6 months, however worsened last night. She endorses epigastric pain with some radiation throughout her abdomen, specifically her lower quadrants. She is unable to explain a pattern of symptoms and is unsure if symptoms are better or worse after eating. She denies fevers, nausea, vomiting, or change in bowel movements. Relates she has taken omeprazole  in the past and it was beneficial for symptoms.      Review of Systems  Constitutional:  Negative for fever, malaise/fatigue and weight loss.  Respiratory:  Negative for cough.   Gastrointestinal:  Positive for abdominal pain. Negative for blood in stool, constipation, diarrhea, heartburn, nausea and vomiting.  Genitourinary:  Negative for dysuria.        Objective:     BP 138/80   Pulse 65   Temp 98.1 F (36.7 C)   Ht 5' 6 (1.676 m)   Wt 134 lb (60.8 kg)   SpO2 97%   BMI 21.63 kg/m   Physical Exam Constitutional:      General: She is not in acute distress.    Appearance: Normal appearance. She is normal weight. She is not ill-appearing.  HENT:     Head: Normocephalic.     Mouth/Throat:     Mouth: Mucous membranes are moist.     Pharynx: Oropharynx is clear.  Eyes:     Extraocular Movements: Extraocular movements intact.     Conjunctiva/sclera: Conjunctivae normal.  Cardiovascular:     Rate and Rhythm: Normal rate and regular rhythm.     Heart sounds: Normal heart sounds. No murmur heard. Pulmonary:     Effort: Pulmonary effort is normal.     Breath sounds: No wheezing or rales.  Abdominal:     General: Abdomen is flat. Bowel sounds are normal. There is no distension.     Palpations: Abdomen is soft.     Tenderness: There is abdominal tenderness in the epigastric area.  There is no guarding or rebound. Negative signs include McBurney's sign.  Skin:    General: Skin is warm and dry.  Neurological:     General: No focal deficit present.     Mental Status: She is alert and oriented to person, place, and time.  Psychiatric:        Mood and Affect: Mood normal.        Behavior: Behavior normal.     No results found for any visits on 08/23/23.      Assessment & Plan:  Epigastric pain Assessment & Plan: Patient presents today with worsening epigastric pain. Overall reassuring exam, mild epigastric tenderness with some tenderness throughout the abdomen. No distension, rebound, or guarding. Symptoms most consistent with GERD, will restart omeprazole  40 mg daily and trial Carafate  as needed for symptoms. Discussed future GI referral if symptoms do not improve, however patient wishes to move forward with referral today. Discussed dietary changes such as avoiding acidic foods such as citrus, tomatoes, chocolate, and coffee. Warning signs and ER precautions discussed.   Orders: -     Omeprazole ; Take 1 capsule (40 mg total) by mouth daily.  Dispense: 30 capsule; Refill: 3 -     Sucralfate ; Take 1 tablet (1 g total) by mouth 3 (three) times daily as needed.  Dispense: 30 tablet;  Refill: 1 -     Ambulatory referral to Gastroenterology     Return if symptoms worsen or fail to improve.  Charmaine Veldon Wager, PA-C

## 2023-08-30 ENCOUNTER — Ambulatory Visit: Admitting: "Endocrinology

## 2023-08-30 ENCOUNTER — Encounter: Payer: Self-pay | Admitting: "Endocrinology

## 2023-08-30 VITALS — BP 120/80 | HR 84 | Ht 66.0 in | Wt 134.0 lb

## 2023-08-30 DIAGNOSIS — E039 Hypothyroidism, unspecified: Secondary | ICD-10-CM

## 2023-08-30 DIAGNOSIS — R5382 Chronic fatigue, unspecified: Secondary | ICD-10-CM

## 2023-08-30 DIAGNOSIS — F419 Anxiety disorder, unspecified: Secondary | ICD-10-CM | POA: Diagnosis not present

## 2023-08-30 NOTE — Progress Notes (Signed)
 Outpatient Endocrinology Note Obadiah Birmingham, MD  08/30/23   Nancy Williams 11/30/1940 [id number]  Referring Provider: Cook, Jayce G, DO Primary Care Provider: Cook, Jayce G, DO Subjective  No chief complaint on file.   Assessment & Plan  Diagnoses and all orders for this visit:  Acquired hypothyroidism  Anxiety -     Metanephrines, plasma; Future -     Cortisol; Future -     ACTH; Future -     ACTH -     Cortisol -     Metanephrines, plasma  Chronic fatigue -     Metanephrines, plasma; Future -     Cortisol; Future -     ACTH; Future -     ACTH -     Cortisol -     Metanephrines, plasma    Nancy Williams is currently taking levothyroxine  50 mcg po every day, takes appropriately. Tried armour which did not help. Patient is currently biochemically euthyroid.  Educated on thyroid  axis.  Recommend the following: Take levothyroxine  50 mcg po every morning.  Advised to take levothyroxine  first thing in the morning on empty stomach and wait at least 30 minutes to 1 hour before eating or drinking anything or taking any other medications. Space out levothyroxine  by 4 hours from any acid reflux medication/fibrate/iron/calcium/multivitamin. Advised to take birth control pills and nutritional supplements in the evening. Repeat lab before next visit or sooner if symptoms of hyperthyroidism or hypothyroidism develop.  Notify us  immediately in case of pregnancy/breastfeeding or significant weight gain or loss. Counseled on compliance and follow up needs.  C/o nervous feeling in chest chest, has had to lay down due to lack of energy but patient now reports good energy Ordered baseline adrenal labs to r/o pheochromocytoma/adrenal insufficiency   I have reviewed current medications, nurse's notes, allergies, vital signs, past medical and surgical history, family medical history, and social history for this encounter. Counseled patient on symptoms, examination findings, lab  findings, imaging results, treatment decisions and monitoring and prognosis. The patient understood the recommendations and agrees with the treatment plan. All questions regarding treatment plan were fully answered.   Return in about 4 weeks (around 09/27/2023).   Obadiah Birmingham, MD  08/30/23   I have reviewed current medications, nurse's notes, allergies, vital signs, past medical and surgical history, family medical history, and social history for this encounter. Counseled patient on symptoms, examination findings, lab findings, imaging results, treatment decisions and monitoring and prognosis. The patient understood the recommendations and agrees with the treatment plan. All questions regarding treatment plan were fully answered.   History of Present Illness Nancy Williams is a 83 y.o. year old female who presents to our clinic for ervous/jittery feeling in chest since 2021.  Long term history of hypothyroidism since 39 yrs, taking levothyroxine  50 mcg po every day, takes appropriately  Has symptoms since 2021 C/o nervous/jittery feeling in chest that is hard to explain per patient, has had to lay down due to lack of energy but patient now reports good energy Been to neurologists, functional doctor's, cardiologists   Tried Zoloft  which did not help Tried armour which did not help Levothyroxine  50 mcg po every day  Physical Exam  BP 120/80   Pulse 84   Ht 5' 6 (1.676 m)   Wt 134 lb (60.8 kg)   SpO2 98%   BMI 21.63 kg/m  Constitutional: well developed, well nourished Head: normocephalic, atraumatic, no exophthalmos Eyes: sclera anicteric, no redness  Neck: no thyromegaly, no thyroid  tenderness; no nodules palpated Lungs: normal respiratory effort Neurology: alert and oriented, no fine hand tremor Skin: dry, no appreciable rashes Musculoskeletal: no appreciable defects Psychiatric: normal mood and affect  Allergies No Known Allergies  Current Medications Patient's  Medications  New Prescriptions   No medications on file  Previous Medications   B COMPLEX VITAMINS TABLET    Take 1 tablet by mouth daily.   CALCIUM CARB-CHOLECALCIFEROL (CALCIUM 500+D3 PO)    Take 1 tablet by mouth daily.   [employer] OMEGA-3 KRILL OIL PO    Take 1 capsule by mouth daily.   DIGESTIVE ENZYMES (DIGESTIVE SUPPORT PO)    Take 1 capsule by mouth daily.   LEVOTHYROXINE  (SYNTHROID ) 50 MCG TABLET    Take 1 tablet (50 mcg total) by mouth daily before breakfast.   MAGNESIUM GLYCINATE 120 MG CAPS    Take 1 capsule by mouth daily.   MULTIPLE VITAMINS-MINERALS (PRESERVISION AREDS 2) CAPS    Take 1 capsule by mouth in the morning and at bedtime.   OMEPRAZOLE  (PRILOSEC) 40 MG CAPSULE    Take 1 capsule (40 mg total) by mouth daily.   SUCRALFATE  (CARAFATE ) 1 G TABLET    Take 1 tablet (1 g total) by mouth 3 (three) times daily as needed.   VITAMIN D-VITAMIN K PO    Take 1 capsule by mouth daily.  Modified Medications   No medications on file  Discontinued Medications   No medications on file    Past Medical History Past Medical History:  Diagnosis Date   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    Hypothyroidism    Hashimoto's per [facility] New Patient Packet    Neuropathy    Osteoarthritis    Per patient at New patient appointment    Seasonal allergies    Vitamin D deficiency     Past Surgical History Past Surgical History:  Procedure Laterality Date   LEFT HEART CATH AND CORONARY ANGIOGRAPHY N/A 08/10/2023   Procedure: LEFT HEART CATH AND CORONARY ANGIOGRAPHY;  Surgeon: Swaziland, Peter M, MD;  Location: [facility] INVASIVE CV LAB;  Service: Cardiovascular;  Laterality: N/A;   No previous surgery      Family History family history includes Diabetes in her son; Hashimoto's thyroiditis in her daughter; Heart disease in her father, mother, and son; Heart failure in her mother; Prostate cancer in her father and son; Thyroid  disease in her son.  Social History Social History    Socioeconomic History   Marital status: Widowed    Spouse name: Not on file   Number of children: Not on file   Years of education: Not on file   Highest education level: 12th grade  Occupational History   Not on file  Tobacco Use   Smoking status: Never   Smokeless tobacco: Never  Vaping Use   Vaping status: Never Used  Substance and Sexual Activity   Alcohol use: Yes    Comment: Occasional, last drink 1-2 years ago as of 2021    Drug use: Never   Sexual activity: Not on file  Other Topics Concern   Not on file  Social History Narrative   Per [facility] New Patient Packet Abstracted on 08/19/2019:      Diet: Vegetarian, gluten free       Caffeine: Coffee & Tea      Married, if yes what year: Widow, 1959      Do you live in a house, apartment, assisted living, condo, trailer,  ect: House      Is it one or more stories: One stories, one person       Pets: 4 cats       Current/Past profession: Left Blank      Highest level or education completed: 12 th grade       Exercise:        Try          Type and how often: Need to clarify, unable to read what patient wrote          Living Will: Yes   DNR: Yes   POA/HPOA: Yes      Functional Status:   Do you have difficulty bathing or dressing yourself? No   Do you have difficulty preparing food or eating?No   Do you have difficulty managing your medications? No   Do you have difficulty managing your finances? No   Do you have difficulty affording your medications? No         Right Handed    Lives in a one story home    Social Drivers of Health   Financial Resource Strain: Low Risk  (08/23/2023)   Overall Financial Resource Strain (CARDIA)    Difficulty of Paying Living Expenses: Not hard at all  Food Insecurity: No Food Insecurity (08/23/2023)   Hunger Vital Sign    Worried About Running Out of Food in the Last Year: Never true    Ran Out of Food in the Last Year: Never true  Transportation Needs: No Transportation  Needs (08/23/2023)   PRAPARE - Administrator, Civil Service (Medical): No    Lack of Transportation (Non-Medical): No  Physical Activity: Insufficiently Active (08/23/2023)   Exercise Vital Sign    Days of Exercise per Week: 2 days    Minutes of Exercise per Session: 30 min  Stress: No Stress Concern Present (08/23/2023)   [employer] of Occupational Health - Occupational Stress Questionnaire    Feeling of Stress: Only a little  Social Connections: Moderately Isolated (08/23/2023)   Social Connection and Isolation Panel    Frequency of Communication with Friends and Family: More than three times a week    Frequency of Social Gatherings with Friends and Family: Twice a week    Attends Religious Services: Patient declined    Database administrator or Organizations: Yes    Attends Banker Meetings: More than 4 times per year    Marital Status: Widowed  Intimate Partner Violence: Not on file    Laboratory Investigations Lab Results  Component Value Date   TSH 2.400 07/05/2023   TSH 4.480 03/02/2023   TSH 5.820 (H) 10/10/2022   FREET4 1.23 07/05/2023   FREET4 1.4 10/27/2021     No results found for: TSI   No components found for: TRAB   Lab Results  Component Value Date   CHOL 243 (H) 03/02/2023   Lab Results  Component Value Date   HDL 94 03/02/2023   Lab Results  Component Value Date   LDLCALC 125 (H) 03/02/2023   Lab Results  Component Value Date   TRIG 142 03/02/2023   Lab Results  Component Value Date   CHOLHDL 2.6 03/02/2023   Lab Results  Component Value Date   CREATININE 0.82 08/03/2023   No results found for: GFR    Component Value Date/Time   NA 143 08/03/2023 1256   K 5.1 08/03/2023 1256   CL 106 08/03/2023 1256   CO2  21 08/03/2023 1256   GLUCOSE 86 08/03/2023 1256   GLUCOSE 100 (H) 03/31/2022 1332   BUN 25 08/03/2023 1256   CREATININE 0.82 08/03/2023 1256   CREATININE 0.71 03/31/2022 1332   CALCIUM 9.5  08/03/2023 1256   PROT 7.3 03/02/2023 1536   ALBUMIN 4.7 03/02/2023 1536   AST 25 03/02/2023 1536   ALT 15 03/02/2023 1536   ALKPHOS 92 03/02/2023 1536   BILITOT <0.2 03/02/2023 1536   GFRNONAA >60 02/12/2022 1154   GFRNONAA 69 05/20/2020 0943   GFRAA 80 05/20/2020 0943      Latest Ref Rng & Units 08/03/2023   12:56 PM 03/02/2023    3:36 PM 10/10/2022    4:37 PM  BMP  Glucose 70 - 99 mg/dL 86  77  83   BUN 8 - 27 mg/dL 25  22  24    Creatinine 0.57 - 1.00 mg/dL 9.17  9.22  9.31   BUN/Creat Ratio 12 - 28 30  29   35   Sodium 134 - 144 mmol/L 143  142  141   Potassium 3.5 - 5.2 mmol/L 5.1  4.9  4.8   Chloride 96 - 106 mmol/L 106  101  102   CO2 20 - 29 mmol/L 21  23  24    Calcium 8.7 - 10.3 mg/dL 9.5  89.7  9.4        Component Value Date/Time   WBC 7.7 08/03/2023 1257   WBC 8.3 03/31/2022 1332   RBC 4.55 08/03/2023 1257   RBC 4.33 03/31/2022 1332   HGB 13.6 08/03/2023 1257   HCT 42.8 08/03/2023 1257   PLT 218 08/03/2023 1257   MCV 94 08/03/2023 1257   MCH 29.9 08/03/2023 1257   MCH 29.8 03/31/2022 1332   MCHC 31.8 08/03/2023 1257   MCHC 33.4 03/31/2022 1332   RDW 13.4 08/03/2023 1257   LYMPHSABS 2.9 10/10/2022 1637   EOSABS 0.3 10/10/2022 1637   BASOSABS 0.0 10/10/2022 1637      Parts of this note may have been dictated using voice recognition software. There may be variances in spelling and vocabulary which are unintentional. Not all errors are proofread. Please notify the dino if any discrepancies are noted or if the meaning of any statement is not clear.

## 2023-09-01 DIAGNOSIS — F419 Anxiety disorder, unspecified: Secondary | ICD-10-CM | POA: Diagnosis not present

## 2023-09-01 DIAGNOSIS — R5382 Chronic fatigue, unspecified: Secondary | ICD-10-CM | POA: Diagnosis not present

## 2023-09-02 LAB — CORTISOL: Cortisol: 15.2 ug/dL (ref 6.2–19.4)

## 2023-09-04 LAB — ACTH: ACTH: 18.3 pg/mL (ref 7.2–63.3)

## 2023-09-06 ENCOUNTER — Encounter: Payer: Self-pay | Admitting: "Endocrinology

## 2023-09-06 ENCOUNTER — Encounter (INDEPENDENT_AMBULATORY_CARE_PROVIDER_SITE_OTHER): Payer: Self-pay | Admitting: *Deleted

## 2023-09-06 NOTE — Telephone Encounter (Signed)
 Lab Can not be added, I have called pt and informed pt. Lab has been faxed to [facility] in [place].

## 2023-09-07 ENCOUNTER — Other Ambulatory Visit: Payer: Self-pay | Admitting: "Endocrinology

## 2023-09-07 DIAGNOSIS — F419 Anxiety disorder, unspecified: Secondary | ICD-10-CM | POA: Diagnosis not present

## 2023-09-07 DIAGNOSIS — R5382 Chronic fatigue, unspecified: Secondary | ICD-10-CM | POA: Diagnosis not present

## 2023-09-07 LAB — METANEPHRINES, PLASMA
Metanephrine, Free: 27.6 pg/mL (ref 0.0–88.0)
Normetanephrine, Free: 97.7 pg/mL (ref 0.0–297.2)

## 2023-09-08 ENCOUNTER — Ambulatory Visit: Payer: Self-pay | Admitting: "Endocrinology

## 2023-09-11 ENCOUNTER — Ambulatory Visit: Admitting: Physician Assistant

## 2023-09-13 LAB — METANEPHRINES, PLASMA
Metanephrine, Free: <25 pg/mL (ref 0.0–88.0)
Normetanephrine, Free: 86.3 pg/mL (ref 0.0–297.2)

## 2023-09-19 ENCOUNTER — Encounter (INDEPENDENT_AMBULATORY_CARE_PROVIDER_SITE_OTHER): Payer: Self-pay | Admitting: Gastroenterology

## 2023-09-19 ENCOUNTER — Telehealth: Payer: Self-pay | Admitting: *Deleted

## 2023-09-19 ENCOUNTER — Encounter: Payer: Self-pay | Admitting: *Deleted

## 2023-09-19 ENCOUNTER — Ambulatory Visit (INDEPENDENT_AMBULATORY_CARE_PROVIDER_SITE_OTHER): Admitting: Gastroenterology

## 2023-09-19 VITALS — BP 113/73 | HR 74 | Temp 98.3°F | Ht 66.0 in | Wt 134.2 lb

## 2023-09-19 DIAGNOSIS — K5901 Slow transit constipation: Secondary | ICD-10-CM | POA: Insufficient documentation

## 2023-09-19 DIAGNOSIS — K5900 Constipation, unspecified: Secondary | ICD-10-CM | POA: Diagnosis not present

## 2023-09-19 DIAGNOSIS — R1013 Epigastric pain: Secondary | ICD-10-CM | POA: Diagnosis not present

## 2023-09-19 MED ORDER — POLYETHYLENE GLYCOL 3350 17 G PO PACK: 17.0000 g | PACK | Freq: Two times a day (BID) | ORAL | 0 refills | Status: AC

## 2023-09-19 NOTE — Progress Notes (Signed)
 Meggan Dhaliwal Faizan Emilia Kayes , M.D. Gastroenterology & Hepatology [facility] Gastroenterology [address] [place], [place] [address] Primary Care Physician: Bluford Jacqulyn MATSU, DO [address] [place] [address]  Chief Complaint: Epigastric pain, dyspepsia,  History of Present Illness: Nancy Williams is a 83 y.o. female with hypothyroidism, osteoarthritis who presents for evaluation of Epigastric pain, dyspepsia,altered bowel movements  Patient is accompanied by daughter in clinic.  Reports that her symptoms began 4 months ago where she would have epigastric discomfort without any food.  1 month ago severe epigastric pain woke patient up in the middle of the night.  Denies any NSAID use.  Patient has a bowel movement daily but does have occasional straining feels constipated at times.The patient denies having any fever, chills, hematochezia, melena, hematemesis, abdominal distention,  diarrhea, jaundice, pruritus or weight loss.  Negative Cologuard test 06/2023  Last hemoglobin 13.6 platelet 218  Cath: 08/2023     The left ventricular systolic function is normal.   LV end diastolic pressure is mildly elevated.   The left ventricular ejection fraction is 55-65% by visual estimate.   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    Hypothyroidism    Hashimoto's per [facility] New Patient Packet    Neuropathy    Osteoarthritis    Per patient at New patient appointment    Seasonal allergies    Vitamin D deficiency     Past Surgical History: Past Surgical History:  Procedure Laterality Date   LEFT HEART CATH AND CORONARY ANGIOGRAPHY  N/A 08/10/2023   Procedure: LEFT HEART CATH AND CORONARY ANGIOGRAPHY;  Surgeon: Swaziland, Peter M, MD;  Location: [facility] INVASIVE CV LAB;  Service: Cardiovascular;  Laterality: N/A;   No previous surgery      Family History: Family History  Problem Relation Age of Onset   Heart disease Mother    Heart failure Mother    Heart disease Father    Prostate cancer Father    Heart disease Son    Diabetes Son    Prostate cancer Son    Thyroid  disease Son    Hashimoto's thyroiditis Daughter     Social History: Social History   Tobacco Use  Smoking Status Never  Smokeless Tobacco Never   Social History   Substance and Sexual Activity  Alcohol Use Yes   Comment: Occasional, last drink 1-2 years ago as of 2021    Social History   Substance and Sexual Activity  Drug Use Never    Allergies: No Known Allergies  Medications: Current Outpatient Medications  Medication Sig Dispense Refill   b complex vitamins tablet Take 1 tablet by mouth daily.     Calcium Carb-Cholecalciferol (CALCIUM 500+D3 PO) Take 1 tablet by mouth daily.     CVS OMEGA-3 KRILL OIL PO Take 1 capsule by mouth daily.     Digestive Enzymes (DIGESTIVE SUPPORT PO) Take 1 capsule by mouth daily.     levothyroxine  (SYNTHROID ) 50 MCG tablet Take 1 tablet (50 mcg total) by mouth daily before breakfast. 90 tablet 1   Magnesium Glycinate 120 MG CAPS Take 1 capsule by mouth daily.     Multiple Vitamins-Minerals (PRESERVISION AREDS 2) CAPS Take 1 capsule by  mouth in the morning and at bedtime.     omeprazole  (PRILOSEC) 40 MG capsule Take 1 capsule (40 mg total) by mouth daily. 30 capsule 3   polyethylene glycol (MIRALAX  / GLYCOLAX ) 17 g packet Take 17 g by mouth 2 (two) times daily. 180 packet 0   sucralfate  (CARAFATE ) 1 g tablet Take 1 tablet (1 g total) by mouth 3 (three) times daily as needed. 30 tablet 1   VITAMIN D-VITAMIN K PO Take 1 capsule by mouth daily.     No current facility-administered medications for this visit.     Review of Systems: GENERAL: negative for malaise, night sweats HEENT: No changes in hearing or vision, no nose bleeds or other nasal problems. NECK: Negative for lumps, goiter, pain and significant neck swelling RESPIRATORY: Negative for cough, wheezing CARDIOVASCULAR: Negative for chest pain, leg swelling, palpitations, orthopnea GI: SEE HPI MUSCULOSKELETAL: Negative for joint pain or swelling, back pain, and muscle pain. SKIN: Negative for lesions, rash HEMATOLOGY Negative for prolonged bleeding, bruising easily, and swollen nodes. ENDOCRINE: Negative for cold or heat intolerance, polyuria, polydipsia and goiter. NEURO: negative for tremor, gait imbalance, syncope and seizures. The remainder of the review of systems is noncontributory.   Physical Exam: BP 113/73   Pulse 74   Temp 98.3 F (36.8 C)   Ht 5' 6 (1.676 m)   Wt 134 lb 3.2 oz (60.9 kg)   BMI 21.66 kg/m  GENERAL: The patient is AO x3, in no acute distress. HEENT: Head is normocephalic and atraumatic. EOMI are intact. Mouth is well hydrated and without lesions. NECK: Supple. No masses LUNGS: Clear to auscultation. No presence of rhonchi/wheezing/rales. Adequate chest expansion HEART: RRR, normal s1 and s2. ABDOMEN: Soft, nontender, no guarding, no peritoneal signs, and nondistended. BS +. No masses.  Imaging/Labs: as above     Latest Ref Rng & Units 08/03/2023   12:57 PM 03/02/2023    3:36 PM 10/10/2022    4:37 PM  CBC  WBC 3.4 - 10.8 x10E3/uL 7.7  8.4  8.4   Hemoglobin 11.1 - 15.9 g/dL 86.3  85.5  86.5   Hematocrit 34.0 - 46.6 % 42.8  43.5  40.6   Platelets 150 - 450 x10E3/uL 218  293  285    Lab Results  Component Value Date   IRON 130 10/27/2021   TIBC 200 (L) 10/27/2021   FERRITIN 72 10/27/2021    I personally reviewed and interpreted the available labs, imaging and endoscopic files.  Impression and Plan:  Nancy Williams is a 83 y.o. female with hypothyroidism, osteoarthritis who presents for  evaluation of Epigastric pain, dyspepsia,altered bowel movements  #Dyspepsia #Altered bowel movements  Patient has new onset epigastric discomfort, has not responded to adequate trial of PPI.  This is dyspepsia  As per ACG guideline, patient above the age of 85 with new onset dyspepsia upper endoscopy is indicated to rule out any underlying lesion  Since patient had nocturnal pain which is an alarm symptom as well I would obtain cross-sectional imaging to evaluate for any pancreatobiliary pathology  Bowels are altered mostly with constipation side  Ensure adequate fluid intake: Aim for 8 glasses of water daily. Follow a high fiber diet: Include foods such as dates, prunes, pears, and kiwi. Take Miralax  twice a day for the first week, then reduce to once daily thereafter.  Can continue PPI for now  CT abdomen pelvis with IV contrast  Upper endoscopy with biopsies  I thoroughly discussed with the patient  the procedure, including the risks involved. Patient understands what the procedure involves including the benefits and any risks. Patient understands alternatives to the proposed procedure. Risks including (but not limited to) bleeding, tearing of the lining (perforation), rupture of adjacent organs, problems with heart and lung function, infection, and medication reactions. A small percentage of complications may require surgery, hospitalization, repeat endoscopic procedure, and/or transfusion.  Patient understood and agreed.   All questions were answered.      Leyli Kevorkian Faizan Wadie Liew, MD Gastroenterology and Hepatology [facility] Gastroenterology   This chart has been completed using [facility] Dictation software, and while attempts have been made to ensure accuracy , certain words and phrases may not be transcribed as intended

## 2023-09-19 NOTE — Telephone Encounter (Signed)
 [employer] PA for CT: Authorization Number: [id number] Case Number: [id number] Review Date: 09/19/2023 11:54:56 AM Expiration Date: 03/17/2024 Status: Your case has been Approved. The prior authorization you submitted, Case [id number], has been received. Additional case status notifications will be sent if you opted in for email notifications. Thank you.

## 2023-09-19 NOTE — Patient Instructions (Signed)
 It was very nice to meet you today, as dicussed with will plan for the following :  1)Ensure adequate fluid intake: Aim for 8 glasses of water daily. Follow a high fiber diet: Include foods such as dates, prunes, pears, and kiwi. Take Miralax  twice a day for the first week, then reduce to once daily thereafter.  2) Ct scan  3) upper endoscopy

## 2023-09-19 NOTE — H&P (View-Only) (Signed)
 Nancy Williams , M.D. Gastroenterology & Hepatology [facility] Gastroenterology [address] [place], [place] [address] Primary Care Physician: Bluford Jacqulyn MATSU, DO [address] [place] [address]  Chief Complaint: Epigastric pain, dyspepsia,  History of Present Illness: Nancy Williams is a 83 y.o. female with hypothyroidism, osteoarthritis who presents for evaluation of Epigastric pain, dyspepsia,altered bowel movements  Patient is accompanied by daughter in clinic.  Reports that her symptoms began 4 months ago where she would have epigastric discomfort without any food.  1 month ago severe epigastric pain woke patient up in the middle of the night.  Denies any NSAID use.  Patient has a bowel movement daily but does have occasional straining feels constipated at times.The patient denies having any fever, chills, hematochezia, melena, hematemesis, abdominal distention,  diarrhea, jaundice, pruritus or weight loss.  Negative Cologuard test 06/2023  Last hemoglobin 13.6 platelet 218  Cath: 08/2023     The left ventricular systolic function is normal.   LV end diastolic pressure is mildly elevated.   The left ventricular ejection fraction is 55-65% by visual estimate.   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    Hypothyroidism    Hashimoto's per [facility] New Patient Packet    Neuropathy    Osteoarthritis    Per patient at New patient appointment    Seasonal allergies    Vitamin D deficiency     Past Surgical History: Past Surgical History:  Procedure Laterality Date   LEFT HEART CATH AND CORONARY ANGIOGRAPHY  N/A 08/10/2023   Procedure: LEFT HEART CATH AND CORONARY ANGIOGRAPHY;  Surgeon: [place], Peter M, MD;  Location: [facility] INVASIVE CV LAB;  Service: Cardiovascular;  Laterality: N/A;   No previous surgery      Family History: Family History  Problem Relation Age of Onset   Heart disease Mother    Heart failure Mother    Heart disease Father    Prostate cancer Father    Heart disease Son    Diabetes Son    Prostate cancer Son    Thyroid  disease Son    Hashimoto's thyroiditis Daughter     Social History: Social History   Tobacco Use  Smoking Status Never  Smokeless Tobacco Never   Social History   Substance and Sexual Activity  Alcohol Use Yes   Comment: Occasional, last drink 1-2 years ago as of 2021    Social History   Substance and Sexual Activity  Drug Use Never    Allergies: No Known Allergies  Medications: Current Outpatient Medications  Medication Sig Dispense Refill   b complex vitamins tablet Take 1 tablet by mouth daily.     Calcium Carb-Cholecalciferol (CALCIUM 500+D3 PO) Take 1 tablet by mouth daily.     [employer] OMEGA-3 KRILL OIL PO Take 1 capsule by mouth daily.     Digestive Enzymes (DIGESTIVE SUPPORT PO) Take 1 capsule by mouth daily.     levothyroxine  (SYNTHROID ) 50 MCG tablet Take 1 tablet (50 mcg total) by mouth daily before breakfast. 90 tablet 1   Magnesium Glycinate 120 MG CAPS Take 1 capsule by mouth daily.     Multiple Vitamins-Minerals (PRESERVISION AREDS 2) CAPS Take 1 capsule by  mouth in the morning and at bedtime.     omeprazole  (PRILOSEC) 40 MG capsule Take 1 capsule (40 mg total) by mouth daily. 30 capsule 3   polyethylene glycol (MIRALAX  / GLYCOLAX ) 17 g packet Take 17 g by mouth 2 (two) times daily. 180 packet 0   sucralfate  (CARAFATE ) 1 g tablet Take 1 tablet (1 g total) by mouth 3 (three) times daily as needed. 30 tablet 1   VITAMIN D-VITAMIN K PO Take 1 capsule by mouth daily.     No current facility-administered medications for this visit.     Review of Systems: GENERAL: negative for malaise, night sweats HEENT: No changes in hearing or vision, no nose bleeds or other nasal problems. NECK: Negative for lumps, goiter, pain and significant neck swelling RESPIRATORY: Negative for cough, wheezing CARDIOVASCULAR: Negative for chest pain, leg swelling, palpitations, orthopnea GI: SEE HPI MUSCULOSKELETAL: Negative for joint pain or swelling, back pain, and muscle pain. SKIN: Negative for lesions, rash HEMATOLOGY Negative for prolonged bleeding, bruising easily, and swollen nodes. ENDOCRINE: Negative for cold or heat intolerance, polyuria, polydipsia and goiter. NEURO: negative for tremor, gait imbalance, syncope and seizures. The remainder of the review of systems is noncontributory.   Physical Exam: BP 113/73   Pulse 74   Temp 98.3 F (36.8 C)   Ht 5' 6 (1.676 m)   Wt 134 lb 3.2 oz (60.9 kg)   BMI 21.66 kg/m  GENERAL: The patient is AO x3, in no acute distress. HEENT: Head is normocephalic and atraumatic. EOMI are intact. Mouth is well hydrated and without lesions. NECK: Supple. No masses LUNGS: Clear to auscultation. No presence of rhonchi/wheezing/rales. Adequate chest expansion HEART: RRR, normal s1 and s2. ABDOMEN: Soft, nontender, no guarding, no peritoneal signs, and nondistended. BS +. No masses.  Imaging/Labs: as above     Latest Ref Rng & Units 08/03/2023   12:57 PM 03/02/2023    3:36 PM 10/10/2022    4:37 PM  CBC  WBC 3.4 - 10.8 x10E3/uL 7.7  8.4  8.4   Hemoglobin 11.1 - 15.9 g/dL 86.3  85.5  86.5   Hematocrit 34.0 - 46.6 % 42.8  43.5  40.6   Platelets 150 - 450 x10E3/uL 218  293  285    Lab Results  Component Value Date   IRON 130 10/27/2021   TIBC 200 (L) 10/27/2021   FERRITIN 72 10/27/2021    I personally reviewed and interpreted the available labs, imaging and endoscopic files.  Impression and Plan:  [name] is a 83 y.o. female with hypothyroidism, osteoarthritis who presents for  evaluation of Epigastric pain, dyspepsia,altered bowel movements  #Dyspepsia #Altered bowel movements  Patient has new onset epigastric discomfort, has not responded to adequate trial of PPI.  This is dyspepsia  As per ACG guideline, patient above the age of 85 with new onset dyspepsia upper endoscopy is indicated to rule out any underlying lesion  Since patient had nocturnal pain which is an alarm symptom as well I would obtain cross-sectional imaging to evaluate for any pancreatobiliary pathology  Bowels are altered mostly with constipation side  Ensure adequate fluid intake: Aim for 8 glasses of water daily. Follow a high fiber diet: Include foods such as dates, prunes, pears, and kiwi. Take Miralax  twice a day for the first week, then reduce to once daily thereafter.  Can continue PPI for now  CT abdomen pelvis with IV contrast  Upper endoscopy with biopsies  I thoroughly discussed with the patient  the procedure, including the risks involved. Patient understands what the procedure involves including the benefits and any risks. Patient understands alternatives to the proposed procedure. Risks including (but not limited to) bleeding, tearing of the lining (perforation), rupture of adjacent organs, problems with heart and lung function, infection, and medication reactions. A small percentage of complications may require surgery, hospitalization, repeat endoscopic procedure, and/or transfusion.  Patient understood and agreed.   All questions were answered.      Leyli Kevorkian Faizan Wadie Liew, MD Gastroenterology and Hepatology [facility] Gastroenterology   This chart has been completed using [facility] Dictation software, and while attempts have been made to ensure accuracy , certain words and phrases may not be transcribed as intended

## 2023-09-20 ENCOUNTER — Ambulatory Visit: Admitting: Cardiology

## 2023-09-25 ENCOUNTER — Ambulatory Visit (INDEPENDENT_AMBULATORY_CARE_PROVIDER_SITE_OTHER): Admitting: Gastroenterology

## 2023-09-26 ENCOUNTER — Encounter (HOSPITAL_COMMUNITY): Payer: Self-pay | Admitting: Gastroenterology

## 2023-09-26 ENCOUNTER — Encounter (INDEPENDENT_AMBULATORY_CARE_PROVIDER_SITE_OTHER): Payer: Self-pay | Admitting: *Deleted

## 2023-09-26 ENCOUNTER — Other Ambulatory Visit: Payer: Self-pay

## 2023-09-26 ENCOUNTER — Ambulatory Visit (HOSPITAL_BASED_OUTPATIENT_CLINIC_OR_DEPARTMENT_OTHER): Admitting: Anesthesiology

## 2023-09-26 ENCOUNTER — Ambulatory Visit (HOSPITAL_COMMUNITY): Admitting: Anesthesiology

## 2023-09-26 ENCOUNTER — Ambulatory Visit (HOSPITAL_COMMUNITY)
Admission: RE | Admit: 2023-09-26 | Discharge: 2023-09-26 | Disposition: A | Discharge status: Home / Self Care | Attending: Gastroenterology | Admitting: Gastroenterology

## 2023-09-26 ENCOUNTER — Encounter (HOSPITAL_COMMUNITY)
Admission: RE | Disposition: A | Discharge status: Home / Self Care | Payer: Self-pay | Source: Home / Self Care | Attending: Gastroenterology

## 2023-09-26 DIAGNOSIS — R1013 Epigastric pain: Secondary | ICD-10-CM | POA: Diagnosis not present

## 2023-09-26 DIAGNOSIS — K449 Diaphragmatic hernia without obstruction or gangrene: Secondary | ICD-10-CM | POA: Diagnosis not present

## 2023-09-26 DIAGNOSIS — I10 Essential (primary) hypertension: Secondary | ICD-10-CM | POA: Diagnosis not present

## 2023-09-26 DIAGNOSIS — K3189 Other diseases of stomach and duodenum: Secondary | ICD-10-CM | POA: Insufficient documentation

## 2023-09-26 DIAGNOSIS — Z8249 Family history of ischemic heart disease and other diseases of the circulatory system: Secondary | ICD-10-CM | POA: Insufficient documentation

## 2023-09-26 DIAGNOSIS — K2970 Gastritis, unspecified, without bleeding: Secondary | ICD-10-CM

## 2023-09-26 HISTORY — PX: ESOPHAGOGASTRODUODENOSCOPY: SHX5428

## 2023-09-26 LAB — HM COLONOSCOPY

## 2023-09-26 SURGERY — EGD (ESOPHAGOGASTRODUODENOSCOPY)
Anesthesia: General

## 2023-09-26 MED ORDER — LACTATED RINGERS IV SOLN
INTRAVENOUS | Status: DC | PRN
Start: 2023-09-26 — End: 2023-09-26

## 2023-09-26 MED ORDER — LIDOCAINE 2% (20 MG/ML) 5 ML SYRINGE
INTRAMUSCULAR | Status: DC | PRN
  Administered 2023-09-26: 50 mg via INTRAVENOUS

## 2023-09-26 MED ORDER — PROPOFOL 10 MG/ML IV BOLUS
INTRAVENOUS | Status: DC | PRN
  Administered 2023-09-26: 100 mg via INTRAVENOUS
  Administered 2023-09-26: 50 mg via INTRAVENOUS
  Administered 2023-09-26: 20 mg via INTRAVENOUS

## 2023-09-26 NOTE — Interval H&P Note (Signed)
 History and Physical Interval Note:  09/26/2023 8:47 AM  Nancy Williams  has presented today for surgery, with the diagnosis of dyspepsia.  The various methods of treatment have been discussed with the patient and family. After consideration of risks, benefits and other options for treatment, the patient has consented to  Procedure(s) with comments: EGD (ESOPHAGOGASTRODUODENOSCOPY) (N/A) - 9:45 am, asa 1-2 as a surgical intervention.  The patient's history has been reviewed, patient examined, no change in status, stable for surgery.  I have reviewed the patient's chart and labs.  Questions were answered to the patient's satisfaction.     Nancy Williams

## 2023-09-26 NOTE — Transfer of Care (Signed)
 Immediate Anesthesia Transfer of Care Note  Patient: Nancy Williams  Procedure(s) Performed: EGD (ESOPHAGOGASTRODUODENOSCOPY)  Patient Location: PACU and Endoscopy Unit  Anesthesia Type:General  Level of Consciousness: awake  Airway & Oxygen Therapy: Patient Spontanous Breathing  Post-op Assessment: Report given to RN  Post vital signs: Reviewed and stable  Last Vitals:  Vitals Value Taken Time  BP    Temp    Pulse    Resp    SpO2      Last Pain:  Vitals:   09/26/23 0928  TempSrc:   PainSc: 0-No pain      Patients Stated Pain Goal: 6 (09/26/23 0912)  Complications: No notable events documented.

## 2023-09-26 NOTE — Anesthesia Preprocedure Evaluation (Signed)
 Anesthesia Evaluation  Patient identified by MRN, date of birth, ID band Patient awake    Reviewed: Allergy & Precautions, H&P , NPO status , Patient's Chart, lab work & pertinent test results, reviewed documented beta blocker date and time   Airway Mallampati: II  TM Distance: >3 FB Neck ROM: full    Dental no notable dental hx.    Pulmonary neg pulmonary ROS   Pulmonary exam normal breath sounds clear to auscultation       Cardiovascular Exercise Tolerance: Good hypertension, negative cardio ROS  Rhythm:regular Rate:Normal     Neuro/Psych   Anxiety     negative neurological ROS  negative psych ROS   GI/Hepatic negative GI ROS, Neg liver ROS,,,  Endo/Other  Hypothyroidism    Renal/GU negative Renal ROS  negative genitourinary   Musculoskeletal   Abdominal   Peds  Hematology negative hematology ROS (+)   Anesthesia Other Findings   Reproductive/Obstetrics negative OB ROS                              Anesthesia Physical Anesthesia Plan  ASA: 2  Anesthesia Plan: General   Post-op Pain Management:    Induction:   PONV Risk Score and Plan: Propofol  infusion  Airway Management Planned:   Additional Equipment:   Intra-op Plan:   Post-operative Plan:   Informed Consent: I have reviewed the patients History and Physical, chart, labs and discussed the procedure including the risks, benefits and alternatives for the proposed anesthesia with the patient or authorized representative who has indicated his/her understanding and acceptance.     Dental Advisory Given  Plan Discussed with: CRNA  Anesthesia Plan Comments:         Anesthesia Quick Evaluation

## 2023-09-26 NOTE — Anesthesia Postprocedure Evaluation (Signed)
 Anesthesia Post Note  Patient: Nancy Williams  Procedure(s) Performed: EGD (ESOPHAGOGASTRODUODENOSCOPY)  Patient location during evaluation: Endoscopy Anesthesia Type: General Level of consciousness: awake and alert Pain management: pain level controlled Vital Signs Assessment: post-procedure vital signs reviewed and stable Respiratory status: spontaneous breathing Cardiovascular status: blood pressure returned to baseline and stable Postop Assessment: no apparent nausea or vomiting Anesthetic complications: no   No notable events documented.   Last Vitals:  Vitals:   09/26/23 0946 09/26/23 0949  BP: (!) 106/52 (!) 128/58  Pulse: 64   Resp: 20   Temp:    SpO2: 96%     Last Pain:  Vitals:   09/26/23 0949  TempSrc:   PainSc: 0-No pain                 Noreen Mackintosh

## 2023-09-26 NOTE — Discharge Instructions (Signed)

## 2023-09-26 NOTE — Op Note (Signed)
 [facility] Patient Name: Nancy Williams Procedure Date: 09/26/2023 9:12 AM MRN: [id number] Date of Birth: 1940-07-20 Attending MD: Deatrice Dine , MD, [id number] CSN: [id number] Age: 83 Admit Type: Outpatient Procedure:                Upper GI endoscopy Indications:              Dyspepsia Providers:                Deatrice Dine, MD, Jon LABOR. Museum/gallery exhibitions officer, Charity fundraiser,                            Mickel CROME. Shirlean Balm, Technician Referring MD:              Medicines:                Monitored Anesthesia Care Complications:            No immediate complications. Estimated Blood Loss:     Estimated blood loss was minimal. Procedure:                Pre-Anesthesia Assessment:                           - Prior to the procedure, a History and Physical                            was performed, and patient medications and                            allergies were reviewed. The patient's tolerance of                            previous anesthesia was also reviewed. The risks                            and benefits of the procedure and the sedation                            options and risks were discussed with the patient.                            All questions were answered, and informed consent                            was obtained. Prior Anticoagulants: The patient has                            taken no anticoagulant or antiplatelet agents. ASA                            Grade Assessment: II - A patient with mild systemic                            disease. After reviewing the risks and benefits,  the patient was deemed in satisfactory condition to                            undergo the procedure.                           After obtaining informed consent, the endoscope was                            passed under direct vision. Throughout the                            procedure, the patient's blood pressure, pulse, and                            oxygen saturations  were monitored continuously. The                            [id number] ([id number]) Upper was introduced through                            the mouth, and advanced to the second part of                            duodenum. The upper GI endoscopy was accomplished                            without difficulty. The patient tolerated the                            procedure well. Scope In: 9:33:00 AM Scope Out: 9:35:41 AM Total Procedure Duration: 0 hours 2 minutes 41 seconds  Findings:      The examined esophagus was normal.      A 2 cm hiatal hernia was present.      Mildly erythematous mucosa without bleeding was found in the gastric       antrum. Biopsies were taken with a cold forceps for histology.      The duodenal bulb and second portion of the duodenum were normal. Impression:               - Normal esophagus.                           - 2 cm hiatal hernia.                           - Erythematous mucosa in the antrum. Biopsied.                           - Normal duodenal bulb and second portion of the                            duodenum. Moderate Sedation:      Per Anesthesia Care Recommendation:           - Patient has a contact number available for  emergencies. The signs and symptoms of potential                            delayed complications were discussed with the                            patient. Return to normal activities tomorrow.                            Written discharge instructions were provided to the                            patient.                           - Resume previous diet.                           - Continue present medications.                           - Await pathology results.                           -Obtain cross sectional imaging as scheduled Procedure Code(s):        --- Professional ---                           [id number], Esophagogastroduodenoscopy, flexible,                            transoral; with biopsy,  single or multiple Diagnosis Code(s):        --- Professional ---                           K44.9, Diaphragmatic hernia without obstruction or                            gangrene                           K31.89, Other diseases of stomach and duodenum                           R10.13, Epigastric pain CPT copyright 2022 American Medical Association. All rights reserved. The codes documented in this report are preliminary and upon coder review may  be revised to meet current compliance requirements. Deatrice Dine, MD Deatrice Dine, MD 09/26/2023 9:39:17 AM This report has been signed electronically. Number of Addenda: 0

## 2023-09-27 ENCOUNTER — Telehealth: Admitting: "Endocrinology

## 2023-09-27 LAB — SURGICAL PATHOLOGY

## 2023-09-28 ENCOUNTER — Ambulatory Visit (INDEPENDENT_AMBULATORY_CARE_PROVIDER_SITE_OTHER): Payer: Self-pay | Admitting: Gastroenterology

## 2023-09-28 ENCOUNTER — Ambulatory Visit (INDEPENDENT_AMBULATORY_CARE_PROVIDER_SITE_OTHER): Admitting: Gastroenterology

## 2023-09-28 ENCOUNTER — Encounter (HOSPITAL_COMMUNITY): Payer: Self-pay | Admitting: Gastroenterology

## 2023-10-09 NOTE — Progress Notes (Signed)
 Patient result letter mailed Patient's PCP is on EPIC

## 2023-10-10 ENCOUNTER — Ambulatory Visit (HOSPITAL_COMMUNITY)
Admission: RE | Admit: 2023-10-10 | Discharge: 2023-10-10 | Disposition: A | Discharge status: Home / Self Care | Source: Ambulatory Visit | Attending: Gastroenterology | Admitting: Gastroenterology

## 2023-10-10 DIAGNOSIS — K5730 Diverticulosis of large intestine without perforation or abscess without bleeding: Secondary | ICD-10-CM | POA: Diagnosis not present

## 2023-10-10 DIAGNOSIS — R1013 Epigastric pain: Secondary | ICD-10-CM | POA: Diagnosis not present

## 2023-10-10 DIAGNOSIS — N281 Cyst of kidney, acquired: Secondary | ICD-10-CM | POA: Diagnosis not present

## 2023-10-10 DIAGNOSIS — R1010 Upper abdominal pain, unspecified: Secondary | ICD-10-CM | POA: Diagnosis not present

## 2023-10-10 DIAGNOSIS — K8020 Calculus of gallbladder without cholecystitis without obstruction: Secondary | ICD-10-CM | POA: Diagnosis not present

## 2023-10-10 MED ORDER — IOHEXOL 300 MG/ML  SOLN
100.0000 mL | Freq: Once | INTRAMUSCULAR | Status: AC | PRN
  Administered 2023-10-10: 100 mL via INTRAVENOUS

## 2023-10-17 ENCOUNTER — Ambulatory Visit (INDEPENDENT_AMBULATORY_CARE_PROVIDER_SITE_OTHER): Payer: Self-pay | Admitting: Gastroenterology

## 2023-10-17 NOTE — Progress Notes (Signed)
 Hi Tanya ,  Can you please call the patient and tell the patient the CT scan shows gallstones which in some cases can cause abdominal pain.  In most cases I send the patient to the surgeons to discuss options; let us  know if this is something you would like to pursue.  Good news pancreas , spleen and liver were reported to be normal. also shows small to moderate stool in the colon which can also cause abdominal discomfort.  I recommend taking MiraLAX  daily  Thanks,  Jurni Cesaro Faizan Bernadine Melecio, MD Gastroenterology and Hepatology [facility] Gastroenterology =============  IMPRESSION: 1. No acute findings. 2. Cholelithiasis without superimposed pericholecystic inflammatory change. 3. Moderate sigmoid diverticulosis without superimposed acute inflammatory change. 4. Small to moderate stool burden.

## 2023-10-18 ENCOUNTER — Ambulatory Visit (INDEPENDENT_AMBULATORY_CARE_PROVIDER_SITE_OTHER): Admitting: Gastroenterology

## 2023-10-18 ENCOUNTER — Encounter (INDEPENDENT_AMBULATORY_CARE_PROVIDER_SITE_OTHER): Payer: Self-pay | Admitting: Gastroenterology

## 2023-10-18 VITALS — BP 129/74 | HR 74 | Temp 96.8°F | Ht 66.0 in | Wt 134.0 lb

## 2023-10-18 DIAGNOSIS — K8020 Calculus of gallbladder without cholecystitis without obstruction: Secondary | ICD-10-CM | POA: Diagnosis not present

## 2023-10-18 DIAGNOSIS — K5641 Fecal impaction: Secondary | ICD-10-CM | POA: Diagnosis not present

## 2023-10-18 DIAGNOSIS — K5730 Diverticulosis of large intestine without perforation or abscess without bleeding: Secondary | ICD-10-CM | POA: Diagnosis not present

## 2023-10-18 DIAGNOSIS — K30 Functional dyspepsia: Secondary | ICD-10-CM

## 2023-10-18 DIAGNOSIS — R109 Unspecified abdominal pain: Secondary | ICD-10-CM | POA: Diagnosis not present

## 2023-10-18 DIAGNOSIS — K581 Irritable bowel syndrome with constipation: Secondary | ICD-10-CM | POA: Insufficient documentation

## 2023-10-18 DIAGNOSIS — R1013 Epigastric pain: Secondary | ICD-10-CM

## 2023-10-18 DIAGNOSIS — K8050 Calculus of bile duct without cholangitis or cholecystitis without obstruction: Secondary | ICD-10-CM | POA: Insufficient documentation

## 2023-10-18 MED ORDER — LINACLOTIDE 72 MCG PO CAPS: 72.0000 ug | ORAL_CAPSULE | Freq: Every day | ORAL | Status: DC

## 2023-10-18 NOTE — Progress Notes (Signed)
 Naida Escalante Faizan Meenakshi Sazama , M.D. Gastroenterology & Hepatology [facility] Gastroenterology [address] [place], [place] [address] Primary Care Physician: Bluford Jacqulyn MATSU, DO [address] [place] [address]  Chief Complaint: Abdominal pain, CT finding cholelithiasis  History of Present Illness: Nancy Williams is a 83 y.o. female with hypothyroidism, osteoarthritis who presents for evaluation of Abdominal pain, CT finding cholelithiasis  Patient is accompanied by daughter in clinic.  Patient was seen last month in the clinic where she underwent upper endoscopy which was essentially negative and CT abdomen pelvis demonstrating cholelithiasis and diverticulosis  Patient coming in today concerned about the findings and to discuss further about the CT findings.  She continues to report abdominal pain located diffusely.  No relationship with food intake and she woke up today with the pain.  Patient bowel movements is more on constipation side where she will have bowel movement every other day.  Patient is a vegetarian and eats plenty of vegetables and fruits  EGD 09/2023  - Normal esophagus. - 2 cm hiatal hernia. - Erythematous mucosa in the antrum. Biopsied. - Normal duodenal bulb and second portion of the duodenum.  A. STOMACH BIOPSY:       Gastric antral/oxyntic mucosa with no significant diagnostic  alteration.  No H. pylori identified on HE stain.  Negative for intestinal metaplasia or dysplasia.    Negative Cologuard test 06/2023  Last hemoglobin 13.6 platelet 218  Cath: 08/2023     The left ventricular systolic function is normal.   LV end diastolic pressure is mildly elevated.   The left ventricular ejection fraction is 55-65% by visual estimate.   Normal coronary anatomy Normal LV function Normal LVEDP    FHx: neg for any gastrointestinal/liver disease, no malignancies Social: neg smoking, alcohol or illicit drug use Surgical: no abdominal  surgeries  Past Medical History: Past Medical History:  Diagnosis Date   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    Hypothyroidism    Hashimoto's per [facility] New Patient Packet    Neuropathy    Osteoarthritis    Per patient at New patient appointment    Seasonal allergies    Vitamin D deficiency     Past Surgical History: Past Surgical History:  Procedure Laterality Date   ESOPHAGOGASTRODUODENOSCOPY N/A 09/26/2023   Procedure: EGD (ESOPHAGOGASTRODUODENOSCOPY);  Surgeon: Cinderella Deatrice FALCON, MD;  Location[facility] SU[facility];  Service: Endoscopy;  Laterality: N/A;  9:45 am, asa 1-2   LEFT HEART CATH AND CORONARY ANGIOGRAPHY N/A 08/10/2023   Procedure: LEFT HEART CATH AND CORONARY ANGIOGRAPHY;  Surgeon: [place], Peter M, MD;  Location: [facility] INVASIVE CV LAB;  Service: Cardiovascular;  Laterality: N/A;   No previous surgery      Family History: Family History  Problem Relation Age of Onset   Heart disease Mother    Heart failure Mother    Heart disease Father    Prostate cancer Father    Heart disease Son    Diabetes Son    Prostate cancer Son    Thyroid  disease Son    Hashimoto's thyroiditis Daughter     Social History: Social History   Tobacco Use  Smoking Status Never  Smokeless Tobacco Never   Social History   Substance and Sexual Activity  Alcohol Use Yes   Comment: occasional -maybe a couple times per year-glass of wine   Social History   Substance and Sexual Activity  Drug Use Never    Allergies: No Known  Allergies  Medications: Current Outpatient Medications  Medication Sig Dispense Refill   b complex vitamins tablet Take 1 tablet by mouth daily.     Calcium Carb-Cholecalciferol (CALCIUM 500+D3 PO) Take 1 tablet by mouth daily.     [employer] OMEGA-3 KRILL OIL PO Take 1 capsule by mouth daily.     Digestive Enzymes (DIGESTIVE SUPPORT PO) Take 1 capsule by mouth daily.     levothyroxine  (SYNTHROID ) 50 MCG tablet Take 1 tablet (50 mcg total) by  mouth daily before breakfast. 90 tablet 1   Magnesium Glycinate 120 MG CAPS Take 1 capsule by mouth daily.     Multiple Vitamins-Minerals (PRESERVISION AREDS 2) CAPS Take 1 capsule by mouth in the morning and at bedtime.     omeprazole  (PRILOSEC) 40 MG capsule Take 1 capsule (40 mg total) by mouth daily. 30 capsule 3   polyethylene glycol (MIRALAX  / GLYCOLAX ) 17 g packet Take 17 g by mouth 2 (two) times daily. 180 packet 0   sucralfate  (CARAFATE ) 1 g tablet Take 1 tablet (1 g total) by mouth 3 (three) times daily as needed. 30 tablet 1   VITAMIN D-VITAMIN K PO Take 1 capsule by mouth daily.     No current facility-administered medications for this visit.    Review of Systems: GENERAL: negative for malaise, night sweats HEENT: No changes in hearing or vision, no nose bleeds or other nasal problems. NECK: Negative for lumps, goiter, pain and significant neck swelling RESPIRATORY: Negative for cough, wheezing CARDIOVASCULAR: Negative for chest pain, leg swelling, palpitations, orthopnea GI: SEE HPI MUSCULOSKELETAL: Negative for joint pain or swelling, back pain, and muscle pain. SKIN: Negative for lesions, rash HEMATOLOGY Negative for prolonged bleeding, bruising easily, and swollen nodes. ENDOCRINE: Negative for cold or heat intolerance, polyuria, polydipsia and goiter. NEURO: negative for tremor, gait imbalance, syncope and seizures. The remainder of the review of systems is noncontributory.   Physical Exam: BP 129/74   Pulse 74   Temp (!) 96.8 F (36 C)   Ht 5' 6 (1.676 m)   Wt 134 lb (60.8 kg)   BMI 21.63 kg/m  GENERAL: The patient is AO x3, in no acute distress. HEENT: Head is normocephalic and atraumatic. EOMI are intact. Mouth is well hydrated and without lesions. NECK: Supple. No masses LUNGS: Clear to auscultation. No presence of rhonchi/wheezing/rales. Adequate chest expansion HEART: RRR, normal s1 and s2. ABDOMEN: Soft, nontender, no guarding, no peritoneal signs, and  nondistended. BS +. No masses.  Imaging/Labs: as above     Latest Ref Rng & Units 08/03/2023   12:57 PM 03/02/2023    3:36 PM 10/10/2022    4:37 PM  CBC  WBC 3.4 - 10.8 x10E3/uL 7.7  8.4  8.4   Hemoglobin 11.1 - 15.9 g/dL 86.3  85.5  86.5   Hematocrit 34.0 - 46.6 % 42.8  43.5  40.6   Platelets 150 - 450 x10E3/uL 218  293  285    Lab Results  Component Value Date   IRON 130 10/27/2021   TIBC 200 (L) 10/27/2021   FERRITIN 72 10/27/2021    I personally reviewed and interpreted the available labs, imaging and endoscopic files.  CT abdomen and Pelvis  IMPRESSION: 1. No acute findings. 2. Cholelithiasis without superimposed pericholecystic inflammatory change. 3. Moderate sigmoid diverticulosis without superimposed acute inflammatory change. 4. Small to moderate stool burden.  Impression and Plan:  SHECCID LAHMANN is a 83 y.o. female with hypothyroidism, osteoarthritis who presents for evaluation of Abdominal pain,  CT finding cholelithiasis  #Abdominal pain  #Dyspepsia #Cholelithiasis   Patient is a vegetarian and her diet is high in fruits and vegetables which can lead to fermentation and abdominal discomfort   Bowel movements are more on constipation side and hence this could be IBS constipation type  Recently had a negative upper endoscopy and a CT abdomen pelvis demonstrating cholelithiasis and diverticulosis  - Explained presumed etiology of IBS symptoms. Patient was counseled about the benefit of implementing a low FODMAP to improve symptoms and recurrent episodes. A dietary list was provided to the patient. Also, the patient was counseled about the benefit of avoiding stressing situations and potential environmental triggers leading to symptomatology.  - Start IBGard 1 tablet every 8-12 hours as needed -Continue Linzess  72 mcg qday and uptitrate as tolerated  -Start taking Miralax  1 capful every day -BEANO before food - Given patient continues to have pain and if above  does not help she can discuss options with the surgeons regarding cholelithiasis   All questions were answered.      Lucio Litsey Faizan Kory Panjwani, MD Gastroenterology and Hepatology [facility] Gastroenterology   This chart has been completed using [facility] Dictation software, and while attempts have been made to ensure accuracy , certain words and phrases may not be transcribed as intended

## 2023-10-18 NOTE — Patient Instructions (Signed)
 It was very nice to meet you today, as dicussed with will plan for the following :  1) IB guard 1-2 times daily  2) Gas X as needed  3) linzess  , if it helps please let us  know  4) Miralax  daily  5) low FODMAP diet

## 2023-10-23 ENCOUNTER — Encounter (INDEPENDENT_AMBULATORY_CARE_PROVIDER_SITE_OTHER): Payer: Self-pay | Admitting: Gastroenterology

## 2023-10-23 DIAGNOSIS — K581 Irritable bowel syndrome with constipation: Secondary | ICD-10-CM

## 2023-10-23 MED ORDER — LINACLOTIDE 72 MCG PO CAPS: 72.0000 ug | ORAL_CAPSULE | Freq: Every day | ORAL | 1 refills | Status: DC

## 2023-10-25 ENCOUNTER — Telehealth (INDEPENDENT_AMBULATORY_CARE_PROVIDER_SITE_OTHER): Payer: Self-pay | Admitting: Gastroenterology

## 2023-10-25 ENCOUNTER — Other Ambulatory Visit (INDEPENDENT_AMBULATORY_CARE_PROVIDER_SITE_OTHER): Payer: Self-pay

## 2023-10-25 DIAGNOSIS — L719 Rosacea, unspecified: Secondary | ICD-10-CM | POA: Diagnosis not present

## 2023-10-25 DIAGNOSIS — K581 Irritable bowel syndrome with constipation: Secondary | ICD-10-CM

## 2023-10-25 DIAGNOSIS — Z85828 Personal history of other malignant neoplasm of skin: Secondary | ICD-10-CM | POA: Diagnosis not present

## 2023-10-25 MED ORDER — LINACLOTIDE 72 MCG PO CAPS: 72.0000 ug | ORAL_CAPSULE | Freq: Every day | ORAL | 1 refills | Status: AC

## 2023-10-25 MED ORDER — LINACLOTIDE 72 MCG PO CAPS: 72.0000 ug | ORAL_CAPSULE | Freq: Every day | ORAL | 1 refills | Status: DC

## 2023-10-25 NOTE — Telephone Encounter (Signed)
 Pt left voicemail needing 30 day supply of Linzess  sent to [facility].  [facility] but pt is wanting 30 days sent to [facility]. I originally re ordered the Linzess  for 90 days and realized I wrote she wanted 30 day supply when writing down the message. Returned call to pt to make sure she wanted 30 days supply and not 90. Pt verbally states she wants 30 days. Contacted [facility] and cancelled to 90 day supply

## 2023-10-30 ENCOUNTER — Ambulatory Visit (INDEPENDENT_AMBULATORY_CARE_PROVIDER_SITE_OTHER): Admitting: Gastroenterology

## 2023-11-15 ENCOUNTER — Encounter (INDEPENDENT_AMBULATORY_CARE_PROVIDER_SITE_OTHER): Payer: Self-pay | Admitting: Gastroenterology

## 2023-11-16 ENCOUNTER — Encounter: Payer: Medicare HMO | Admitting: Nurse Practitioner

## 2023-11-21 ENCOUNTER — Telehealth (INDEPENDENT_AMBULATORY_CARE_PROVIDER_SITE_OTHER): Payer: Self-pay

## 2023-11-21 NOTE — Telephone Encounter (Signed)
 Patient has called back this morning stating she sent a message on 11/16/2023 on My Chart, and she has not heard back on this to see if she needs to continue the linzess , as she need to know whether to continue or not because she is due to have it refilled. Please advise.       Darice GORMAN Beams  P Rehman Clinical Pool (supporting Deatrice JULIANNA Dine, MD)5 days ago    Dr, Dine, I have been taking Linzess  once a day for 4 weeks, also IBGard 1 mid morning and 1 mid afternoon. I have diarrhea every morning and was wondering if this is normal. I do not know what to expect. Maybe I could try just the IBGard to see how that works? Thank you for your input, Suad

## 2023-11-23 ENCOUNTER — Ambulatory Visit: Admitting: "Endocrinology

## 2023-12-08 ENCOUNTER — Other Ambulatory Visit: Payer: Self-pay | Admitting: Family Medicine

## 2023-12-08 DIAGNOSIS — E063 Autoimmune thyroiditis: Secondary | ICD-10-CM

## 2024-01-19 ENCOUNTER — Ambulatory Visit (INDEPENDENT_AMBULATORY_CARE_PROVIDER_SITE_OTHER)

## 2024-01-19 VITALS — BP 112/57 | Ht 66.0 in | Wt 135.0 lb

## 2024-01-19 DIAGNOSIS — Z780 Asymptomatic menopausal state: Secondary | ICD-10-CM

## 2024-01-19 DIAGNOSIS — Z0000 Encounter for general adult medical examination without abnormal findings: Secondary | ICD-10-CM

## 2024-01-19 NOTE — Patient Instructions (Signed)
 Nancy Williams,  Thank you for taking the time for your Medicare Wellness Visit. I appreciate your continued commitment to your health goals. Please review the care plan we discussed, and feel free to reach out if I can assist you further.  Please note that Annual Wellness Visits do not include a physical exam. Some assessments may be limited, especially if the visit was conducted virtually. If needed, we may recommend an in-person follow-up with your provider.  Ongoing Care Seeing your primary care provider every 3 to 6 months helps us  monitor your health and provide consistent, personalized care.   1 year follow up for Medicare well visit: Thursday January 30, 2025 at 11:20 am with medicare wellness nurse in office  Referrals If a referral was made during today's visit and you haven't received any updates within two weeks, please contact the referred provider directly to check on the status.  Osteoporosis Screening An order was placed for you to have your Osteoporosis Screening. Call the number below to schedule that [facility]  [phone]   Recommended Screenings:  Health Maintenance  Topic Date Due   Flu Shot  09/01/2023   Osteoporosis screening with Bone Density Scan  10/13/2023   Medicare Annual Wellness Visit  11/14/2023   COVID-19 Vaccine (4 - 2025-26 season) 02/01/2048*   DTaP/Tdap/Td vaccine (3 - Td or Tdap) 02/13/2032   Pneumococcal Vaccine for age over 61  Completed   Meningitis B Vaccine  Aged Out   Zoster (Shingles) Vaccine  Discontinued  *Topic was postponed. The date shown is not the original due date.       01/13/2024    3:35 PM  Advanced Directives  Does Patient Have a Medical Advance Directive? Yes  Type of Estate Agent of [place];Living will  Copy of Healthcare Power of Attorney in Chart? Yes - validated most recent copy scanned in chart (See row information)    Vision: Annual vision screenings are recommended for early detection  of glaucoma, cataracts, and diabetic retinopathy. These exams can also reveal signs of chronic conditions such as diabetes and high blood pressure.  Dental: Annual dental screenings help detect early signs of oral cancer, gum disease, and other conditions linked to overall health, including heart disease and diabetes.  Please see the attached documents for additional preventive care recommendations.

## 2024-01-19 NOTE — Progress Notes (Cosign Needed Addendum)
 "  HM Addressed: DEXA ordered Patient scheduled to see primary care provider. She requested the appointment be after the holidays. Appointment made for a follow up and to address blood pressure concerns  Chief Complaint  Patient presents with   Medicare Wellness     Subjective:   Nancy Williams is a 83 y.o. female who presents for a Medicare Annual Wellness Visit.  Visit info / Clinical Intake: Medicare Wellness Visit Type:: Subsequent Annual Wellness Visit Persons participating in visit and providing information:: patient Medicare Wellness Visit Mode:: Video Since this visit was completed virtually, some vitals may be partially provided or unavailable. Missing vitals are due to the limitations of the virtual format.: Documented vitals are patient reported If Telephone or Video please confirm:: I connected with patient using audio/video enable telemedicine. I verified patient identity with two identifiers, discussed telehealth limitations, and patient agreed to proceed. Patient Location:: home Provider Location:: office Interpreter Needed?: No Pre-visit prep was completed: yes AWV questionnaire completed by patient prior to visit?: yes Date:: 01/13/24 Living arrangements:: (!) (Patient-Rptd) lives alone Patient's Overall Health Status Rating: (Patient-Rptd) very good Typical amount of pain: (Patient-Rptd) none Does pain affect daily life?: (Patient-Rptd) no Are you currently prescribed opioids?: no  Dietary Habits and Nutritional Risks How many meals a day?: (Patient-Rptd) 3 Eats fruit and vegetables daily?: (Patient-Rptd) yes Most meals are obtained by: (Patient-Rptd) preparing own meals In the last 2 weeks, have you had any of the following?: none Diabetic:: no  Functional Status Activities of Daily Living (to include ambulation/medication): (Patient-Rptd) Independent Ambulation: (Patient-Rptd) Independent Medication Administration: (Patient-Rptd) Independent Home Management  (perform basic housework or laundry): (Patient-Rptd) Independent Manage your own finances?: (Patient-Rptd) yes Primary transportation is: (Patient-Rptd) family / friends Concerns about vision?: no *vision screening is required for WTM* Concerns about hearing?: no  Fall Screening Falls in the past year?: (Patient-Rptd) 0 Number of falls in past year: 0 Was there an injury with Fall?: 0 Fall Risk Category Calculator: 0 Patient Fall Risk Level: Low Fall Risk  Fall Risk Patient at Risk for Falls Due to: No Fall Risks Fall risk Follow up: Falls evaluation completed; Education provided; Falls prevention discussed  Home and Transportation Safety: All rugs have non-skid backing?: (Patient-Rptd) yes All stairs or steps have railings?: (Patient-Rptd) yes Grab bars in the bathtub or shower?: (Patient-Rptd) yes Have non-skid surface in bathtub or shower?: (Patient-Rptd) yes Good home lighting?: (Patient-Rptd) yes Regular seat belt use?: (Patient-Rptd) yes Hospital stays in the last year:: (Patient-Rptd) no  Cognitive Assessment Difficulty concentrating, remembering, or making decisions? : (Patient-Rptd) no Will 6CIT or Mini Cog be Completed: yes What year is it?: 0 points What month is it?: 0 points Give patient an address phrase to remember (5 components): [address] [place] About what time is it?: 0 points Count backwards from 20 to 1: 0 points Say the months of the year in reverse: 0 points Repeat the address phrase from earlier: 0 points 6 CIT Score: 0 points  Advance Directives (For Healthcare) Does Patient Have a Medical Advance Directive?: Yes Does patient want to make changes to medical advance directive?: No - Patient declined Type of Advance Directive: Healthcare Power of [place]; Living will Copy of Healthcare Power of Attorney in Chart?: Yes - validated most recent copy scanned in chart (See row information) Copy of Living Will in Chart?: Yes - validated most recent  copy scanned in chart (See row information)  Reviewed/Updated  Reviewed/Updated: Reviewed All (Medical, Surgical, Family, Medications, Allergies,  Care Teams, Patient Goals)    Allergies (verified) Patient has no known allergies.   Current Medications (verified) Outpatient Encounter Medications as of 01/19/2024  Medication Sig   b complex vitamins tablet Take 1 tablet by mouth daily.   Calcium Carb-Cholecalciferol (CALCIUM 500+D3 PO) Take 1 tablet by mouth daily.   [employer]S OMEGA-3 KRILL OIL PO Take 1 capsule by mouth daily.   Digestive Enzymes (DIGESTIVE SUPPORT PO) Take 1 capsule by mouth daily.   levothyroxine  (SYNTHROID ) 50 MCG tablet TAKE 1 TABLET DAILY BEFORE BREAKFAST   linaclotide  (LINZESS ) 72 MCG capsule Take 1 capsule (72 mcg total) by mouth daily before breakfast.   Magnesium Glycinate 120 MG CAPS Take 1 capsule by mouth daily.   Multiple Vitamins-Minerals (PRESERVISION AREDS 2) CAPS Take 1 capsule by mouth in the morning and at bedtime.   omeprazole  (PRILOSEC) 40 MG capsule Take 1 capsule (40 mg total) by mouth daily.   sucralfate  (CARAFATE ) 1 g tablet Take 1 tablet (1 g total) by mouth 3 (three) times daily as needed.   VITAMIN D-VITAMIN K PO Take 1 capsule by mouth daily.   No facility-administered encounter medications on file as of 01/19/2024.    History: Past Medical History:  Diagnosis Date   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    Arrhythmia 2021   Cataract    Hyperlipidemia    Hypothyroidism    Hashimoto's per [facility] New Patient Packet    Neuromuscular disorder (HCC)    Neuropathy    Osteoarthritis    Per patient at New patient appointment    Seasonal allergies    Vitamin D deficiency    Past Surgical History:  Procedure Laterality Date   ESOPHAGOGASTRODUODENOSCOPY N/A 09/26/2023   Procedure: EGD (ESOPHAGOGASTRODUODENOSCOPY);  Surgeon: Cinderella Deatrice FALCON, MD;  Location[facility];  Service: Endoscopy;  Laterality: N/A;  9:45 am,  asa 1-2   LEFT HEART CATH AND CORONARY ANGIOGRAPHY N/A 08/10/2023   Procedure: LEFT HEART CATH AND CORONARY ANGIOGRAPHY;  Surgeon: Jordan, Peter M, MD;  Location: [facility] INVASIVE CV LAB;  Service: Cardiovascular;  Laterality: N/A;   No previous surgery     Family History  Problem Relation Age of Onset   Heart disease Mother        Mitral valve   Heart failure Mother    Heart disease Father    Prostate cancer Father    Heart attack Father        2 heart attacks   Heart disease Son    Diabetes Son    Prostate cancer Son    Thyroid  disease Son    Hashimoto's thyroiditis Daughter    Social History   Occupational History   Not on file  Tobacco Use   Smoking status: Never   Smokeless tobacco: Never  Vaping Use   Vaping status: Never Used  Substance and Sexual Activity   Alcohol use: Not Currently    Comment: occasional -maybe a couple times per year-glass of wine   Drug use: Never   Sexual activity: Not Currently    Birth control/protection: Abstinence, Post-menopausal   Tobacco Counseling Counseling given: Yes  SDOH Screenings   Food Insecurity: No Food Insecurity (01/13/2024)  Housing: Low Risk (01/13/2024)  Transportation Needs: No Transportation Needs (01/13/2024)  Utilities: Not At Risk (01/19/2024)  Depression (PHQ2-9): Low Risk (01/19/2024)  Financial Resource Strain: Low Risk (01/13/2024)  Physical Activity: Sufficiently Active (01/13/2024)  Social Connections: Moderately Isolated (01/13/2024)  Stress: No Stress Concern Present (01/13/2024)  Tobacco  Use: Low Risk (01/19/2024)  Health Literacy: Adequate Health Literacy (01/19/2024)   See flowsheets for full screening details  Depression Screen PHQ 2 & 9 Depression Scale- Over the past 2 weeks, how often have you been bothered by any of the following problems? Little interest or pleasure in doing things: 0 Feeling down, depressed, or hopeless (PHQ Adolescent also includes...irritable): 0 PHQ-2 Total Score:  0 Trouble falling or staying asleep, or sleeping too much: 0 Feeling tired or having little energy: 0 Poor appetite or overeating (PHQ Adolescent also includes...weight loss): 0 Feeling bad about yourself - or that you are a failure or have let yourself or your family down: 0 Trouble concentrating on things, such as reading the newspaper or watching television (PHQ Adolescent also includes...like school work): 0 Moving or speaking so slowly that other people could have noticed. Or the opposite - being so fidgety or restless that you have been moving around a lot more than usual: 0 Thoughts that you would be better off dead, or of hurting yourself in some way: 0 PHQ-9 Total Score: 0 If you checked off any problems, how difficult have these problems made it for you to do your work, take care of things at home, or get along with other people?: Not difficult at all  Depression Treatment Depression Interventions/Treatment : EYV7-0 Score <4 Follow-up Not Indicated     Goals Addressed               This Visit's Progress     I'd like to keep healthy (pt-stated)               Objective:    Today's Vitals   01/19/24 1150  BP: (!) 112/57  Weight: 135 lb (61.2 kg)  Height: 5' 6 (1.676 m)   Body mass index is 21.79 kg/m.  Hearing/Vision screen Hearing Screening - Comments:: Patient denies any hearing difficulties.   Vision Screening - Comments:: Wears rx glasses - up to date with routine eye exams with  Oneil kawasaki Immunizations and Health Maintenance Health Maintenance  Topic Date Due   Influenza Vaccine  09/01/2023   Bone Density Scan  10/13/2023   COVID-19 Vaccine (4 - 2025-26 season) 02/01/2048 (Originally 10/02/2023)   Medicare Annual Wellness (AWV)  01/18/2025   DTaP/Tdap/Td (3 - Td or Tdap) 02/13/2032   Pneumococcal Vaccine: 50+ Years  Completed   Meningococcal B Vaccine  Aged Out   Zoster Vaccines- Shingrix  Discontinued        Assessment/Plan:  This is a  routine wellness examination for Nancy Williams.  Patient Care Team: Cook, Jayce G, DO as PCP - General (Family Medicine) Eda Baxter, MD as Referring Physician (Dermatology) Kawasaki Oneil, DO (Optometry) Karis Clunes, MD as Consulting Physician (Otolaryngology) Tobie Tonita POUR, DO as Consulting Physician (Neurology) Debera Jayson MATSU, MD as Consulting Physician (Cardiology) Ahmed, Deatrice FALCON, MD as Consulting Physician (Gastroenterology) Obadiah Birmingham, MD as Consulting Physician (Endocrinology) Jarold Mayo, MD as Consulting Physician (Ophthalmology)  I have personally reviewed and noted the following in the patients chart:   Medical and social history Use of alcohol, tobacco or illicit drugs  Current medications and supplements including opioid prescriptions. Functional ability and status Nutritional status Physical activity Advanced directives List of other physicians Hospitalizations, surgeries, and ER visits in previous 12 months Vitals Screenings to include cognitive, depression, and falls Referrals and appointments  Orders Placed This Encounter  Procedures   DG Bone Density    Standing Status:   Future  Expiration Date:   01/18/2025    Reason for Exam (SYMPTOM  OR DIAGNOSIS REQUIRED):   post menopausal estrogen deficient    Preferred imaging location?:   [facility]   In addition, I have reviewed and discussed with patient certain preventive protocols, quality metrics, and best practice recommendations. A written personalized care plan for preventive services as well as general preventive health recommendations were provided to patient.   Ladye Macnaughton, CMA   01/19/2024   Return on Thursday January 30, 2025 at 11:20 am, for your yearly Medicare Wellness Visit in person.  After Visit Summary: (MyChart) Due to this being a telephonic visit, the after visit summary with patients personalized plan was offered to patient via MyChart    "

## 2024-02-13 ENCOUNTER — Ambulatory Visit: Admitting: Family Medicine

## 2024-02-19 ENCOUNTER — Ambulatory Visit (HOSPITAL_COMMUNITY)
Admission: RE | Admit: 2024-02-19 | Discharge: 2024-02-19 | Disposition: A | Source: Ambulatory Visit | Attending: Family Medicine | Admitting: Family Medicine

## 2024-02-19 DIAGNOSIS — Z780 Asymptomatic menopausal state: Secondary | ICD-10-CM | POA: Diagnosis present

## 2024-02-20 ENCOUNTER — Encounter: Payer: Self-pay | Admitting: Family Medicine

## 2024-02-20 ENCOUNTER — Ambulatory Visit: Payer: Self-pay | Admitting: Family Medicine

## 2024-02-20 NOTE — Progress Notes (Signed)
 Called pt and let her know - pt stated she would like to think medication over and give call back regarding medicine

## 2024-03-05 ENCOUNTER — Other Ambulatory Visit: Payer: Self-pay

## 2024-03-05 DIAGNOSIS — E063 Autoimmune thyroiditis: Secondary | ICD-10-CM

## 2024-03-05 MED ORDER — LEVOTHYROXINE SODIUM 50 MCG PO TABS: 50.0000 ug | ORAL_TABLET | Freq: Every day | ORAL | 1 refills | Status: AC

## 2024-03-06 ENCOUNTER — Encounter (INDEPENDENT_AMBULATORY_CARE_PROVIDER_SITE_OTHER): Payer: Self-pay | Admitting: Gastroenterology

## 2025-01-30 ENCOUNTER — Ambulatory Visit
